# Patient Record
Sex: Male | Born: 1950
Health system: Southern US, Community
[De-identification: ages and names within clinical notes are randomized; demographics above are authoritative.]

## PROBLEM LIST (undated history)

## (undated) DIAGNOSIS — B182 Chronic viral hepatitis C: Secondary | ICD-10-CM

## (undated) DIAGNOSIS — I10 Essential (primary) hypertension: Secondary | ICD-10-CM

## (undated) DIAGNOSIS — N281 Cyst of kidney, acquired: Secondary | ICD-10-CM

## (undated) DIAGNOSIS — K7689 Other specified diseases of liver: Secondary | ICD-10-CM

## (undated) DIAGNOSIS — M1990 Unspecified osteoarthritis, unspecified site: Secondary | ICD-10-CM

## (undated) HISTORY — DX: Unspecified osteoarthritis, unspecified site: M19.90

## (undated) HISTORY — DX: Other specified diseases of liver: K76.89

## (undated) HISTORY — DX: Chronic viral hepatitis C: B18.2

## (undated) HISTORY — DX: Cyst of kidney, acquired: N28.1

## (undated) HISTORY — DX: Essential (primary) hypertension: I10

---

## 2010-09-23 HISTORY — PX: COLON SURGERY: SHX602

## 2013-12-01 DIAGNOSIS — K7689 Other specified diseases of liver: Secondary | ICD-10-CM

## 2013-12-01 DIAGNOSIS — N281 Cyst of kidney, acquired: Secondary | ICD-10-CM

## 2013-12-01 HISTORY — DX: Cyst of kidney, acquired: N28.1

## 2013-12-01 HISTORY — DX: Other specified diseases of liver: K76.89

## 2014-06-29 DIAGNOSIS — M1712 Unilateral primary osteoarthritis, left knee: Secondary | ICD-10-CM | POA: Diagnosis not present

## 2014-06-29 DIAGNOSIS — E569 Vitamin deficiency, unspecified: Secondary | ICD-10-CM | POA: Diagnosis not present

## 2014-07-06 DIAGNOSIS — E785 Hyperlipidemia, unspecified: Secondary | ICD-10-CM | POA: Diagnosis present

## 2014-07-06 DIAGNOSIS — B182 Chronic viral hepatitis C: Secondary | ICD-10-CM | POA: Diagnosis present

## 2014-07-06 DIAGNOSIS — N182 Chronic kidney disease, stage 2 (mild): Secondary | ICD-10-CM | POA: Diagnosis present

## 2014-07-06 DIAGNOSIS — E569 Vitamin deficiency, unspecified: Secondary | ICD-10-CM | POA: Diagnosis present

## 2014-07-06 DIAGNOSIS — M1712 Unilateral primary osteoarthritis, left knee: Secondary | ICD-10-CM | POA: Diagnosis not present

## 2014-07-06 DIAGNOSIS — Z86010 Personal history of colonic polyps: Secondary | ICD-10-CM | POA: Diagnosis not present

## 2014-07-06 DIAGNOSIS — I129 Hypertensive chronic kidney disease with stage 1 through stage 4 chronic kidney disease, or unspecified chronic kidney disease: Secondary | ICD-10-CM | POA: Diagnosis present

## 2014-07-06 HISTORY — PX: REPLACEMENT TOTAL KNEE: SUR1224

## 2014-07-09 DIAGNOSIS — E569 Vitamin deficiency, unspecified: Secondary | ICD-10-CM | POA: Diagnosis not present

## 2014-07-09 DIAGNOSIS — M1712 Unilateral primary osteoarthritis, left knee: Secondary | ICD-10-CM | POA: Diagnosis not present

## 2014-07-09 DIAGNOSIS — R269 Unspecified abnormalities of gait and mobility: Secondary | ICD-10-CM | POA: Diagnosis not present

## 2014-07-09 DIAGNOSIS — E785 Hyperlipidemia, unspecified: Secondary | ICD-10-CM | POA: Diagnosis not present

## 2014-07-09 DIAGNOSIS — I10 Essential (primary) hypertension: Secondary | ICD-10-CM | POA: Diagnosis not present

## 2014-07-09 DIAGNOSIS — N182 Chronic kidney disease, stage 2 (mild): Secondary | ICD-10-CM | POA: Diagnosis not present

## 2014-07-09 DIAGNOSIS — Z96652 Presence of left artificial knee joint: Secondary | ICD-10-CM | POA: Diagnosis not present

## 2014-07-09 DIAGNOSIS — D126 Benign neoplasm of colon, unspecified: Secondary | ICD-10-CM | POA: Diagnosis not present

## 2014-07-09 DIAGNOSIS — Z471 Aftercare following joint replacement surgery: Secondary | ICD-10-CM | POA: Diagnosis not present

## 2014-07-09 DIAGNOSIS — M2450 Contracture, unspecified joint: Secondary | ICD-10-CM | POA: Diagnosis not present

## 2014-07-09 DIAGNOSIS — B182 Chronic viral hepatitis C: Secondary | ICD-10-CM | POA: Diagnosis not present

## 2014-07-10 DIAGNOSIS — B182 Chronic viral hepatitis C: Secondary | ICD-10-CM | POA: Diagnosis not present

## 2014-07-10 DIAGNOSIS — Z96652 Presence of left artificial knee joint: Secondary | ICD-10-CM | POA: Diagnosis not present

## 2014-07-10 DIAGNOSIS — Z471 Aftercare following joint replacement surgery: Secondary | ICD-10-CM | POA: Diagnosis not present

## 2014-07-10 DIAGNOSIS — R269 Unspecified abnormalities of gait and mobility: Secondary | ICD-10-CM | POA: Diagnosis not present

## 2014-07-10 DIAGNOSIS — M1712 Unilateral primary osteoarthritis, left knee: Secondary | ICD-10-CM | POA: Diagnosis not present

## 2014-07-10 DIAGNOSIS — D126 Benign neoplasm of colon, unspecified: Secondary | ICD-10-CM | POA: Diagnosis not present

## 2014-07-11 DIAGNOSIS — R269 Unspecified abnormalities of gait and mobility: Secondary | ICD-10-CM | POA: Diagnosis not present

## 2014-07-11 DIAGNOSIS — Z96652 Presence of left artificial knee joint: Secondary | ICD-10-CM | POA: Diagnosis not present

## 2014-07-11 DIAGNOSIS — M1712 Unilateral primary osteoarthritis, left knee: Secondary | ICD-10-CM | POA: Diagnosis not present

## 2014-07-11 DIAGNOSIS — B182 Chronic viral hepatitis C: Secondary | ICD-10-CM | POA: Diagnosis not present

## 2014-07-11 DIAGNOSIS — Z471 Aftercare following joint replacement surgery: Secondary | ICD-10-CM | POA: Diagnosis not present

## 2014-07-11 DIAGNOSIS — D126 Benign neoplasm of colon, unspecified: Secondary | ICD-10-CM | POA: Diagnosis not present

## 2014-07-13 DIAGNOSIS — D126 Benign neoplasm of colon, unspecified: Secondary | ICD-10-CM | POA: Diagnosis not present

## 2014-07-13 DIAGNOSIS — M1712 Unilateral primary osteoarthritis, left knee: Secondary | ICD-10-CM | POA: Diagnosis not present

## 2014-07-13 DIAGNOSIS — Z96652 Presence of left artificial knee joint: Secondary | ICD-10-CM | POA: Diagnosis not present

## 2014-07-13 DIAGNOSIS — Z471 Aftercare following joint replacement surgery: Secondary | ICD-10-CM | POA: Diagnosis not present

## 2014-07-13 DIAGNOSIS — B182 Chronic viral hepatitis C: Secondary | ICD-10-CM | POA: Diagnosis not present

## 2014-07-13 DIAGNOSIS — R269 Unspecified abnormalities of gait and mobility: Secondary | ICD-10-CM | POA: Diagnosis not present

## 2014-07-14 DIAGNOSIS — Z96652 Presence of left artificial knee joint: Secondary | ICD-10-CM | POA: Diagnosis not present

## 2014-07-14 DIAGNOSIS — D126 Benign neoplasm of colon, unspecified: Secondary | ICD-10-CM | POA: Diagnosis not present

## 2014-07-14 DIAGNOSIS — M1712 Unilateral primary osteoarthritis, left knee: Secondary | ICD-10-CM | POA: Diagnosis not present

## 2014-07-14 DIAGNOSIS — Z471 Aftercare following joint replacement surgery: Secondary | ICD-10-CM | POA: Diagnosis not present

## 2014-07-14 DIAGNOSIS — B182 Chronic viral hepatitis C: Secondary | ICD-10-CM | POA: Diagnosis not present

## 2014-07-14 DIAGNOSIS — R269 Unspecified abnormalities of gait and mobility: Secondary | ICD-10-CM | POA: Diagnosis not present

## 2014-07-16 DIAGNOSIS — B182 Chronic viral hepatitis C: Secondary | ICD-10-CM | POA: Diagnosis not present

## 2014-07-16 DIAGNOSIS — D126 Benign neoplasm of colon, unspecified: Secondary | ICD-10-CM | POA: Diagnosis not present

## 2014-07-16 DIAGNOSIS — M1712 Unilateral primary osteoarthritis, left knee: Secondary | ICD-10-CM | POA: Diagnosis not present

## 2014-07-16 DIAGNOSIS — Z96652 Presence of left artificial knee joint: Secondary | ICD-10-CM | POA: Diagnosis not present

## 2014-07-16 DIAGNOSIS — Z471 Aftercare following joint replacement surgery: Secondary | ICD-10-CM | POA: Diagnosis not present

## 2014-07-16 DIAGNOSIS — R269 Unspecified abnormalities of gait and mobility: Secondary | ICD-10-CM | POA: Diagnosis not present

## 2014-07-17 DIAGNOSIS — Z471 Aftercare following joint replacement surgery: Secondary | ICD-10-CM | POA: Diagnosis not present

## 2014-07-17 DIAGNOSIS — B182 Chronic viral hepatitis C: Secondary | ICD-10-CM | POA: Diagnosis not present

## 2014-07-17 DIAGNOSIS — R269 Unspecified abnormalities of gait and mobility: Secondary | ICD-10-CM | POA: Diagnosis not present

## 2014-07-17 DIAGNOSIS — Z96652 Presence of left artificial knee joint: Secondary | ICD-10-CM | POA: Diagnosis not present

## 2014-07-17 DIAGNOSIS — D126 Benign neoplasm of colon, unspecified: Secondary | ICD-10-CM | POA: Diagnosis not present

## 2014-07-17 DIAGNOSIS — M1712 Unilateral primary osteoarthritis, left knee: Secondary | ICD-10-CM | POA: Diagnosis not present

## 2014-07-20 DIAGNOSIS — B182 Chronic viral hepatitis C: Secondary | ICD-10-CM | POA: Diagnosis not present

## 2014-07-20 DIAGNOSIS — R269 Unspecified abnormalities of gait and mobility: Secondary | ICD-10-CM | POA: Diagnosis not present

## 2014-07-20 DIAGNOSIS — M1712 Unilateral primary osteoarthritis, left knee: Secondary | ICD-10-CM | POA: Diagnosis not present

## 2014-07-20 DIAGNOSIS — Z471 Aftercare following joint replacement surgery: Secondary | ICD-10-CM | POA: Diagnosis not present

## 2014-07-20 DIAGNOSIS — D126 Benign neoplasm of colon, unspecified: Secondary | ICD-10-CM | POA: Diagnosis not present

## 2014-07-20 DIAGNOSIS — Z96652 Presence of left artificial knee joint: Secondary | ICD-10-CM | POA: Diagnosis not present

## 2014-07-22 DIAGNOSIS — M1712 Unilateral primary osteoarthritis, left knee: Secondary | ICD-10-CM | POA: Diagnosis not present

## 2014-07-22 DIAGNOSIS — B182 Chronic viral hepatitis C: Secondary | ICD-10-CM | POA: Diagnosis not present

## 2014-07-22 DIAGNOSIS — Z96652 Presence of left artificial knee joint: Secondary | ICD-10-CM | POA: Diagnosis not present

## 2014-07-22 DIAGNOSIS — D126 Benign neoplasm of colon, unspecified: Secondary | ICD-10-CM | POA: Diagnosis not present

## 2014-07-22 DIAGNOSIS — Z471 Aftercare following joint replacement surgery: Secondary | ICD-10-CM | POA: Diagnosis not present

## 2014-07-22 DIAGNOSIS — R269 Unspecified abnormalities of gait and mobility: Secondary | ICD-10-CM | POA: Diagnosis not present

## 2014-07-23 DIAGNOSIS — Z471 Aftercare following joint replacement surgery: Secondary | ICD-10-CM | POA: Diagnosis not present

## 2014-07-23 DIAGNOSIS — Z96652 Presence of left artificial knee joint: Secondary | ICD-10-CM | POA: Diagnosis not present

## 2014-07-23 DIAGNOSIS — M1712 Unilateral primary osteoarthritis, left knee: Secondary | ICD-10-CM | POA: Diagnosis not present

## 2014-07-23 DIAGNOSIS — R269 Unspecified abnormalities of gait and mobility: Secondary | ICD-10-CM | POA: Diagnosis not present

## 2014-07-23 DIAGNOSIS — D126 Benign neoplasm of colon, unspecified: Secondary | ICD-10-CM | POA: Diagnosis not present

## 2014-07-23 DIAGNOSIS — B182 Chronic viral hepatitis C: Secondary | ICD-10-CM | POA: Diagnosis not present

## 2014-07-24 DIAGNOSIS — M1712 Unilateral primary osteoarthritis, left knee: Secondary | ICD-10-CM | POA: Diagnosis not present

## 2014-07-24 DIAGNOSIS — B182 Chronic viral hepatitis C: Secondary | ICD-10-CM | POA: Diagnosis not present

## 2014-07-24 DIAGNOSIS — R269 Unspecified abnormalities of gait and mobility: Secondary | ICD-10-CM | POA: Diagnosis not present

## 2014-07-24 DIAGNOSIS — Z96652 Presence of left artificial knee joint: Secondary | ICD-10-CM | POA: Diagnosis not present

## 2014-07-24 DIAGNOSIS — Z471 Aftercare following joint replacement surgery: Secondary | ICD-10-CM | POA: Diagnosis not present

## 2014-07-24 DIAGNOSIS — D126 Benign neoplasm of colon, unspecified: Secondary | ICD-10-CM | POA: Diagnosis not present

## 2014-07-28 DIAGNOSIS — D126 Benign neoplasm of colon, unspecified: Secondary | ICD-10-CM | POA: Diagnosis not present

## 2014-07-28 DIAGNOSIS — B182 Chronic viral hepatitis C: Secondary | ICD-10-CM | POA: Diagnosis not present

## 2014-07-28 DIAGNOSIS — Z471 Aftercare following joint replacement surgery: Secondary | ICD-10-CM | POA: Diagnosis not present

## 2014-07-28 DIAGNOSIS — M1712 Unilateral primary osteoarthritis, left knee: Secondary | ICD-10-CM | POA: Diagnosis not present

## 2014-07-28 DIAGNOSIS — R269 Unspecified abnormalities of gait and mobility: Secondary | ICD-10-CM | POA: Diagnosis not present

## 2014-07-28 DIAGNOSIS — Z96652 Presence of left artificial knee joint: Secondary | ICD-10-CM | POA: Diagnosis not present

## 2014-07-30 DIAGNOSIS — R269 Unspecified abnormalities of gait and mobility: Secondary | ICD-10-CM | POA: Diagnosis not present

## 2014-07-30 DIAGNOSIS — B182 Chronic viral hepatitis C: Secondary | ICD-10-CM | POA: Diagnosis not present

## 2014-07-30 DIAGNOSIS — Z96652 Presence of left artificial knee joint: Secondary | ICD-10-CM | POA: Diagnosis not present

## 2014-07-30 DIAGNOSIS — Z471 Aftercare following joint replacement surgery: Secondary | ICD-10-CM | POA: Diagnosis not present

## 2014-07-30 DIAGNOSIS — D126 Benign neoplasm of colon, unspecified: Secondary | ICD-10-CM | POA: Diagnosis not present

## 2014-07-30 DIAGNOSIS — M1712 Unilateral primary osteoarthritis, left knee: Secondary | ICD-10-CM | POA: Diagnosis not present

## 2014-07-31 DIAGNOSIS — B182 Chronic viral hepatitis C: Secondary | ICD-10-CM | POA: Diagnosis not present

## 2014-07-31 DIAGNOSIS — M1712 Unilateral primary osteoarthritis, left knee: Secondary | ICD-10-CM | POA: Diagnosis not present

## 2014-07-31 DIAGNOSIS — D126 Benign neoplasm of colon, unspecified: Secondary | ICD-10-CM | POA: Diagnosis not present

## 2014-07-31 DIAGNOSIS — Z471 Aftercare following joint replacement surgery: Secondary | ICD-10-CM | POA: Diagnosis not present

## 2014-07-31 DIAGNOSIS — Z96652 Presence of left artificial knee joint: Secondary | ICD-10-CM | POA: Diagnosis not present

## 2014-07-31 DIAGNOSIS — R269 Unspecified abnormalities of gait and mobility: Secondary | ICD-10-CM | POA: Diagnosis not present

## 2014-08-27 DIAGNOSIS — Z01818 Encounter for other preprocedural examination: Secondary | ICD-10-CM | POA: Diagnosis not present

## 2014-08-27 DIAGNOSIS — Z131 Encounter for screening for diabetes mellitus: Secondary | ICD-10-CM | POA: Diagnosis not present

## 2015-01-04 DIAGNOSIS — Z139 Encounter for screening, unspecified: Secondary | ICD-10-CM | POA: Diagnosis not present

## 2015-01-04 DIAGNOSIS — Z96652 Presence of left artificial knee joint: Secondary | ICD-10-CM | POA: Diagnosis not present

## 2015-01-04 DIAGNOSIS — I10 Essential (primary) hypertension: Secondary | ICD-10-CM | POA: Diagnosis not present

## 2015-04-08 ENCOUNTER — Ambulatory Visit: Payer: Self-pay | Admitting: Student

## 2015-04-08 ENCOUNTER — Encounter: Payer: Self-pay | Admitting: Family Medicine

## 2015-04-08 ENCOUNTER — Ambulatory Visit (INDEPENDENT_AMBULATORY_CARE_PROVIDER_SITE_OTHER): Payer: Medicare Other | Admitting: Family Medicine

## 2015-04-08 VITALS — BP 172/99 | HR 92 | Temp 98.1°F | Ht 70.0 in | Wt 159.3 lb

## 2015-04-08 DIAGNOSIS — R1010 Upper abdominal pain, unspecified: Secondary | ICD-10-CM

## 2015-04-08 DIAGNOSIS — M129 Arthropathy, unspecified: Secondary | ICD-10-CM

## 2015-04-08 DIAGNOSIS — F1010 Alcohol abuse, uncomplicated: Secondary | ICD-10-CM | POA: Diagnosis not present

## 2015-04-08 DIAGNOSIS — I10 Essential (primary) hypertension: Secondary | ICD-10-CM

## 2015-04-08 DIAGNOSIS — M1710 Unilateral primary osteoarthritis, unspecified knee: Secondary | ICD-10-CM

## 2015-04-08 LAB — CBC WITH DIFFERENTIAL/PLATELET
BASOS ABS: 0 10*3/uL (ref 0.0–0.1)
BASOS PCT: 0 % (ref 0–1)
Eosinophils Absolute: 0.1 10*3/uL (ref 0.0–0.7)
Eosinophils Relative: 1 % (ref 0–5)
HCT: 40.7 % (ref 39.0–52.0)
HEMOGLOBIN: 14.6 g/dL (ref 13.0–17.0)
Lymphocytes Relative: 22 % (ref 12–46)
Lymphs Abs: 1.4 10*3/uL (ref 0.7–4.0)
MCH: 37.1 pg — ABNORMAL HIGH (ref 26.0–34.0)
MCHC: 35.9 g/dL (ref 30.0–36.0)
MCV: 103.3 fL — ABNORMAL HIGH (ref 78.0–100.0)
MONO ABS: 0.6 10*3/uL (ref 0.1–1.0)
MPV: 10.5 fL (ref 8.6–12.4)
Monocytes Relative: 10 % (ref 3–12)
NEUTROS ABS: 4.2 10*3/uL (ref 1.7–7.7)
NEUTROS PCT: 67 % (ref 43–77)
Platelets: 249 10*3/uL (ref 150–400)
RBC: 3.94 MIL/uL — AB (ref 4.22–5.81)
RDW: 13.1 % (ref 11.5–15.5)
WBC: 6.3 10*3/uL (ref 4.0–10.5)

## 2015-04-08 LAB — TSH: TSH: 0.964 u[IU]/mL (ref 0.350–4.500)

## 2015-04-08 NOTE — Progress Notes (Signed)
HPI:  Patient presents today for a new patient appointment to establish general primary care, also to discuss some "stomach pain".  Abdominal Pain: "Hunger pain" that has been going on for the last couple weeks. Pain is in upper abdomen. Pain is worse in morning and relieved with food. Pepto sometimes helps. He has noticed an unintentional 20 lb weight loss since March. No dark or bloody stool. Denies fever, nausea, vomiting, diarrhea, constipation.  Hypertension Patient has uncontrolled arterial hypertension. Blood pressure is not well controlled at home. He is exercising and is not adherent to a low-salt diet. He currently takes Losartan for blood pressure but he cannot remember the dose. He did not take his medication today but otherwise he claims he is compliant. Cardiac symptoms: none. Patient denies: chest pain, chest pressure/discomfort, claudication, dyspnea and exertional chest pressure/discomfort. Cardiovascular risk factors: advanced age (older than 59 for men, 55 for women), hypertension and male gender. Use of agents associated with hypertension: none. History of target organ damage: none.   ROS: Review of Systems  Constitutional: Positive for weight loss and diaphoresis. Negative for fever, chills and malaise/fatigue.  HENT: Negative for hearing loss.   Eyes: Negative for blurred vision.  Respiratory: Negative for cough, hemoptysis, shortness of breath and wheezing.   Cardiovascular: Negative for chest pain, palpitations and leg swelling.  Gastrointestinal: Positive for abdominal pain. Negative for heartburn, nausea, vomiting, diarrhea, constipation, blood in stool and melena.  Genitourinary: Negative for dysuria and frequency.  Musculoskeletal: Positive for joint pain. Negative for myalgias.  Skin: Negative for itching and rash.  Neurological: Negative for dizziness, seizures, weakness and headaches.  Psychiatric/Behavioral: Negative for depression and suicidal ideas.    Past Medical Hx:  - Hypertension - Arthritis in knees  Past Surgical Hx:  Past Surgical History  Procedure Laterality Date  . Colon surgery  2011    many colon polyps removed  . Joint replacement  July 06, 2014    Left knee replacement   Family Hx: updated in Epic  Social Hx:  - Married and lives with wife - Former cigarette smoker - Alcohol use: 3-4 vodka based drinks/day - No illicit drug use  Health Maintenance:  -Last colonoscopy was last year; pt said it was normal  PHYSICAL EXAM: BP 172/99 mmHg  Pulse 92  Temp(Src) 98.1 F (36.7 C) (Oral)  Ht 5\' 10"  (1.778 m)  Wt 159 lb 4.8 oz (72.258 kg)  BMI 22.86 kg/m2 Physical Exam  Constitutional: He is oriented to person, place, and time and well-developed, well-nourished, and in no distress.  Breath smells like alcohol  HENT:  Head: Normocephalic and atraumatic.  Right Ear: External ear normal.  Left Ear: External ear normal.  Eyes: Conjunctivae and EOM are normal. Pupils are equal, round, and reactive to light. No scleral icterus.  Neck: Normal range of motion. Neck supple. No thyromegaly present.  Cardiovascular: Normal rate, regular rhythm, normal heart sounds and intact distal pulses.  Exam reveals no gallop and no friction rub.   No murmur heard. Pulmonary/Chest: Effort normal and breath sounds normal. He has no wheezes.  Abdominal: Soft. Bowel sounds are normal. He exhibits no distension and no mass. There is no tenderness. There is no guarding.  Musculoskeletal: Normal range of motion. He exhibits no edema or tenderness.  Left knee has some swelling and mild pain when ambulating. Otherwise joints are normal.   Neurological: He is alert and oriented to person, place, and time. He displays normal reflexes. No cranial nerve  deficit. He exhibits normal muscle tone. Gait normal. Coordination normal.  Skin: Skin is warm and dry. No rash noted.  Well healed scar on abdomen, midline  Psychiatric: Mood, memory, affect  and judgment normal.    ASSESSMENT/PLAN:  Alcohol abuse Pt admits to drinking 3-4 vodka based drinks/day. Denies depression he stated he just likes the way alcohol makes him feel. Denies illicit drug use and is a previous cigarette smoker. He seems to have a good social support network with his wife and sons. Physical exam showed no hepatomegaly or scleral icterus. He may have been intoxicated during the exam as his breath smelled of alcohol. However, he is pleasant and competent in answering questions.  - Counseled patient on the dangers of alcohol abuse and encouraged him to titrate down alcohol use until eventually no alcohol at all. He expressed good understanding and willingness to quit.   Arthritis of knee S/p left knee replacement surgery on July 06, 2014. Physical exam reveals some swelling of left knee with mild tenderness but no erythema.  - Will refer to orthopedic surgery  - Ibuprofen PRN for pain  Essential hypertension Uncontrolled today. BP 172/99. Takes Losartan but cannot remember the dose. Did not take his medication this morning. No cardiac symptoms  - Obtain basic labs: CBC, CMP, Lipid Panel, and TSH - Pt will call with correct dose of medication so I can enter it into the system - Follow up in 1 month  Abdominal pain Pt has had 2 week hx of "upper" abdominal pain relieved with food and sometimes pepto bismol. Concerning symptoms include unintentional 20 lb weight loss. Also has hx of what sounds like a colon resection due to polyps. Last colonoscopy was last yr and pt states it was normal. No melena, hematochezia or hemoptysis. - Given patient's hx of colon surgery and unintentional weight loss, will refer to GI - Continue Miralax        FOLLOW UP: F/u in 1 for month for hypertension managment   Smitty Cords, MD [place], PGY-1

## 2015-04-08 NOTE — Patient Instructions (Signed)
Thank you for coming in today, it was so nice to meet you!  I would like you to get some lab work today. I will call you with the results. Also, I would like you to continue taking your blood pressure medication. I would like to see you back in 3 months.   I have also placed a referral to orthopedic surgery and a gastro doctor for your stomach pains. We will call you to schedule to appointment.   Please try to cut down the amount of alcohol your are consuming. It will make your feel a lot better. Slowly cut back on the amount you're drinking, do not stop suddenly.   Take care!  If you have any questions or concerns, please do not hesitate to call the office at [phone].  Sincerely,  Smitty Cords, MD   Alcohol Abuse and Nutrition Alcohol abuse is any pattern of alcohol consumption that harms your health, relationships, or work. Alcohol abuse can affect how your body breaks down and absorbs nutrients from food by causing your liver to work abnormally. Additionally, many people who abuse alcohol do not eat enough carbohydrates, protein, fat, vitamins, and minerals. This can cause poor nutrition (malnutrition) and a lack of nutrients (nutrient deficiencies), which can lead to further complications. Nutrients that are commonly lacking (deficient) among people who abuse alcohol include:  Vitamins.  Vitamin A. This is stored in your liver. It is important for your vision, metabolism, and ability to fight off infections (immunity).  B vitamins. These include vitamins such as folate, thiamin, and niacin. These are important in new cell growth and maintenance.  Vitamin C. This plays an important role in iron absorption, wound healing, and immunity.  Vitamin D. This is produced by your liver, but you can also get vitamin D from food. Vitamin D is necessary for your body to absorb and use calcium.  Minerals.  Calcium. This is important for your bones and your heart and blood vessel  (cardiovascular) function.  Iron. This is important for blood, muscle, and nervous system functioning.  Magnesium. This plays an important role in muscle and nerve function, and it helps to control blood sugar and blood pressure.  Zinc. This is important for the normal function of your nervous system and digestive system (gastrointestinal tract). Nutrition is an essential component of therapy for alcohol abuse. Your health care provider or dietitian will work with you to design a plan that can help restore nutrients to your body and prevent potential complications. WHAT IS MY PLAN? Your dietitian may develop a specific diet plan that is based on your condition and any other complications you may have. A diet plan will commonly include:  A balanced diet.  Grains: 6-8 oz per day.  Vegetables: 2-3 cups per day.  Fruits: 1-2 cups per day.  Meat and other protein: 5-6 oz per day.  Dairy: 2-3 cups per day.  Vitamin and mineral supplements. WHAT DO I NEED TO KNOW ABOUT ALCOHOL AND NUTRITION?  Consume foods that are high in antioxidants, such as grapes, berries, nuts, green tea, and dark green and orange vegetables. This can help to counteract some of the stress that is placed on your liver by consuming alcohol.  Avoid food and drinks that are high in fat and sugar. Foods such as sugared soft drinks, salty snack foods, and candy contain empty calories. This means that they lack important nutrients such as protein, fiber, and vitamins.  Eat frequent meals and snacks. Try to eat  5-6 small meals each day.  Eat a variety of fresh fruits and vegetables each day. This will help you get plenty of water, fiber, and vitamins in your diet.  Drink plenty of water and other clear fluids. Try to drink at least 48-64 oz (1.5-2 L) of water per day.  If you are a vegetarian, eat a variety of protein-rich foods. Pair whole grains with plant-based proteins at meals and snacks to obtain the greatest  nutrient benefit from your food. For example, eat rice with beans, put peanut butter on whole-grain toast, or eat oatmeal with sunflower seeds.  Soak beans and whole grains overnight before cooking. This can help your body to absorb the nutrients more easily.  Include foods fortified with vitamins and minerals in your diet. Commonly fortified foods include milk, orange juice, cereal, and bread.  If you are malnourished, your dietitian may recommend a high-protein, high-calorie diet. This may include:  2,000-3,000 calories (kilocalories) per day.  70-100 grams of protein per day.  Your health care provider may recommend a complete nutritional supplement beverage. This can help to restore calories, protein, and vitamins to your body. Depending on your condition, you may be advised to consume this instead of or in addition to meals.  Limit your intake of caffeine. Replace drinks like coffee and black tea with decaffeinated coffee and herbal tea.  Eat a variety of foods that are high in omega fatty acids. These include fish, nuts and seeds, and soybeans. These foods may help your liver to recover and may also stabilize your mood.  Certain medicines may cause changes in your appetite, taste, and weight. Work with your health care provider and dietitian to make any adjustments to your medicines and diet plan.  Include other healthy lifestyle choices in your daily routine.  Be physically active.  Get enough sleep.  Spend time doing activities that you enjoy.  If you are unable to take in enough food and calories by mouth, your health care provider may recommend a feeding tube. This is a tube that passes through your nose and throat, directly into your stomach. Nutritional supplement beverages can be given to you through the feeding tube to help you get the nutrients you need.  Take vitamin or mineral supplements as recommended by your health care provider. WHAT FOODS CAN I  EAT? Grains Enriched pasta. Enriched rice. Fortified whole-grain bread. Fortified whole-grain cereal. Barley. Brown rice. Quinoa. [place]. Vegetables All fresh, frozen, and canned vegetables. Spinach. Kale. Artichoke. Carrots. Winter squash and pumpkin. Sweet potatoes. Broccoli. Cabbage. Cucumbers. Tomatoes. Sweet peppers. Green beans. Peas. Corn. Fruits All fresh and frozen fruits. Berries. Grapes. Mango. Papaya. Guava. Cherries. Apples. Bananas. Peaches. Plums. Pineapple. Watermelon. Cantaloupe. Oranges. Avocado. Meats and Other Protein Sources Beef liver. Lean beef. Pork. Fresh and canned chicken. Fresh fish. Oysters. Sardines. Canned tuna. Shrimp. Eggs with yolks. Nuts and seeds. Peanut butter. Beans and lentils. Soybeans. Tofu. Dairy Whole, low-fat, and nonfat milk. Whole, low-fat, and nonfat yogurt. Cottage cheese. Sour cream. Hard and soft cheeses. Beverages Water. Herbal tea. Decaffeinated coffee. Decaffeinated green tea. 100% fruit juice. 100% vegetable juice. Instant breakfast shakes. Condiments Ketchup. Mayonnaise. Mustard. Salad dressing. Barbecue sauce. Sweets and Desserts Sugar-free ice cream. Sugar-free pudding. Sugar-free gelatin. Fats and Oils Butter. Vegetable oil, flaxseed oil, olive oil, and walnut oil. Other Complete nutrition shakes. Protein bars. Sugar-free gum. The items listed above may not be a complete list of recommended foods or beverages. Contact your dietitian for more options. WHAT FOODS ARE NOT  RECOMMENDED? Grains Sugar-sweetened breakfast cereals. Flavored instant oatmeal. Fried breads. Vegetables Breaded or deep-fried vegetables. Fruits Dried fruit with added sugar. Candied fruit. Canned fruit in syrup. Meats and Other Protein Sources Breaded or deep-fried meats. Dairy Flavored milks. Fried cheese curds or fried cheese sticks. Beverages Alcohol. Sugar-sweetened soft drinks. Sugar-sweetened tea. Caffeinated coffee and tea. Condiments Sugar. Honey.  Agave nectar. Molasses. Sweets and Desserts Chocolate. Cake. Cookies. Candy. Other Potato chips. Pretzels. Salted nuts. Candied nuts. The items listed above may not be a complete list of foods and beverages to avoid. Contact your dietitian for more information.   This information is not intended to replace advice given to you by your health care provider. Make sure you discuss any questions you have with your health care provider.   Document Released: 02/02/2005 Document Revised: 05/01/2014 Document Reviewed: 11/11/2013 Elsevier Interactive Patient Education [employer].

## 2015-04-09 ENCOUNTER — Encounter: Payer: Self-pay | Admitting: Family Medicine

## 2015-04-09 ENCOUNTER — Telehealth: Payer: Self-pay | Admitting: Family Medicine

## 2015-04-09 LAB — COMPLETE METABOLIC PANEL WITH GFR
ALT: 46 U/L (ref 9–46)
AST: 104 U/L — AB (ref 10–35)
Albumin: 4.4 g/dL (ref 3.6–5.1)
Alkaline Phosphatase: 119 U/L — ABNORMAL HIGH (ref 40–115)
BILIRUBIN TOTAL: 0.7 mg/dL (ref 0.2–1.2)
BUN: 8 mg/dL (ref 7–25)
CO2: 24 mmol/L (ref 20–31)
Calcium: 9.9 mg/dL (ref 8.6–10.3)
Chloride: 99 mmol/L (ref 98–110)
Creat: 0.87 mg/dL (ref 0.70–1.25)
GFR, Est African American: >89 mL/min (ref >=60)
GLUCOSE: 75 mg/dL (ref 65–99)
Potassium: 4.1 mmol/L (ref 3.5–5.3)
SODIUM: 135 mmol/L (ref 135–146)
TOTAL PROTEIN: 8.7 g/dL — AB (ref 6.1–8.1)

## 2015-04-09 LAB — LIPID PANEL
CHOL/HDL RATIO: 1.5 ratio (ref <=5.0)
Cholesterol: 183 mg/dL (ref 125–200)
HDL: 120 mg/dL (ref >=40)
LDL CALC: 47 mg/dL (ref <130)
Triglycerides: 81 mg/dL (ref <150)
VLDL: 16 mg/dL (ref <30)

## 2015-04-09 NOTE — Telephone Encounter (Signed)
Pt is here to provide medication information that he was unable to provide at yesterday's appointment. The pt has the bottle here and it is Losartan Potassium and Hydrochlorothiazide 50 mg tablets. He states that he is out and needs a refill. When I asked which pharmacy to have it sent to, he states that he doesn't know as he was told by the provider that there was one that he could use near [place]. Pt is new to [facility] and does not know much about pharmacy locations. I attempted to help him find one near his home but he insisted on using the pharmacy mentioned by his provider. Please advise patient at the earliest convenience. Thank you, Fonda Kinder, ASA

## 2015-04-12 ENCOUNTER — Encounter: Payer: Self-pay | Admitting: Family Medicine

## 2015-04-12 DIAGNOSIS — M1710 Unilateral primary osteoarthritis, unspecified knee: Secondary | ICD-10-CM | POA: Insufficient documentation

## 2015-04-12 DIAGNOSIS — F1010 Alcohol abuse, uncomplicated: Secondary | ICD-10-CM | POA: Insufficient documentation

## 2015-04-12 DIAGNOSIS — R109 Unspecified abdominal pain: Secondary | ICD-10-CM | POA: Insufficient documentation

## 2015-04-12 DIAGNOSIS — I10 Essential (primary) hypertension: Secondary | ICD-10-CM | POA: Insufficient documentation

## 2015-04-12 MED ORDER — LOSARTAN POTASSIUM-HCTZ 50-12.5 MG PO TABS: 1.0000 | ORAL_TABLET | Freq: Every day | ORAL | Status: DC

## 2015-04-12 MED ORDER — POLYETHYLENE GLYCOL 3350 17 GM/SCOOP PO POWD: 17.0000 g | Freq: Every day | ORAL | Status: DC

## 2015-04-12 NOTE — Assessment & Plan Note (Signed)
Pt has had 2 week hx of "upper" abdominal pain relieved with food and sometimes pepto bismol. Concerning symptoms include unintentional 20 lb weight loss. Also has hx of what sounds like a colon resection due to polyps. Last colonoscopy was last yr and pt states it was normal. No melena, hematochezia or hemoptysis. - Given patient's hx of colon surgery and unintentional weight loss, will refer to GI - Continue Miralax

## 2015-04-12 NOTE — Assessment & Plan Note (Signed)
Uncontrolled today. BP 172/99. Takes Losartan but cannot remember the dose. Did not take his medication this morning. No cardiac symptoms  - Obtain basic labs: CBC, CMP, Lipid Panel, and TSH - Pt will call with correct dose of medication so I can enter it into the system - Follow up in 1 month

## 2015-04-12 NOTE — Telephone Encounter (Signed)
Called patient and discussed BP medication, sent rx to [name] outpatient pharmacy.  Thank you.   Smitty Cords, MD [place], PGY-1

## 2015-04-12 NOTE — Assessment & Plan Note (Addendum)
S/p left knee replacement surgery on July 06, 2014. Physical exam reveals some swelling of left knee with mild tenderness but no erythema.  - Will refer to orthopedic surgery  - Ibuprofen PRN for pain

## 2015-04-12 NOTE — Assessment & Plan Note (Addendum)
Pt admits to drinking 3-4 vodka based drinks/day. Denies depression he stated he just likes the way alcohol makes him feel. Denies illicit drug use and is a previous cigarette smoker. He seems to have a good social support network with his wife and sons. Physical exam showed no hepatomegaly or scleral icterus. He may have been intoxicated during the exam as his breath smelled of alcohol. However, he is pleasant and competent in answering questions.  - Counseled patient on the dangers of alcohol abuse and encouraged him to titrate down alcohol use until eventually no alcohol at all. He expressed good understanding and willingness to quit.

## 2015-05-07 DIAGNOSIS — R1084 Generalized abdominal pain: Secondary | ICD-10-CM | POA: Diagnosis not present

## 2015-05-07 DIAGNOSIS — R112 Nausea with vomiting, unspecified: Secondary | ICD-10-CM | POA: Diagnosis not present

## 2015-05-11 ENCOUNTER — Encounter: Payer: Self-pay | Admitting: Family Medicine

## 2015-05-11 ENCOUNTER — Ambulatory Visit (INDEPENDENT_AMBULATORY_CARE_PROVIDER_SITE_OTHER): Payer: Medicare Other | Admitting: Family Medicine

## 2015-05-11 ENCOUNTER — Other Ambulatory Visit: Payer: Self-pay | Admitting: Gastroenterology

## 2015-05-11 VITALS — BP 164/89 | HR 81 | Temp 98.6°F | Ht 70.0 in | Wt 164.5 lb

## 2015-05-11 DIAGNOSIS — R1084 Generalized abdominal pain: Secondary | ICD-10-CM

## 2015-05-11 DIAGNOSIS — I10 Essential (primary) hypertension: Secondary | ICD-10-CM | POA: Diagnosis present

## 2015-05-11 DIAGNOSIS — R1010 Upper abdominal pain, unspecified: Secondary | ICD-10-CM

## 2015-05-11 DIAGNOSIS — F1010 Alcohol abuse, uncomplicated: Secondary | ICD-10-CM | POA: Diagnosis not present

## 2015-05-11 DIAGNOSIS — Z114 Encounter for screening for human immunodeficiency virus [HIV]: Secondary | ICD-10-CM

## 2015-05-11 DIAGNOSIS — Z1159 Encounter for screening for other viral diseases: Secondary | ICD-10-CM | POA: Insufficient documentation

## 2015-05-11 MED ORDER — LOSARTAN POTASSIUM-HCTZ 100-25 MG PO TABS
1.0000 | ORAL_TABLET | Freq: Every day | ORAL | Status: DC
Start: 2015-05-11 — End: 2015-06-24

## 2015-05-11 NOTE — Progress Notes (Signed)
Subjective:    Patient ID: Jeffrey [name] , male   DOB: February 21, 1951 , 65 y.o..   MRN: [id number]  HPI  Jeffrey [name] is here for follow up.  Abdominal Pain:  Still hasintermittent pain in upper abdomen, worse in the morning and relieved with food. He has been following up with GI, Jeffrey [name] (sp?). He saw GI Jan 13th and they are going to do a "upper scope". He has gained 5 lbs since last visit and no longer losing weight. Denies fever, nausea, vomiting, diarrhea. No dark of bloody stool. Admits to constipation that is relieved with Miralax.   Possible Hep C:  Discussed screening for Hep C and patient stated "Oh yea, my doc in [place] said I had hepatitis". He denies taking any medications for hepatitis at this time.    Hypertension: Patient has uncontrolled arterial hypertension. Blood pressure is not well controlled at home. He is exercising but is not adherent to a low-salt diet. He currently takes Hyzaar for blood pressure. He is compliant with this medication. Patient denies: chest pain, chest pressure/discomfort, claudication, dyspnea and exertional chest pressure/discomfort. Cardiovascular risk factors: advanced age (older than 73 for men, 57 for women), hypertension and male gender.   Alcohol Abuse:  Patient states he has been trying to cut back on alcohol consumption. He is currently drinking 2-3 6 packs of beer/day. He is motivated to quit and realizes he is damaging his liver. He does not drive, his daughter usually drives him around. He has a good family support system.   Review of Systems: Per HPI. All other systems reviewed and are negative.  Past Medical History: Patient Active Problem List   Diagnosis Date Noted  . Essential hypertension 04/12/2015  . Arthritis of knee 04/12/2015  . Alcohol abuse 04/12/2015  . Abdominal pain 04/12/2015   Medications: reviewed and updated Current Outpatient Prescriptions  Medication Sig Dispense Refill  . losartan-hydrochlorothiazide  (HYZAAR) 50-12.5 MG tablet Take 1 tablet by mouth daily. 30 tablet 3  . polyethylene glycol powder (GLYCOLAX/MIRALAX) powder Take 17 g by mouth daily. 3350 g 5   No current facility-administered medications for this visit.    SHx: Excessive alcohol consumption: 2-3 6 packs of beer a day   reports that he has quit smoking. He does not have any smokeless tobacco history on file.    Objective:   BP 164/89 mmHg  Pulse 81  Temp(Src) 98.6 F (37 C) (Oral)  Ht 5\' 10"  (1.778 m)  Wt 164 lb 8 oz (74.617 kg)  BMI 23.60 kg/m2 Physical Exam  Gen: NAD, alert, cooperative with exam, well-appearing CV: RRR, good S1/S2, no murmur, no edema, capillary refill brisk  Resp: CTABL, no wheezes, non-labored Abd: SNTND, BS present, no guarding or organomegaly Skin: no rashes, normal turgor  Neuro: no gross deficits.  Psych: good insight, alert and oriented     Assessment & Plan:  Abdominal pain Continues to have intermittent abdominal pain. Is following with GI, saw him Jan 13th and is getting a "scope". Suspect patient's pain could be due to a peptic or duodenal ulcer, possibly duodenal as pain is relieved with food. Other differentials include constipation, symptomatic cholelithiasis, or chronic pancreatitis. Continues to deny melena, hematochezia, or hemoptsysis. Abdominal exam benign.  - Will follow along with GI recommendations and findings - Continue Miralax for constipation  Alcohol abuse Continues to drink alcohol. Now says he mostly drinks beer. 3-4 6 packs of beer a day. AST on 04/08/15 was 104. - Continued  to counsel patient on dangers of alcohol abuse and encouraged him to titrate down alcohol slowly and eventually no alcohol at all. He continued to express good understanding and willingness to quit.   Essential hypertension Uncontrolled today. BP 164/89 today. Compliant with Hyzaar 50/12.5.  - Increase Hyzaar to 100/20 - Follow up in 4-6 weeks and repeat BMP then  Need for hepatitis C  screening test Patient states he has history Hepatitis C. Is not on any treatment. No records of positive Hep C.  - Test for Hep C. Patient states he does not have time today to go to the lab but will come in tomorrow for screening. Will place future order for Hep C screening. Will screen for HIV at the same time.

## 2015-05-11 NOTE — Patient Instructions (Signed)
Thank you for coming in today! Today we talked about your blood pressure, we will be increasing your dose of Hyzaar, I will send that prescription to your pharmacy. You will also get some lab work tomorrow. Please stop by the front desk and schedule a time to come in.  I would like to see you back in 4-6 weeks.   If you have any questions or concerns, please do not hesitate to call the office at [phone].  Sincerely,  Smitty Cords, MD   Hypertension Hypertension, commonly called high blood pressure, is when the force of blood pumping through your arteries is too strong. Your arteries are the blood vessels that carry blood from your heart throughout your body. A blood pressure reading consists of a higher number over a lower number, such as 110/72. The higher number (systolic) is the pressure inside your arteries when your heart pumps. The lower number (diastolic) is the pressure inside your arteries when your heart relaxes. Ideally you want your blood pressure below 120/80. Hypertension forces your heart to work harder to pump blood. Your arteries may become narrow or stiff. Having untreated or uncontrolled hypertension can cause heart attack, stroke, kidney disease, and other problems. RISK FACTORS Some risk factors for high blood pressure are controllable. Others are not.  Risk factors you cannot control include:   Race. You may be at higher risk if you are African American.  Age. Risk increases with age.  Gender. Men are at higher risk than women before age 69 years. After age 76, women are at higher risk than men. Risk factors you can control include:  Not getting enough exercise or physical activity.  Being overweight.  Getting too much fat, sugar, calories, or salt in your diet.  Drinking too much alcohol. SIGNS AND SYMPTOMS Hypertension does not usually cause signs or symptoms. Extremely high blood pressure (hypertensive crisis) may cause headache, anxiety,  shortness of breath, and nosebleed. DIAGNOSIS To check if you have hypertension, your health care provider will measure your blood pressure while you are seated, with your arm held at the level of your heart. It should be measured at least twice using the same arm. Certain conditions can cause a difference in blood pressure between your right and left arms. A blood pressure reading that is higher than normal on one occasion does not mean that you need treatment. If it is not clear whether you have high blood pressure, you may be asked to return on a different day to have your blood pressure checked again. Or, you may be asked to monitor your blood pressure at home for 1 or more weeks. TREATMENT Treating high blood pressure includes making lifestyle changes and possibly taking medicine. Living a healthy lifestyle can help lower high blood pressure. You may need to change some of your habits. Lifestyle changes may include:  Following the DASH diet. This diet is high in fruits, vegetables, and whole grains. It is low in salt, red meat, and added sugars.  Keep your sodium intake below 2,300 mg per day.  Getting at least 30-45 minutes of aerobic exercise at least 4 times per week.  Losing weight if necessary.  Not smoking.  Limiting alcoholic beverages.  Learning ways to reduce stress. Your health care provider may prescribe medicine if lifestyle changes are not enough to get your blood pressure under control, and if one of the following is true:  You are 40-76 years of age and your systolic blood pressure is above 140.  You are 65 years of age or older, and your systolic blood pressure is above 150.  Your diastolic blood pressure is above 90.  You have diabetes, and your systolic blood pressure is over [id number] or your diastolic blood pressure is over 90.  You have kidney disease and your blood pressure is above 140/90.  You have heart disease and your blood pressure is above 140/90. Your  personal target blood pressure may vary depending on your medical conditions, your age, and other factors. HOME CARE INSTRUCTIONS  Have your blood pressure rechecked as directed by your health care provider.   Take medicines only as directed by your health care provider. Follow the directions carefully. Blood pressure medicines must be taken as prescribed. The medicine does not work as well when you skip doses. Skipping doses also puts you at risk for problems.  Do not smoke.   Monitor your blood pressure at home as directed by your health care provider. SEEK MEDICAL CARE IF:   You think you are having a reaction to medicines taken.  You have recurrent headaches or feel dizzy.  You have swelling in your ankles.  You have trouble with your vision. SEEK IMMEDIATE MEDICAL CARE IF:  You develop a severe headache or confusion.  You have unusual weakness, numbness, or feel faint.  You have severe chest or abdominal pain.  You vomit repeatedly.  You have trouble breathing. MAKE SURE YOU:   Understand these instructions.  Will watch your condition.  Will get help right away if you are not doing well or get worse.   This information is not intended to replace advice given to you by your health care provider. Make sure you discuss any questions you have with your health care provider.   Document Released: 04/10/2005 Document Revised: 08/25/2014 Document Reviewed: 01/31/2013 Elsevier Interactive Patient Education [employer].

## 2015-05-11 NOTE — Assessment & Plan Note (Signed)
Continues to have intermittent abdominal pain. Is following with GI, saw him Jan 13th and is getting a "scope". Suspect patient's pain could be due to a peptic or duodenal ulcer, possibly duodenal as pain is relieved with food. Other differentials include constipation, symptomatic cholelithiasis, or chronic pancreatitis. Continues to deny melena, hematochezia, or hemoptsysis. Abdominal exam benign.  - Will follow along with GI recommendations and findings - Continue Miralax for constipation

## 2015-05-11 NOTE — Assessment & Plan Note (Signed)
Continues to drink alcohol. Now says he mostly drinks beer. 3-4 6 packs of beer a day. AST on 04/08/15 was 104. - Continued to counsel patient on dangers of alcohol abuse and encouraged him to titrate down alcohol slowly and eventually no alcohol at all. He continued to express good understanding and willingness to quit.

## 2015-05-11 NOTE — Assessment & Plan Note (Addendum)
Uncontrolled today. BP 164/89 today. Compliant with Hyzaar 50/12.5.  - Increase Hyzaar to 100/20 - Follow up in 4-6 weeks and repeat BMP then

## 2015-05-11 NOTE — Assessment & Plan Note (Signed)
Patient states he has history Hepatitis C. Is not on any treatment. No records of positive Hep C.  - Test for Hep C. Patient states he does not have time today to go to the lab but will come in tomorrow for screening. Will place future order for Hep C screening. Will screen for HIV at the same time.

## 2015-05-12 ENCOUNTER — Other Ambulatory Visit: Payer: Medicare Other

## 2015-05-12 DIAGNOSIS — Z1159 Encounter for screening for other viral diseases: Secondary | ICD-10-CM | POA: Diagnosis not present

## 2015-05-12 NOTE — Progress Notes (Signed)
Hep C antibody done today Jeffrey [name]

## 2015-05-13 LAB — HEPATITIS C ANTIBODY: HCV Ab: REACTIVE — AB

## 2015-05-14 LAB — HEPATITIS C RNA QUANTITATIVE
HCV QUANT LOG: 6.98 {Log} — AB (ref <1.18)
HCV Quantitative: 9522938 IU/mL — ABNORMAL HIGH (ref <15)

## 2015-05-17 ENCOUNTER — Ambulatory Visit
Admission: RE | Admit: 2015-05-17 | Discharge: 2015-05-17 | Disposition: A | Discharge status: Home / Self Care | Payer: Medicare Other | Source: Ambulatory Visit | Attending: Gastroenterology | Admitting: Gastroenterology

## 2015-05-17 ENCOUNTER — Telehealth: Payer: Self-pay | Admitting: Family Medicine

## 2015-05-17 DIAGNOSIS — R1084 Generalized abdominal pain: Secondary | ICD-10-CM | POA: Diagnosis not present

## 2015-05-17 DIAGNOSIS — B1920 Unspecified viral hepatitis C without hepatic coma: Secondary | ICD-10-CM

## 2015-05-17 DIAGNOSIS — R768 Other specified abnormal immunological findings in serum: Secondary | ICD-10-CM

## 2015-05-17 MED ORDER — IOPAMIDOL (ISOVUE-300) INJECTION 61%
100.0000 mL | Freq: Once | INTRAVENOUS | Status: AC | PRN
Start: 2015-05-17 — End: 2015-05-17
  Administered 2015-05-17: 100 mL via INTRAVENOUS

## 2015-05-17 NOTE — Telephone Encounter (Signed)
Called patient to inform him he tested positive for Hepatitis C. Patient states he knew he was positive, he tested positive 10 years ago but never took medication because he was afraid of the side effects. Was agreeable to an infectious disease referral to discuss treatment options.  Smitty Cords, MD [place], PGY-1

## 2015-05-31 ENCOUNTER — Other Ambulatory Visit: Payer: Medicare Other

## 2015-05-31 DIAGNOSIS — B182 Chronic viral hepatitis C: Secondary | ICD-10-CM

## 2015-05-31 LAB — CBC WITH DIFFERENTIAL/PLATELET
BASOS PCT: 1 % (ref 0–1)
Basophils Absolute: 0 10*3/uL (ref 0.0–0.1)
EOS ABS: 0.1 10*3/uL (ref 0.0–0.7)
Eosinophils Relative: 2 % (ref 0–5)
HCT: 37.8 % — ABNORMAL LOW (ref 39.0–52.0)
HEMOGLOBIN: 13 g/dL (ref 13.0–17.0)
LYMPHS ABS: 1.1 10*3/uL (ref 0.7–4.0)
Lymphocytes Relative: 27 % (ref 12–46)
MCH: 35.4 pg — AB (ref 26.0–34.0)
MCHC: 34.4 g/dL (ref 30.0–36.0)
MCV: 103 fL — AB (ref 78.0–100.0)
MONO ABS: 0.5 10*3/uL (ref 0.1–1.0)
MONOS PCT: 12 % (ref 3–12)
MPV: 10.2 fL (ref 8.6–12.4)
NEUTROS PCT: 58 % (ref 43–77)
Neutro Abs: 2.4 10*3/uL (ref 1.7–7.7)
PLATELETS: 259 10*3/uL (ref 150–400)
RBC: 3.67 MIL/uL — ABNORMAL LOW (ref 4.22–5.81)
RDW: 12.3 % (ref 11.5–15.5)
WBC: 4.2 10*3/uL (ref 4.0–10.5)

## 2015-05-31 LAB — COMPREHENSIVE METABOLIC PANEL
ALBUMIN: 4 g/dL (ref 3.6–5.1)
ALT: 45 U/L (ref 9–46)
AST: 85 U/L — ABNORMAL HIGH (ref 10–35)
Alkaline Phosphatase: 106 U/L (ref 40–115)
BUN: 12 mg/dL (ref 7–25)
CHLORIDE: 102 mmol/L (ref 98–110)
CO2: 23 mmol/L (ref 20–31)
CREATININE: 0.85 mg/dL (ref 0.70–1.25)
Calcium: 8.9 mg/dL (ref 8.6–10.3)
Glucose, Bld: 67 mg/dL (ref 65–99)
POTASSIUM: 3.8 mmol/L (ref 3.5–5.3)
SODIUM: 135 mmol/L (ref 135–146)
Total Bilirubin: 1.1 mg/dL (ref 0.2–1.2)
Total Protein: 8 g/dL (ref 6.1–8.1)

## 2015-05-31 LAB — PROTIME-INR
INR: 1.08 (ref <1.50)
PROTHROMBIN TIME: 14.1 s (ref 11.6–15.2)

## 2015-05-31 LAB — IRON: Iron: 118 ug/dL (ref 50–180)

## 2015-05-31 MED FILL — LOSARTAN-HCTZ 100-25 MG TAB: 100-25 | 90 days supply | Qty: 90 | Fill #0

## 2015-06-01 LAB — HEPATITIS B SURFACE ANTIGEN: HEP B S AG: NEGATIVE

## 2015-06-01 LAB — HEPATITIS B SURFACE ANTIBODY,QUALITATIVE: HEP B S AB: POSITIVE — AB

## 2015-06-01 LAB — ANA: ANA: NEGATIVE

## 2015-06-01 LAB — HEPATITIS A ANTIBODY, TOTAL: Hep A Total Ab: NONREACTIVE

## 2015-06-01 LAB — HIV ANTIBODY (ROUTINE TESTING W REFLEX): HIV: NONREACTIVE

## 2015-06-01 LAB — HEPATITIS B CORE ANTIBODY, TOTAL: HEP B C TOTAL AB: REACTIVE — AB

## 2015-06-03 ENCOUNTER — Emergency Department (HOSPITAL_COMMUNITY)
Admission: EM | Admit: 2015-06-03 | Discharge: 2015-06-03 | Disposition: A | Discharge status: Home / Self Care | Payer: Medicare Other | Attending: Emergency Medicine | Admitting: Emergency Medicine

## 2015-06-03 ENCOUNTER — Emergency Department (HOSPITAL_COMMUNITY): Payer: Medicare Other

## 2015-06-03 DIAGNOSIS — Z8739 Personal history of other diseases of the musculoskeletal system and connective tissue: Secondary | ICD-10-CM | POA: Diagnosis not present

## 2015-06-03 DIAGNOSIS — W1789XA Other fall from one level to another, initial encounter: Secondary | ICD-10-CM | POA: Insufficient documentation

## 2015-06-03 DIAGNOSIS — Y998 Other external cause status: Secondary | ICD-10-CM | POA: Insufficient documentation

## 2015-06-03 DIAGNOSIS — Y9389 Activity, other specified: Secondary | ICD-10-CM | POA: Diagnosis not present

## 2015-06-03 DIAGNOSIS — Y9289 Other specified places as the place of occurrence of the external cause: Secondary | ICD-10-CM | POA: Insufficient documentation

## 2015-06-03 DIAGNOSIS — Z79899 Other long term (current) drug therapy: Secondary | ICD-10-CM | POA: Diagnosis not present

## 2015-06-03 DIAGNOSIS — S8392XA Sprain of unspecified site of left knee, initial encounter: Secondary | ICD-10-CM

## 2015-06-03 DIAGNOSIS — I10 Essential (primary) hypertension: Secondary | ICD-10-CM | POA: Insufficient documentation

## 2015-06-03 DIAGNOSIS — Z87891 Personal history of nicotine dependence: Secondary | ICD-10-CM | POA: Insufficient documentation

## 2015-06-03 DIAGNOSIS — S8992XA Unspecified injury of left lower leg, initial encounter: Secondary | ICD-10-CM | POA: Diagnosis present

## 2015-06-03 DIAGNOSIS — M25462 Effusion, left knee: Secondary | ICD-10-CM

## 2015-06-03 LAB — HCV RNA,LIPA RFLX NS5A DRUG RESIST

## 2015-06-03 MED ORDER — HYDROCODONE-ACETAMINOPHEN 5-325 MG PO TABS: ORAL_TABLET | ORAL | Status: DC

## 2015-06-03 MED ORDER — HYDROCODONE-ACETAMINOPHEN 5-325 MG PO TABS
1.0000 | ORAL_TABLET | Freq: Once | ORAL | Status: AC
  Administered 2015-06-03: 1 via ORAL
  Filled 2015-06-03: qty 1

## 2015-06-03 MED FILL — HYDROCODON-APAP 5-325: 5-325 | 1 days supply | Qty: 7 | Fill #0

## 2015-06-03 NOTE — ED Notes (Signed)
Patient called in main ED waiting area with no response 

## 2015-06-03 NOTE — ED Notes (Signed)
Patient states normally took Ibuprofen 800 mg for knee pain, but states that he hasn't had to take in months until the last 2 weeks.   Patient states swelling and pain at this time.

## 2015-06-03 NOTE — Discharge Instructions (Signed)
Take vicodin for breakthrough pain, do not drink alcohol, drive, care for children or do other critical tasks while taking vicodin.  Rest, Ice intermittently (in the first 24-48 hours), Gentle compression with an Ace wrap, and elevate (Limb above the level of the heart)   Take up to 600mg  of ibuprofen (that is usually 3 over the counter pills)  3 times a day for 5 days. Take with food.  Take vicodin for breakthrough pain, do not drink alcohol, drive, care for children or do other critical tasks while taking vicodin.  Please follow with your primary care doctor in the next 2 days for a check-up. They must obtain records for further management.   Do not hesitate to return to the Emergency Department for any new, worsening or concerning symptoms.    Knee Effusion Knee effusion means that you have excess fluid in your knee joint. This can cause pain and swelling in your knee. This may make your knee more difficult to bend and move. That is because there is increased pain and pressure in the joint. If there is fluid in your knee, it often means that something is wrong inside your knee, such as severe arthritis, abnormal inflammation, or an infection. Another common cause of knee effusion is an injury to the knee muscles, ligaments, or cartilage. HOME CARE INSTRUCTIONS  Use crutches as directed by your health care provider.  Wear a knee brace as directed by your health care provider.  Apply ice to the swollen area:  Put ice in a plastic bag.  Place a towel between your skin and the bag.  Leave the ice on for 20 minutes, 2-3 times per day.  Keep your knee raised (elevated) when you are sitting or lying down.  Take medicines only as directed by your health care provider.  Do any rehabilitation or strengthening exercises as directed by your health care provider.  Rest your knee as directed by your health care provider. You may start doing your normal activities again when your health care  provider approves.   Keep all follow-up visits as directed by your health care provider. This is important. SEEK MEDICAL CARE IF:  You have ongoing (persistent) pain in your knee. SEEK IMMEDIATE MEDICAL CARE IF:  You have increased swelling or redness of your knee.  You have severe pain in your knee.  You have a fever.   This information is not intended to replace advice given to you by your health care provider. Make sure you discuss any questions you have with your health care provider.   Document Released: 07/01/2003 Document Revised: 05/01/2014 Document Reviewed: 11/24/2013 Elsevier Interactive Patient Education [employer].

## 2015-06-03 NOTE — ED Notes (Addendum)
Pt report hx of knee replacement 1 year ago. REports for the last week has been having pain and swelling to left knee for the last week. Pt reports everyday beer drinker. Denies recent injury.

## 2015-06-03 NOTE — ED Provider Notes (Signed)
CSN: [id number]     Arrival date & time 06/03/15  1207 History  By signing my name below, I, Essence Howell, attest that this documentation has been prepared under the direction and in the presence of Monico Blitz, PA-C Electronically Signed: Ladene Artist, ED Scribe 06/03/2015 at 2:06 PM.   Chief Complaint  Patient presents with  . Knee Pain    The history is provided by the patient. No language interpreter was used.   HPI Comments: Jeffrey [name] is a 65 y.o. male, with a h/o arthritis, who presents to the Emergency Department complaining of constant left knee pain for the past 1.5 weeks. Pt states that he fell off of a high bar stool 1.5 weeks ago and re-injured his knee. He reports constant pain to the area that is exacerbated with extending his knee and bearing weight, even with his cane. He further reports associated joint swelling onset last week. Pt took 3 ibuprofen tablets around 7 AM this morning without significant relief. No anticoagulant use. He reports pshx of left knee replacement in March 2016. Pt moved to [facility] in June 2016 from [place]. He states that he went to [place] for an appointment but his orthopedist was involved in a skiing accident and was not in the office. Pt has been referred to a local orthopedist by his local PCP Carlyle Dolly, MD, however, he has not been seen by ortho here since they can not obtain his records from his orthopedist in [place].   Past Medical History  Diagnosis Date  . Hypertension   . Arthritis    Past Surgical History  Procedure Laterality Date  . Colon surgery  2011    many colon polyps removed  . Joint replacement  July 06, 2014    Left knee replacement   Family History  Problem Relation Age of Onset  . Cancer Maternal Aunt    Social History  Substance Use Topics  . Smoking status: Former Research scientist (life sciences)  . Smokeless tobacco: Not on file  . Alcohol Use: 0.0 oz/week    0 Standard drinks or equivalent per week     Comment: 3-4 vodka drinks a day     Review of Systems  A complete 10 system review of systems was obtained and all systems are negative except as noted in the HPI and PMH.   Allergies  Review of patient's allergies indicates no known allergies.  Home Medications   Prior to Admission medications   Medication Sig Start Date End Date Taking? Authorizing Provider  losartan-hydrochlorothiazide (HYZAAR) 100-25 MG tablet Take 1 tablet by mouth daily. 05/11/15   Carlyle Dolly, MD  polyethylene glycol powder (GLYCOLAX/MIRALAX) powder Take 17 g by mouth daily. 04/12/15   Carlyle Dolly, MD   BP 174/99 mmHg  Pulse 78  Temp(Src) 98.2 F (36.8 C) (Oral)  Resp 20  SpO2 100% Physical Exam  Constitutional: He is oriented to person, place, and time. He appears well-developed and well-nourished. No distress.  HENT:  Head: Normocephalic and atraumatic.  Eyes: Conjunctivae are normal.  Neck: No tracheal deviation present.  Pulmonary/Chest: Effort normal.  Musculoskeletal: Normal range of motion. He exhibits edema and tenderness.  Left knee with well-healed surgical scar, there is a significant effusion. Patient has full active range of motion with moderate amount of pain. Trace warmth to the area, no overlying puncture wounds or cellulitis.  Neurological: He is alert and oriented to person, place, and time.  Skin: Skin is warm and dry.  Psychiatric: He  has a normal mood and affect. His behavior is normal.  Nursing note and vitals reviewed.  ED Course  Procedures (including critical care time) DIAGNOSTIC STUDIES: Oxygen Saturation is 100% on RA, normal by my interpretation.    COORDINATION OF CARE: 1:54 PM-Discussed treatment plan which includes XR, ace wrap, Norco and continue ibuprofen with pt at bedside and pt agreed to plan. Pt declined crutches.   Labs Review Labs Reviewed - No data to display  Imaging Review Dg Knee Complete 4 Views Left  06/03/2015  CLINICAL DATA:  Pain throughout LEFT knee with swelling  for couple weeks, no injury, LEFT knee replacement 07/06/2014, arthritis EXAM: LEFT KNEE - COMPLETE 4+ VIEW COMPARISON:  None FINDINGS: Osseous demineralization. Components of LEFT knee prosthesis identified. No periprosthetic lucency. Diffuse soft tissue swelling about LEFT knee greatest anteriorly. Large LEFT knee joint effusion. No acute fracture, dislocation, or bone destruction. IMPRESSION: Osseous demineralization with postsurgical changes of LEFT knee arthroplasty. Large knee joint effusion and significant regional soft tissue swelling greatest anteriorly. Electronically Signed   By: Lavonia Dana M.D.   On: 06/03/2015 13:42   I have personally reviewed and evaluated these images and lab results as part of my medical decision-making.   EKG Interpretation None      MDM   Final diagnoses:  Knee effusion, left  Left knee sprain, initial encounter    Filed Vitals:   06/03/15 1219 06/03/15 1304 06/03/15 1423  BP: 173/150 174/99 163/101  Pulse: 85 78 68  Temp: 98.2 F (36.8 C)    TempSrc: Oral    Resp: 18 20 16   SpO2: 100% 100% 98%    Medications  HYDROcodone-acetaminophen (NORCO/VICODIN) 5-325 MG per tablet 1 tablet (1 tablet Oral Given 06/03/15 1414)    Jeffrey [name] is 65 y.o. male presenting with left knee pain and effusion status post minor slip and fall several weeks ago. X-ray without fracture or malalignment of hardware. Patient does have a significant effusion, he has good active range of motion without significant warmth, I doubt this is a septic joint. Patient has had issues getting into see orthopedics because she's had issues obtaining his records from his [place] orthopedist. I've asked him to follow closely with primary care may be able to help him obtain records. Patient declines crutches, encouraged icing, elevation.   Evaluation does not show pathology that would require ongoing emergent intervention or inpatient treatment. Pt is hemodynamically stable and mentating  appropriately. Discussed findings and plan with patient/guardian, who agrees with care plan. All questions answered. Return precautions discussed and outpatient follow up given.   I personally performed the services described in this documentation, which was scribed in my presence. The recorded information has been reviewed and is accurate.    Monico Blitz, PA-C 06/03/15 [place], MD 06/04/15 [phone]

## 2015-06-03 NOTE — ED Notes (Signed)
Patient called again in main ED waiting area with no response; was informed by Janett Billow, RN (nurse first) that patient stated he was going down to the cafeteria to eat and he would be back; Elmyra Ricks, [place] and Amy, RN aware

## 2015-06-08 DIAGNOSIS — M25462 Effusion, left knee: Secondary | ICD-10-CM | POA: Diagnosis not present

## 2015-06-16 DIAGNOSIS — M25462 Effusion, left knee: Secondary | ICD-10-CM | POA: Diagnosis not present

## 2015-06-23 ENCOUNTER — Ambulatory Visit (INDEPENDENT_AMBULATORY_CARE_PROVIDER_SITE_OTHER): Payer: Medicare Other | Admitting: Family Medicine

## 2015-06-23 ENCOUNTER — Encounter: Payer: Self-pay | Admitting: Family Medicine

## 2015-06-23 VITALS — BP 134/77 | HR 89 | Temp 98.3°F | Ht 70.0 in | Wt 169.5 lb

## 2015-06-23 DIAGNOSIS — R768 Other specified abnormal immunological findings in serum: Secondary | ICD-10-CM

## 2015-06-23 DIAGNOSIS — I10 Essential (primary) hypertension: Secondary | ICD-10-CM | POA: Diagnosis present

## 2015-06-23 DIAGNOSIS — R894 Abnormal immunological findings in specimens from other organs, systems and tissues: Secondary | ICD-10-CM

## 2015-06-23 DIAGNOSIS — B182 Chronic viral hepatitis C: Secondary | ICD-10-CM

## 2015-06-23 DIAGNOSIS — F1010 Alcohol abuse, uncomplicated: Secondary | ICD-10-CM | POA: Diagnosis not present

## 2015-06-23 DIAGNOSIS — Z0000 Encounter for general adult medical examination without abnormal findings: Secondary | ICD-10-CM | POA: Diagnosis not present

## 2015-06-23 NOTE — Progress Notes (Signed)
   Subjective:    Patient ID: Jeffrey [name] , male   DOB: 1950/07/29 , 65 y.o..   MRN: [id number]  HPI  Jeffrey [name] is here for follow up.  Hypertension: Controlled when he takes his BP at home. Pt is compliant with Hyzaar medication regimen. Has not noticed any side effects. Denies any blurry vision, headache, claudication, or chest pain.   Alcohol Abuse: Alcohol consumption down to 1 pint of vodka/week per patient. States that he realizes he needs to get his "life in order" and take care of his liver. Patient states that he has been gradually decreasing his alcohol consumption over the last few weeks instead of abruptly stopping. His appetite and mood have improved since decreasing alcohol intake. Denies any tremors or seizure like activity.     Hepatitis C: Patient has an appointment with an infectious disease physician on 3/15. Denies any abdominal pain, nausea, vomiting, diarrhea, constipation or edema.    Review of Systems: Per HPI. All other systems reviewed and are negative.  Past Medical History: Patient Active Problem List   Diagnosis Date Noted  . Hepatitis C 06/23/2015  . Essential hypertension 04/12/2015  . Arthritis of knee 04/12/2015  . Alcohol abuse 04/12/2015  . Abdominal pain 04/12/2015    Medications: reviewed and updated  Social Hx:  reports that he has quit smoking. He does not have any smokeless tobacco history on file.  Alcohol consumption as stated above    Objective:   BP 134/77 mmHg  Pulse 89  Temp(Src) 98.3 F (36.8 C) (Oral)  Ht 5\' 10"  (1.778 m)  Wt 169 lb 8 oz (76.885 kg)  BMI 24.32 kg/m2 Physical Exam  Constitutional: He is oriented to person, place, and time and well-developed, well-nourished, and in no distress. No distress.  HENT:  Head: Normocephalic and atraumatic.  Eyes: Pupils are equal, round, and reactive to light. Scleral icterus (mild) is present.  Cardiovascular: Normal rate, regular rhythm, normal heart sounds and intact  distal pulses.   Pulmonary/Chest: Effort normal and breath sounds normal.  Abdominal: Soft. Bowel sounds are normal. He exhibits no distension. There is no tenderness.  Neurological: He is alert and oriented to person, place, and time. Gait normal.  Skin: Skin is warm and dry. He is not diaphoretic.  Psychiatric: Mood, memory, affect and judgment normal.   Assessment & Plan:  Essential hypertension Controlled. BP 134/71 today. Compliant with Hyzaar 100-25 mg daily. CMP on 2/6 WNL except for elevated AST. - Continue current therapy, will send refill today - Follow up in 3 months  Hepatitis C HCV Ab positive with high viral quant on 05/12/15 labs. Likely chronic. Also of note, appears as though patient was infected with Hep B at some point but infection has resolved as there is no Hep B Antigen detected. No concerning symptoms of hepatic failure at this time. AST 85 on 2/6 labs.  - Appointment with Dr. Linus Salmons (ID specialist) on 07/07/15  Alcohol abuse Improving per patient. Down to 1 pint/week. Does not smell of alcohol as he did at last appointment. He appears highly motivated to quit and feel better.  - Discussed importance of continuing to decrease alcohol consumption to eventually have no alcohol at all.  - Will follow up in 3 months or sooner as needed

## 2015-06-23 NOTE — Patient Instructions (Signed)
Thank you for coming in today, it was so nice to see you!  You are doing great with your blood pressure and getting your alcohol under control. Please continue to wean off alcohol. You will need to have a colonoscopy, I will put in a referral for you.   I would like to see you back in 3 months.   If you have any questions or concerns, please do not hesitate to call the office at [phone].  Sincerely,  Smitty Cords, MD   Hepatitis C Hepatitis C is a viral infection of the liver. It can lead to scarring of the liver (cirrhosis), liver failure, or liver cancer. Hepatitis C may go undetected for months or years because people with the infection may not have symptoms, or they may have only mild symptoms. CAUSES  Hepatitis C is caused by the hepatitis C virus (HCV). The virus can be passed from one person to another through:  Blood.  Contaminated needles, such as those used for tattooing, body piercing, acupuncture, or injecting drugs.  Having unprotected sex with an infected person.  Childbirth.  Blood transfusions or organ transplants done in the [place] before 1992. RISK FACTORS Risk factors for hepatitis C include:  Having unprotected sex with an infected person.  Using illegal drugs. SIGNS AND SYMPTOMS  Symptoms of hepatitis C may include:  Fatigue.  Loss of appetite.  Nausea.  Vomiting.  Abdominal pain.  Dark yellow urine.  Yellowish skin and eyes (jaundice).  Itching of the skin.  Clay-colored bowel movements.  Joint pain. Symptoms are not always present.  DIAGNOSIS  Hepatitis C is diagnosed with blood tests. Other types of tests may also be done to check how your liver is functioning. TREATMENT  Your health care provider may perform noninvasive tests or a liver biopsy to help determine the best course of treatment. Treatment for hepatitis C may include one or more medicines. Your health care provider may check you for a recurring infection  or other liver conditions every 6-12 months after treatment. HOME CARE INSTRUCTIONS   Rest as needed.  Take all medicines as directed by your health care provider.  Do not take any medicine unless approved by your health care provider. This includes over-the-counter medicine and birth control pills.  Do not drink alcohol.  Do not have sex until approved by your health care provider.  Do not share toothbrushes, nail clippers, razors, or needles. PREVENTION There is no vaccine for hepatitis C. The only way to prevent the disease is to reduce the risk of exposure to the virus. This may be done by:  Practicing safe sex and using condoms.  Avoiding illegal drugs. SEEK MEDICAL CARE IF:  You have a fever.  You develop abdominal pain.  You develop dark urine.  You have clay-colored bowel movements.  You develop joint pains. SEEK IMMEDIATE MEDICAL CARE IF:  You have increasing fatigue or weakness.  You lose your appetite.  You feel nauseous or vomit.  You develop jaundice or your jaundice gets worse.  You bruise or bleed easily. MAKE SURE YOU:   Understand these instructions.  Will watch your condition.  Will get help right away if you are not doing well or get worse.   This information is not intended to replace advice given to you by your health care provider. Make sure you discuss any questions you have with your health care provider.   Document Released: 04/07/2000 Document Revised: 05/01/2014 Document Reviewed: 07/23/2013 Elsevier Interactive Patient Education  2016 [employer].

## 2015-06-24 MED ORDER — LOSARTAN POTASSIUM-HCTZ 100-25 MG PO TABS: 1.0000 | ORAL_TABLET | Freq: Every day | ORAL | Status: DC

## 2015-06-24 NOTE — Assessment & Plan Note (Addendum)
Controlled. BP 134/71 today. Compliant with Hyzaar 100-25 mg daily. CMP on 2/6 WNL except for elevated AST. - Continue current therapy, will send refill today - Follow up in 3 months

## 2015-06-24 NOTE — Assessment & Plan Note (Addendum)
HCV Ab positive with high viral quant on 05/12/15 labs. Likely chronic. Also of note, appears as though patient was infected with Hep B at some point but infection has resolved as there is no Hep B Antigen detected. No concerning symptoms of hepatic failure at this time. AST 85 on 2/6 labs.  - Appointment with Dr. Linus Salmons (ID specialist) on 07/07/15

## 2015-06-24 NOTE — Assessment & Plan Note (Addendum)
Improving per patient. Down to 1 pint/week. Does not smell of alcohol as he did at last appointment. He appears highly motivated to quit and feel better.  - Discussed importance of continuing to decrease alcohol consumption to eventually have no alcohol at all.  - Will follow up in 3 months or sooner as needed

## 2015-07-07 ENCOUNTER — Ambulatory Visit (INDEPENDENT_AMBULATORY_CARE_PROVIDER_SITE_OTHER): Payer: Medicare Other | Admitting: Internal Medicine

## 2015-07-07 ENCOUNTER — Encounter: Payer: Self-pay | Admitting: Internal Medicine

## 2015-07-07 VITALS — BP 149/94 | HR 84 | Temp 98.6°F | Ht 70.0 in | Wt 166.0 lb

## 2015-07-07 DIAGNOSIS — B182 Chronic viral hepatitis C: Secondary | ICD-10-CM

## 2015-07-07 MED ORDER — LEDIPASVIR-SOFOSBUVIR 90-400 MG PO TABS: 1.0000 | ORAL_TABLET | Freq: Every day | ORAL | Status: DC

## 2015-07-07 NOTE — Patient Instructions (Signed)
Date 07/07/2015  Dear Mr. Coniglio, As discussed in the [facility], your hepatitis C therapy will include the following medications:          [name] 90mg /400mg  tablet:           Take 1 tablet by mouth once daily   Please note that ALL MEDICATIONS WILL START ON THE SAME DATE for a total of 12 weeks. ---------------------------------------------------------------- Your HCV Treatment Start Date: TBA   Your HCV genotype:  1b    Liver Fibrosis: TBD    ---------------------------------------------------------------- YOUR PHARMACY CONTACT:   [place] [facility] [place] Phone: [phone] Hours: Monday to Friday 7:30 am to 6:00 pm   Please always contact your pharmacy at least 3-4 business days before you run out of medications to ensure your next month's medication is ready or 1 week prior to running out if you receive it by mail.  Remember, each prescription is for 28 days. ---------------------------------------------------------------- GENERAL NOTES REGARDING YOUR HEPATITIS C MEDICATION:  SOFOSBUVIR/LEDIPASVIR ([name]): - [name] tablet is taken daily with OR without food. - The tablets are orange. - The tablets should be stored at room temperature.  - Acid reducing agents such as H2 blockers (ie. Pepcid (famotidine), Zantac (ranitidine), Tagamet (cimetidine), Axid (nizatidine) and proton pump inhibitors (ie. Prilosec (omeprazole), Protonix (pantoprazole), Nexium (esomeprazole), or Aciphex (rabeprazole)) can decrease effectiveness of [name]. Do not take until you have discussed with a health care provider.    -Antacids that contain magnesium and/or aluminum hydroxide (ie. Milk of Magensia, Rolaids, Gaviscon, Maalox, Mylanta, an dArthritis Pain Formula)can reduce absorption of [name], so take them at least 4 hours before or after [name].  -Calcium carbonate (calcium supplements or antacids such as Tums, Caltrate,  Os-Cal)needs to be taken at least 4 hours hours before or after [name].  -St. John's wort or any products that contain St. John's wort like some herbal supplements  Please inform the office prior to starting any of these medications.  - The common side effects associated with [name] include:      1. Fatigue      2. Headache      3. Nausea      4. Diarrhea      5. Insomnia  Please note that this only lists the most common side effects and is NOT a comprehensive list of the potential side effects of these medications. For more information, please review the drug information sheets that come with your medication package from the pharmacy.  ---------------------------------------------------------------- GENERAL HELPFUL HINTS ON HCV THERAPY: 1. Stay well-hydrated. 2. Notify the ID Clinic of any changes in your other over-the-counter/herbal or prescription medications. 3. If you miss a dose of your medication, take the missed dose as soon as you remember. Return to your regular time/dose schedule the next day.  4.  Do not stop taking your medications without first talking with your healthcare provider. 5.  You may take Tylenol (acetaminophen), as long as the dose is less than 2000 mg (OR no more than 4 tablets of the Tylenol Extra Strengths 500mg  tablet) in 24 hours. 6.  You will see our pharmacist-specialist within the first 2 weeks of starting your medication. 7.  You will need to obtain routine labs around week 4 and12 weeks after starting and then 3 to 6 months after finishing [name].    Scharlene Gloss, [place] [facility] [place],  [place]  [id number] [phone]

## 2015-07-07 NOTE — Progress Notes (Signed)
[facility]   CC: consideration for treatment for chronic hepatitis C  HPI:  +Jeffrey [name] is a 65 y.o. male who presents for initial evaluation and management of chronic hepatitis C.  Patient tested positive over 10 years ago, but did not pursue treatment due to side effects. Hepatitis C-associated risk factors present are none. Patient denies intranasal drug use, IV drug abuse. Patient has had other studies performed. Results: hepatitis C RNA by PCR, result: positive. Patient has not had prior treatment for Hepatitis C. Patient does not have a past history of liver disease. Patient does not have a family history of liver disease. Patient does not  have associated signs or symptoms related to liver disease.  Labs reviewed and confirm chronic hepatitis C with a positive viral load.   Records reviewed from PCP, has htn.  Does drink regularly and knows to stop.      Patient does not have documented immunity to Hepatitis A. Patient does have documented immunity to Hepatitis B.    Review of Systems:   Constitutional: negative for fatigue and malaise Gastrointestinal: negative for diarrhea Musculoskeletal: negative for myalgias and arthralgias All other systems reviewed and are negative      Past Medical History  Diagnosis Date  . Hypertension   . Arthritis     Prior to Admission medications   Medication Sig Start Date End Date Taking? Authorizing Provider  losartan-hydrochlorothiazide (HYZAAR) 100-25 MG tablet Take 1 tablet by mouth daily. 06/24/15  Yes Carlyle Dolly, MD  polyethylene glycol powder (GLYCOLAX/MIRALAX) powder Take 17 g by mouth daily. 04/12/15  Yes Carlyle Dolly, MD  Ledipasvir-Sofosbuvir ([name]) 90-400 MG TABS Take 1 tablet by mouth daily. 07/07/15   Thayer Headings, MD    No Known Allergies  Social History  Substance Use Topics  . Smoking status: Former Research scientist (life sciences)  . Smokeless tobacco: Never Used  . Alcohol Use: 25.2 oz/week     42 Standard drinks or equivalent per week     Comment: 3-4 vodka drinks a day    Family History  Problem Relation Age of Onset  . Cancer Maternal Aunt   no cirrhosis, no liver cancer   Objective:  Constitutional: in no apparent distress and alert,  Filed Vitals:   07/07/15 1429  BP: 149/94  Pulse: 84  Temp: 98.6 F (37 C)   Eyes: anicteric Cardiovascular: Cor RRR and No murmurs Respiratory: CTA B; normal respiratory effort Gastrointestinal: Bowel sounds are normal, liver is not enlarged, spleen is not enlarged Musculoskeletal: peripheral pulses normal, no pedal edema, no clubbing or cyanosis Skin: negatives: no rash; no porphyria cutanea tarda Lymphatic: no cervical lymphadenopathy   Laboratory Genotype: No results found for: HCVGENOTYPE HCV viral load:  Lab Results  Component Value Date   HCVQUANT [id number]* 05/12/2015   Lab Results  Component Value Date   WBC 4.2 05/31/2015   HGB 13.0 05/31/2015   HCT 37.8* 05/31/2015   MCV 103.0* 05/31/2015   PLT 259 05/31/2015    Lab Results  Component Value Date   CREATININE 0.85 05/31/2015   BUN 12 05/31/2015   NA 135 05/31/2015   K 3.8 05/31/2015   CL 102 05/31/2015   CO2 23 05/31/2015    Lab Results  Component Value Date   ALT 45 05/31/2015   AST 85* 05/31/2015   ALKPHOS 106 05/31/2015     Labs and history reviewed and show CHILD-PUGH A  5-6 points: Child class A 7-9 points: Child  class B 10-15 points: Child class C  Lab Results  Component Value Date   INR 1.08 05/31/2015   BILITOT 1.1 05/31/2015   ALBUMIN 4.0 05/31/2015     Assessment: New Patient with Chronic Hepatitis C genotype 1b, untreated.  I discussed with the patient the lab findings that confirm chronic hepatitis C as well as the natural history and progression of disease including about 30% of people who develop cirrhosis of the liver if left untreated and once cirrhosis is established there is a 2-7% risk per year of liver cancer and liver  failure.  I discussed the importance of treatment and benefits in reducing the risk, even if significant liver fibrosis exists.   Plan: 1) Patient counseled extensively on limiting acetaminophen to no more than 2 grams daily, avoidance of alcohol. 2) Transmission discussed with patient including sexual transmission, sharing razors and toothbrush.   3) Will need referral to gastroenterology if concern for cirrhosis 4) Will need referral for substance abuse counseling: No.; Further work up to include urine drug screen  No.  I did though counsel him on stopping alcohol completely.   5) Will prescribe [name] for 12 weeks 6) Hepatitis A vaccine Yes.   7) Hepatitis B vaccine No. 8) Pneumovax vaccine if concern for cirrhosis 9) Further work up to include liver staging with elastography 10) will follow up after starting medication

## 2015-08-04 ENCOUNTER — Ambulatory Visit (HOSPITAL_COMMUNITY): Payer: Medicare Other

## 2015-08-09 ENCOUNTER — Encounter: Payer: Self-pay | Admitting: Family Medicine

## 2015-08-11 ENCOUNTER — Ambulatory Visit (INDEPENDENT_AMBULATORY_CARE_PROVIDER_SITE_OTHER): Payer: Medicare Other | Admitting: Family Medicine

## 2015-08-11 ENCOUNTER — Encounter: Payer: Self-pay | Admitting: Family Medicine

## 2015-08-11 VITALS — BP 149/85 | HR 73 | Temp 98.2°F | Wt 170.4 lb

## 2015-08-11 DIAGNOSIS — I10 Essential (primary) hypertension: Secondary | ICD-10-CM | POA: Diagnosis not present

## 2015-08-11 DIAGNOSIS — B182 Chronic viral hepatitis C: Secondary | ICD-10-CM | POA: Diagnosis not present

## 2015-08-11 DIAGNOSIS — F1010 Alcohol abuse, uncomplicated: Secondary | ICD-10-CM

## 2015-08-11 NOTE — Assessment & Plan Note (Addendum)
Worsening. Now drinking 1/2 pint of vodka every 2 days. Still highly motivated to quit - Discussed risks of increase alcohol consumption and concern about increased consumption. - Provided patient with information for [facility] for psychotherapy and substance abuse treatment. Patient will call today. - Information handout on alcohol abuse provided.

## 2015-08-11 NOTE — Assessment & Plan Note (Addendum)
Uncontrolled today. BP 149/85. Likely due to stress from recent death of wife. - Continue Hyzaar 100-25mg  daily - Follow up in 3 months

## 2015-08-11 NOTE — Progress Notes (Signed)
   Subjective:    Patient ID: Jeffrey [name] , male   DOB: March 25, 1951 , 65 y.o..   MRN: [id number]  HPI  Raunak Antuna is here for follow up.  Death in the family:  Patient's wife recently passed away on 07/30/2022. He states that she was in and out of the hospital for awhile and she passed at [facility].  Patient states him and his wife used to drink a lot of alcohol together. He thinks she passed because of her alcohol use.  As expected patient is still grieving but is functional.  Understandably, he has not been on top of his medications or his health recently.    Hypertension: Blood pressure at home: Has not been recording. Is planning on getting a BP cuff.  Blood pressure today: 149/85 Meds: Compliant with Hyzaar Side effects: none ROS: Denies headache, dizziness, visual changes, nausea, vomiting, chest pain, abdominal pain or shortness of breath.  Hepatitis C:  Has met with ID and was prescribed [name]. Has not taken [name] yet because he did not know the medicine was ready for him at the pharmacy.  He said he will go to the pharmacy after this appointment to get the medicine.  Alcohol Abuse:  Continues to drink alcohol.  Will drink a 1/2 pint of vodka every 2 days.  States that he is still trying to decrease his intake.  Is interested in receiving assistance for quitting.  Feels as though he may not be able to quit on his own.   Review of Systems: Per HPI. All other systems reviewed and are negative.  Medications: reviewed and updated Current Outpatient Prescriptions  Medication Sig Dispense Refill  . Ledipasvir-Sofosbuvir ([name]) 90-400 MG TABS Take 1 tablet by mouth daily. 28 tablet 2  . losartan-hydrochlorothiazide (HYZAAR) 100-25 MG tablet Take 1 tablet by mouth daily. 90 tablet 1  . polyethylene glycol powder (GLYCOLAX/MIRALAX) powder Take 17 g by mouth daily. 3350 g 5   No current facility-administered medications for this visit.    Social Hx:  reports  that he has quit smoking. He has never used smokeless tobacco. Continues to drink alcohol (see above)    Objective:   BP 149/85 mmHg  Pulse 73  Temp(Src) 98.2 F (36.8 C) (Oral)  Wt 170 lb 6.4 oz (77.293 kg) Physical Exam  Gen: NAD, alert, cooperative with exam, well-appearing Cardiac: Regular rate and rhythm, normal S1/S2, no murmur, no edema, capillary refill brisk  Respiratory: Clear to auscultation bilaterally, no wheezes, non-labored Psych: good insight, normal mood and affect  Assessment & Plan:  Alcohol abuse Worsening. Now drinking 1/2 pint of vodka every 2 days. Still highly motivated to quit - Discussed risks of increase alcohol consumption and concern about increased consumption. - Provided patient with information for [facility] for psychotherapy and substance abuse treatment. Patient will call today. - Information handout on alcohol abuse provided.   Chronic hepatitis C without hepatic coma ([place]) Has seen Infectious disease specialist last month and placed on [name]. Has not picked up prescription yet as he was in and out of the hospital last month for his wife and then she passed away on 07-30-2022.  - Patient will pick up prescription for [name] today - Continue to follow with ID  Essential hypertension Uncontrolled today. BP 149/85. Likely due to stress from recent death of wife. - Continue Hyzaar 100-'25mg'$  daily - Follow up in 3 months    Follow up: 3 months

## 2015-08-11 NOTE — Assessment & Plan Note (Addendum)
Has seen Infectious disease specialist last month and placed on [name]. Has not picked up prescription yet as he was in and out of the hospital last month for his wife and then she passed away on 08-18-2022.  - Patient will pick up prescription for [name] today - Continue to follow with ID

## 2015-08-11 NOTE — Patient Instructions (Addendum)
Thank you for coming to see me today. It was a pleasure.   Today we talked about your alcohol consumption. I have printed out a place for you to call: [facility]. Please call them to discuss ways to stop drinking.   Please make an appointment to see me in 3 months for follow up. You can make this appointment at the front desk before you leave.   If you have any questions or concerns, please do not hesitate to call the office at [phone].  Sincerely,  Smitty Cords, MD   Alcohol Use Disorder Alcohol use disorder is a mental disorder. It is not a one-time incident of heavy drinking. Alcohol use disorder is the excessive and uncontrollable use of alcohol over time that leads to problems with functioning in one or more areas of daily living. People with this disorder risk harming themselves and others when they drink to excess. Alcohol use disorder also can cause other mental disorders, such as mood and anxiety disorders, and serious physical problems. People with alcohol use disorder often misuse other drugs.  Alcohol use disorder is common and widespread. Some people with this disorder drink alcohol to cope with or escape from negative life events. Others drink to relieve chronic pain or symptoms of mental illness. People with a family history of alcohol use disorder are at higher risk of losing control and using alcohol to excess.  Drinking too much alcohol can cause injury, accidents, and health problems. One drink can be too much when you are:  Working.  Pregnant or breastfeeding.  Taking medicines. Ask your doctor.  Driving or planning to drive. SYMPTOMS  Signs and symptoms of alcohol use disorder may include the following:   Consumption ofalcohol inlarger amounts or over a longer period of time than intended.  Multiple unsuccessful attempts to cutdown or control alcohol use.   A great deal of time spent obtaining alcohol, using alcohol, or recovering from the  effects of alcohol (hangover).  A strong desire or urge to use alcohol (cravings).   Continued use of alcohol despite problems at work, school, or home because of alcohol use.   Continued use of alcohol despite problems in relationships because of alcohol use.  Continued use of alcohol in situations when it is physically hazardous, such as driving a car.  Continued use of alcohol despite awareness of a physical or psychological problem that is likely related to alcohol use. Physical problems related to alcohol use can involve the brain, heart, liver, stomach, and intestines. Psychological problems related to alcohol use include intoxication, depression, anxiety, psychosis, delirium, and dementia.   The need for increased amounts of alcohol to achieve the same desired effect, or a decreased effect from the consumption of the same amount of alcohol (tolerance).  Withdrawal symptoms upon reducing or stopping alcohol use, or alcohol use to reduce or avoid withdrawal symptoms. Withdrawal symptoms include:  Racing heart.  Hand tremor.  Difficulty sleeping.  Nausea.  Vomiting.  Hallucinations.  Restlessness.  Seizures. DIAGNOSIS Alcohol use disorder is diagnosed through an assessment by your health care provider. Your health care provider may start by asking three or four questions to screen for excessive or problematic alcohol use. To confirm a diagnosis of alcohol use disorder, at least two symptoms must be present within a 67-month period. The severity of alcohol use disorder depends on the number of symptoms:  Mild--two or three.  Moderate--four or five.  Severe--six or more. Your health care provider may perform a  physical exam or use results from lab tests to see if you have physical problems resulting from alcohol use. Your health care provider may refer you to a mental health professional for evaluation. TREATMENT  Some people with alcohol use disorder are able to reduce  their alcohol use to low-risk levels. Some people with alcohol use disorder need to quit drinking alcohol. When necessary, mental health professionals with specialized training in substance use treatment can help. Your health care provider can help you decide how severe your alcohol use disorder is and what type of treatment you need. The following forms of treatment are available:   Detoxification. Detoxification involves the use of prescription medicines to prevent alcohol withdrawal symptoms in the first week after quitting. This is important for people with a history of symptoms of withdrawal and for heavy drinkers who are likely to have withdrawal symptoms. Alcohol withdrawal can be dangerous and, in severe cases, cause death. Detoxification is usually provided in a hospital or in-patient substance use treatment facility.  Counseling or talk therapy. Talk therapy is provided by substance use treatment counselors. It addresses the reasons people use alcohol and ways to keep them from drinking again. The goals of talk therapy are to help people with alcohol use disorder find healthy activities and ways to cope with life stress, to identify and avoid triggers for alcohol use, and to handle cravings, which can cause relapse.  Medicines.Different medicines can help treat alcohol use disorder through the following actions:  Decrease alcohol cravings.  Decrease the positive reward response felt from alcohol use.  Produce an uncomfortable physical reaction when alcohol is used (aversion therapy).  Support groups. Support groups are run by people who have quit drinking. They provide emotional support, advice, and guidance. These forms of treatment are often combined. Some people with alcohol use disorder benefit from intensive combination treatment provided by specialized substance use treatment centers. Both inpatient and outpatient treatment programs are available.   This information is not intended  to replace advice given to you by your health care provider. Make sure you discuss any questions you have with your health care provider.   Document Released: 05/18/2004 Document Revised: 05/01/2014 Document Reviewed: 07/18/2012 Elsevier Interactive Patient Education [employer].

## 2015-10-04 MED FILL — LOSARTAN-HCTZ 100-25 MG TAB: 100-25 | 90 days supply | Qty: 90 | Fill #0

## 2015-11-08 ENCOUNTER — Ambulatory Visit (INDEPENDENT_AMBULATORY_CARE_PROVIDER_SITE_OTHER): Payer: Medicare Other | Admitting: Family Medicine

## 2015-11-08 ENCOUNTER — Encounter: Payer: Self-pay | Admitting: Family Medicine

## 2015-11-08 VITALS — BP 135/86 | HR 90 | Temp 98.6°F | Ht 70.0 in | Wt 174.0 lb

## 2015-11-08 DIAGNOSIS — F1010 Alcohol abuse, uncomplicated: Secondary | ICD-10-CM

## 2015-11-08 DIAGNOSIS — B182 Chronic viral hepatitis C: Secondary | ICD-10-CM

## 2015-11-08 DIAGNOSIS — I10 Essential (primary) hypertension: Secondary | ICD-10-CM

## 2015-11-08 MED ORDER — LOSARTAN POTASSIUM-HCTZ 100-25 MG PO TABS: 1.0000 | ORAL_TABLET | Freq: Every day | ORAL | Status: DC

## 2015-11-08 NOTE — Patient Instructions (Signed)
Thank you for coming in today, it was so nice to see you. Today we talked about:   1. Blood pressure: Your blood pressure looks perfect 2. Alcohol and Hepatitis: Please continue to cut down on the amount of alcohol you are drinking as this can cause your liver to fail and cause death. If you need any contact information for a 12 step program please call me and let me know.   Your infectious disease doctor's info is Dr. Scharlene Gloss: Address: [address], [place], [place] [address] Phone: [phone]  Please follow up in 3 months. You can schedule this appointment at the front desk before you leave or call the clinic.  If you have any questions or concerns, please do not hesitate to call the office at [phone]. You can also message me directly via MyChart.   Sincerely,  Smitty Cords, MD

## 2015-11-08 NOTE — Progress Notes (Signed)
   Subjective:    Patient ID: Jeffrey [name] , male   DOB: 1950-08-08 , 65 y.o..   MRN: [id number]  HPI  Jeffrey [name] is here for follow up.  Hypertension: Blood pressure at home: Does not check Exercise: Minimal Low salt diet: Non adherent Meds: Compliant with Losartan-HCTZ Side effects: None ROS: Denies headache, dizziness, visual changes, nausea, vomiting, chest pain, abdominal pain or shortness of breath.  Hepatitis C:  Patient has not picked up [name] medication because he is unable to afford it He wants to get in contact with his ID doctor but he forgot who that was and cannot find the contact info  Alcohol Abuse:  Continues to drink alcohol almost daily Will drink 12 pack of beer "every other day" Has not reached out to any 12 step programs as he says he doesn't need their help and he can do it on his own Not interested in any other help right now in quitting alcohol  Review of Systems: Per HPI. All other systems reviewed and are negative.  Medications: reviewed and updated  Social Hx:  reports that he has quit smoking. He has never used smokeless tobacco.    Objective:   BP 135/86 mmHg  Pulse 90  Temp(Src) 98.6 F (37 C) (Oral)  Ht 5\' 10"  (1.778 m)  Wt 174 lb (78.926 kg)  BMI 24.97 kg/m2  SpO2 97% Physical Exam  Gen: NAD, alert, cooperative with exam, well-appearing HEENT: NCAT, PERRL,icteric sclera, oropharynx clear, supple neck Cardiac: Regular rate and rhythm, normal S1/S2, no murmur, no edema, capillary refill brisk  Respiratory: Clear to auscultation bilaterally, no wheezes, non-labored Gastrointestinal: soft, non tender, non distended, bowel sounds present Psych: good insight, normal mood and affect   Assessment & Plan:  Essential hypertension Controlled and at goal per JNC-8 guidelines.  - Continue Hyzaar 100-25 mg daily - Follow up in 3 months  Chronic hepatitis C without hepatic coma ([facility]) Untreated so far. Has been seen by ID but patient  unable to afford [name] medication. Discussed calling ID office and seeing if there is an alternative therapy or payment option.  - Provided patient with his ID doctor's office contact information - Will continue to follow along  Alcohol abuse Persistent. Now drinking 12 pack of beer q 2 days. Now not motivated or interested in quitting at this time. Discussed risks of alcohol abuse and the risks of developing hepatic failure/hepatic carcinoma.  - Offered to provide info for centers to help with alcohol abuse, patient politely declined - Will continue to encourage patient to stop abusing alcohol

## 2015-11-08 NOTE — Assessment & Plan Note (Signed)
Controlled and at goal per JNC-8 guidelines.  - Continue Hyzaar 100-25 mg daily - Follow up in 3 months

## 2015-11-08 NOTE — Assessment & Plan Note (Signed)
Untreated so far. Has been seen by ID but patient unable to afford [name] medication. Discussed calling ID office and seeing if there is an alternative therapy or payment option.  - Provided patient with his ID doctor's office contact information - Will continue to follow along

## 2015-11-08 NOTE — Assessment & Plan Note (Signed)
Persistent. Now drinking 12 pack of beer q 2 days. Now not motivated or interested in quitting at this time. Discussed risks of alcohol abuse and the risks of developing hepatic failure/hepatic carcinoma.  - Offered to provide info for centers to help with alcohol abuse, patient politely declined - Will continue to encourage patient to stop abusing alcohol

## 2015-11-30 DIAGNOSIS — S8002XA Contusion of left knee, initial encounter: Secondary | ICD-10-CM | POA: Diagnosis not present

## 2015-12-20 ENCOUNTER — Encounter: Payer: Self-pay | Admitting: Family Medicine

## 2016-01-20 ENCOUNTER — Ambulatory Visit (INDEPENDENT_AMBULATORY_CARE_PROVIDER_SITE_OTHER): Payer: Medicare Other | Admitting: Family Medicine

## 2016-01-20 ENCOUNTER — Encounter: Payer: Self-pay | Admitting: Family Medicine

## 2016-01-20 VITALS — BP 155/89 | HR 66 | Temp 98.4°F | Ht 70.0 in | Wt 176.0 lb

## 2016-01-20 DIAGNOSIS — R42 Dizziness and giddiness: Secondary | ICD-10-CM | POA: Diagnosis not present

## 2016-01-20 DIAGNOSIS — H8190 Unspecified disorder of vestibular function, unspecified ear: Secondary | ICD-10-CM

## 2016-01-20 DIAGNOSIS — H8120 Vestibular neuronitis, unspecified ear: Secondary | ICD-10-CM

## 2016-01-20 DIAGNOSIS — F1010 Alcohol abuse, uncomplicated: Secondary | ICD-10-CM

## 2016-01-20 DIAGNOSIS — R2689 Other abnormalities of gait and mobility: Secondary | ICD-10-CM

## 2016-01-20 DIAGNOSIS — B182 Chronic viral hepatitis C: Secondary | ICD-10-CM

## 2016-01-20 DIAGNOSIS — I10 Essential (primary) hypertension: Secondary | ICD-10-CM

## 2016-01-20 MED ORDER — MECLIZINE HCL 25 MG PO TABS: 25.0000 mg | ORAL_TABLET | Freq: Every day | ORAL | 3 refills | Status: DC | PRN

## 2016-01-20 MED ORDER — LOSARTAN POTASSIUM-HCTZ 100-25 MG PO TABS: 1.0000 | ORAL_TABLET | Freq: Every day | ORAL | 2 refills | Status: DC

## 2016-01-20 MED FILL — MECLIZINE 25 MG TABLET: 25 | 30 days supply | Qty: 30 | Fill #0

## 2016-01-20 NOTE — Assessment & Plan Note (Signed)
Uncontrolled today, however has been controlled at home. We'll continue current medication regimen of Hyzaar 100-25 milligrams daily. Follow-up in 3 months

## 2016-01-20 NOTE — Patient Instructions (Addendum)
Thank you for coming in today, it was so nice to see you! Today we talked about:    Vertigo: we cleaned out your ears. We will also do some lab work in the mean time to try and see if theres anything abnormal. Please take 1 multivitamin daily.  You can take a 1/2 tablet of Meclizine as needed every 6 hours when you have vertigo.  If your vertigo gets worse or you pass out, please seek medical attention.   Please follow up in 1 month. You can schedule this appointment at the front desk before you leave or call the clinic.  Bring in all your medications or supplements to each appointment for review.   If we ordered any tests today, you will be notified via telephone of any abnormalities. If everything is normal you will get a letter in the mail.   If you have any questions or concerns, please do not hesitate to call the office at [phone]. You can also message me directly via MyChart.   Sincerely,  Smitty Cords, MD

## 2016-01-20 NOTE — Assessment & Plan Note (Signed)
Has seen ID in the past but could not afford [name] medication so he is still untreated. Discussed importance of treating hepatitis C with patient including avoiding hepatic carcinoma. Instructed patient to contact ID doctors office and discussed barrier to treatment. Hopefully they may be able to assist him. We'll continue to follow along.

## 2016-01-20 NOTE — Assessment & Plan Note (Addendum)
Patient with a new vertigo, highly suspicious for BPPV. Physical exam unremarkable except for a small piece of hair close to his right tympanic membrane. Ordered ear irrigation to see if this helped him. While nurse was performing ear irrigation he developed episode of vertigo and was unable to walk straight. Laid patient on exam table and gave him some water, episode resolved in 1 minute. At this time will start meclizine; half tablet of 25 mg every 6 hours when necessary. Will order B12, folate and CMP. Discussed keeping well-hydrated and moving head around frequently. Discussed red flag symptoms and return precautions. Also discussed not to drive during these episodes. Follow-up in one month.

## 2016-01-20 NOTE — Progress Notes (Signed)
Subjective:    Patient ID: Jeffrey [name] , male   DOB: 01/01/1951 , 65 y.o..   MRN: [id number]  HPI  Jeffrey [name] is here for follow up.  Vertigo Patient states that for the last week he has felt his "head spinning ". A couple of these episodes happened while he was driving and it made him really scared. He feels like his "equilibrium is off " and trouble with balance. At times he also has some ear ringing bilaterally, no trouble hearing. This has never happened before.[name] He denies ever passing out or losing consciousness. Denies any headaches, visual changes, falls, seizure-like activity. Admits to some consistent tingling in bilateral fingertips which started around the time of these vertigo episodes. Admits that he has trouble keeping hydrated. Sometimes he will drink 4 cups of water a day but usually less. Of note he did get a new glasses prescription last week and felt like this may have contributed to his symptoms so he started using his old glasses. Has not tried any new medications.  Hypertension Blood pressure at home: Check checks blood pressures sporadically, yesterday it was 137/98. Exercise: Minimal Low salt diet: Compliant Medications: Compliant with Hyzaar Side effects: None ROS: Denies headache, visual changes, nausea, vomiting, chest pain, abdominal pain or shortness of breath. BP Readings from Last 3 Encounters:  01/20/16 (!) 155/89  11/08/15 135/86  08/11/15 (!) 149/85   Alcohol Abuse Patient states that he has not had any alcohol in the last week. No withdrawal symptoms.  Hep C He has still not unable to pick up the [name] medication due to cost.   Review of Systems: Per HPI. All other systems reviewed and are negative.  Past Medical History: Patient Active Problem List   Diagnosis Date Noted  . Episodic recurrent vertigo 01/20/2016  . Chronic hepatitis C without hepatic coma ([place]) 06/23/2015  . Essential hypertension 04/12/2015  . Arthritis of knee  04/12/2015  . Alcohol abuse 04/12/2015  . Abdominal pain 04/12/2015    Medications: reviewed and updated Current Outpatient Prescriptions  Medication Sig Dispense Refill  . Ledipasvir-Sofosbuvir ([name]) 90-400 MG TABS Take 1 tablet by mouth daily. (Patient not taking: Reported on 08/11/2015) 28 tablet 2  . losartan-hydrochlorothiazide (HYZAAR) 100-25 MG tablet Take 1 tablet by mouth daily. 90 tablet 2  . meclizine (ANTIVERT) 25 MG tablet Take 1 tablet (25 mg total) by mouth daily as needed. 30 tablet 3  . polyethylene glycol powder (GLYCOLAX/MIRALAX) powder Take 17 g by mouth daily. (Patient not taking: Reported on 11/08/2015) 3350 g 5   No current facility-administered medications for this visit.     Social Hx:  reports that he has quit smoking. He has never used smokeless tobacco.    Objective:   BP (!) 155/89 (BP Location: Right Arm, Patient Position: Sitting, Cuff Size: Normal)   Pulse 66   Temp 98.4 F (36.9 C) (Oral)   Ht 5\' 10"  (1.778 m)   Wt 176 lb (79.8 kg)   BMI 25.25 kg/m  Physical Exam  Gen: NAD, alert, cooperative with exam, well-appearing HEENT: NCAT, PERRL, clear conjunctiva, oropharynx clear, supple neck, left tympanic membrane normal without cerumen, right ear canal without cerumen but with small hair resting against tympanic membrane. Cardiac: Regular rate and rhythm, normal S1/S2, no murmur, no edema, capillary refill brisk  Respiratory: Clear to auscultation bilaterally, no wheezes, non-labored breathing Gastrointestinal: soft, non tender, non distended, bowel sounds present Skin: no rashes, normal turgor  Neurological: Alert and oriented 3, Cranial  nerves II through XII intact, 5 out of 5 strength of bilateral upper and lower extremities, sensation intact throughout, 2+ biceps reflex bilaterally, normal gait Psych: good insight, normal mood and affect   Assessment & Plan:  Essential hypertension Uncontrolled today, however has been controlled at home.  We'll continue current medication regimen of Hyzaar 100-25 milligrams daily. Follow-up in 3 months  Chronic hepatitis C without hepatic coma ([facility]) Has seen ID in the past but could not afford [name] medication so he is still untreated. Discussed importance of treating hepatitis C with patient including avoiding hepatic carcinoma. Instructed patient to contact ID doctors office and discussed barrier to treatment. Hopefully they may be able to assist him. We'll continue to follow along.  Alcohol abuse Patient has not had any alcohol in one week. Congratulated patient on this accomplishment. Will continue to monitor alcohol intake and hopefully he will avoid alcohol. Continues to politely decline info for centers to help with alcoholics.  Episodic recurrent vertigo Patient with a new vertigo, highly suspicious for BPPV. Physical exam unremarkable except for a small piece of hair close to his right tympanic membrane. Ordered ear irrigation to see if this helped him. While nurse was performing ear irrigation he developed episode of vertigo and was unable to walk straight. Laid patient on exam table and gave him some water, episode resolved in 1 minute. At this time will start meclizine; half tablet of 25 mg every 6 hours when necessary. Will order B12, folate and CMP. Discussed keeping well-hydrated and moving head around frequently. Discussed red flag symptoms and return precautions. Also discussed not to drive during these episodes. Follow-up in one month.   Smitty Cords, MD [place], PGY-2

## 2016-01-20 NOTE — Assessment & Plan Note (Signed)
Patient has not had any alcohol in one week. Congratulated patient on this accomplishment. Will continue to monitor alcohol intake and hopefully he will avoid alcohol. Continues to politely decline info for centers to help with alcoholics.

## 2016-01-21 ENCOUNTER — Other Ambulatory Visit: Payer: Medicare Other

## 2016-01-21 DIAGNOSIS — R2689 Other abnormalities of gait and mobility: Secondary | ICD-10-CM | POA: Diagnosis not present

## 2016-01-21 DIAGNOSIS — R42 Dizziness and giddiness: Secondary | ICD-10-CM | POA: Diagnosis not present

## 2016-01-21 LAB — COMPLETE METABOLIC PANEL WITH GFR
ALBUMIN: 4.6 g/dL (ref 3.6–5.1)
ALK PHOS: 74 U/L (ref 40–115)
ALT: 31 U/L (ref 9–46)
AST: 49 U/L — AB (ref 10–35)
BILIRUBIN TOTAL: 1.2 mg/dL (ref 0.2–1.2)
BUN: 11 mg/dL (ref 7–25)
CO2: 23 mmol/L (ref 20–31)
CREATININE: 1.04 mg/dL (ref 0.70–1.25)
Calcium: 9.8 mg/dL (ref 8.6–10.3)
Chloride: 100 mmol/L (ref 98–110)
GFR, Est African American: 88 mL/min (ref >=60)
GFR, Est Non African American: 76 mL/min (ref >=60)
GLUCOSE: 88 mg/dL (ref 65–99)
Potassium: 4.3 mmol/L (ref 3.5–5.3)
SODIUM: 135 mmol/L (ref 135–146)
TOTAL PROTEIN: 8.7 g/dL — AB (ref 6.1–8.1)

## 2016-01-21 LAB — FOLATE: Folate: 16.2 ng/mL (ref >5.4)

## 2016-01-21 LAB — VITAMIN B12: Vitamin B-12: 411 pg/mL (ref 200–1100)

## 2016-01-21 MED FILL — LOSARTAN-HCTZ 100-25 MG TAB: 100-25 | 90 days supply | Qty: 90 | Fill #0

## 2016-01-26 ENCOUNTER — Encounter: Payer: Self-pay | Admitting: Family Medicine

## 2016-02-16 ENCOUNTER — Ambulatory Visit (INDEPENDENT_AMBULATORY_CARE_PROVIDER_SITE_OTHER): Payer: Medicare Other | Admitting: Family Medicine

## 2016-02-16 DIAGNOSIS — H8190 Unspecified disorder of vestibular function, unspecified ear: Secondary | ICD-10-CM

## 2016-02-16 DIAGNOSIS — J069 Acute upper respiratory infection, unspecified: Secondary | ICD-10-CM

## 2016-02-16 DIAGNOSIS — H8120 Vestibular neuronitis, unspecified ear: Secondary | ICD-10-CM

## 2016-02-16 DIAGNOSIS — R42 Dizziness and giddiness: Secondary | ICD-10-CM

## 2016-02-16 MED ORDER — MECLIZINE HCL 25 MG PO TABS: 25.0000 mg | ORAL_TABLET | Freq: Every day | ORAL | 3 refills | Status: DC | PRN

## 2016-02-16 MED FILL — MECLIZINE 25 MG TABLET: 25 | 30 days supply | Qty: 30 | Fill #0

## 2016-02-16 NOTE — Patient Instructions (Addendum)
Thank you for coming in today, it was so nice to see you! Today we talked about:    Vertigo: I have refilled urinary meclizine please continue taking as prescribed It seems like you may have a virus: drink plenty of clear fluids, rest, drink warm tea with honey and return to office if symptoms persist or worsen.  Please follow up in 3 months. You can schedule this appointment at the front desk before you leave or call the clinic.  If you have any questions or concerns, please do not hesitate to call the office at [phone]. You can also message me directly via MyChart.   Sincerely,  Smitty Cords, MD   Benign Positional Vertigo Vertigo is the feeling that you or your surroundings are moving when they are not. Benign positional vertigo is the most common form of vertigo. The cause of this condition is not serious (is benign). This condition is triggered by certain movements and positions (is positional). This condition can be dangerous if it occurs while you are doing something that could endanger you or others, such as driving.  CAUSES In many cases, the cause of this condition is not known. It may be caused by a disturbance in an area of the inner ear that helps your brain to sense movement and balance. This disturbance can be caused by a viral infection (labyrinthitis), head injury, or repetitive motion. RISK FACTORS This condition is more likely to develop in:  Women.  People who are 33 years of age or older. SYMPTOMS Symptoms of this condition usually happen when you move your head or your eyes in different directions. Symptoms may start suddenly, and they usually last for less than a minute. Symptoms may include:  Loss of balance and falling.  Feeling like you are spinning or moving.  Feeling like your surroundings are spinning or moving.  Nausea and vomiting.  Blurred vision.  Dizziness.  Involuntary eye movement (nystagmus). Symptoms can be mild and cause only  slight annoyance, or they can be severe and interfere with daily life. Episodes of benign positional vertigo may return (recur) over time, and they may be triggered by certain movements. Symptoms may improve over time. DIAGNOSIS This condition is usually diagnosed by medical history and a physical exam of the head, neck, and ears. You may be referred to a health care provider who specializes in ear, nose, and throat (ENT) problems (otolaryngologist) or a provider who specializes in disorders of the nervous system (neurologist). You may have additional testing, including:  MRI.  A CT scan.  Eye movement tests. Your health care provider may ask you to change positions quickly while he or she watches you for symptoms of benign positional vertigo, such as nystagmus. Eye movement may be tested with an electronystagmogram (ENG), caloric stimulation, the Dix-Hallpike test, or the roll test.  An electroencephalogram (EEG). This records electrical activity in your brain.  Hearing tests. TREATMENT Usually, your health care provider will treat this by moving your head in specific positions to adjust your inner ear back to normal. Surgery may be needed in severe cases, but this is rare. In some cases, benign positional vertigo may resolve on its own in 2-4 weeks. HOME CARE INSTRUCTIONS Safety  Move slowly.Avoid sudden body or head movements.  Avoid driving.  Avoid operating heavy machinery.  Avoid doing any tasks that would be dangerous to you or others if a vertigo episode would occur.  If you have trouble walking or keeping your balance, try using  a cane for stability. If you feel dizzy or unstable, sit down right away.  Return to your normal activities as told by your health care provider. Ask your health care provider what activities are safe for you. General Instructions  Take over-the-counter and prescription medicines only as told by your health care provider.  Avoid certain positions or  movements as told by your health care provider.  Drink enough fluid to keep your urine clear or pale yellow.  Keep all follow-up visits as told by your health care provider. This is important. SEEK MEDICAL CARE IF:  You have a fever.  Your condition gets worse or you develop new symptoms.  Your family or friends notice any behavioral changes.  Your nausea or vomiting gets worse.  You have numbness or a "pins and needles" sensation. SEEK IMMEDIATE MEDICAL CARE IF:  You have difficulty speaking or moving.  You are always dizzy.  You faint.  You develop severe headaches.  You have weakness in your legs or arms.  You have changes in your hearing or vision.  You develop a stiff neck.  You develop sensitivity to light.   This information is not intended to replace advice given to you by your health care provider. Make sure you discuss any questions you have with your health care provider.   Document Released: 01/16/2006 Document Revised: 12/30/2014 Document Reviewed: 08/03/2014 Elsevier Interactive Patient Education [employer].

## 2016-02-16 NOTE — Progress Notes (Signed)
   Subjective:    Patient ID: Jeffrey [name] , male   DOB: Dec 13, 1950 , 65 y.o..   MRN: [id number]  HPI  Jeffrey [name] is here for   Vertigo follow up Patient states that since he has started meclizine he has not had any episodes like he had before. She denies any syncope, dizziness, vertigo, loss of consciousness. Denies any ringing in his ears although he states sometimes he will hear a buzzing noise in his left or right ear but this rarely happens. He only takes meclizine once a day. Admits to continued use of alcohol, he states that he will drink about a 12 pack day which he thinks is actually improved from what feels doing before.  URI Also complains of of congestion and productive cough for 7 days. He denies a history of chest pain, fatigue, fevers, nausea, shortness of breath, vomiting and weight loss and denies a history of asthma. Patient denies smoke cigarettes.  Review of Systems: Per HPI. All other systems reviewed and are negative.   Medications: reviewed and updated Current Outpatient Prescriptions  Medication Sig Dispense Refill  . Ledipasvir-Sofosbuvir ([name]) 90-400 MG TABS Take 1 tablet by mouth daily. (Patient not taking: Reported on 08/11/2015) 28 tablet 2  . losartan-hydrochlorothiazide (HYZAAR) 100-25 MG tablet Take 1 tablet by mouth daily. 90 tablet 2  . meclizine (ANTIVERT) 25 MG tablet Take 1 tablet (25 mg total) by mouth daily as needed. 30 tablet 3  . polyethylene glycol powder (GLYCOLAX/MIRALAX) powder Take 17 g by mouth daily. (Patient not taking: Reported on 11/08/2015) 3350 g 5   No current facility-administered medications for this visit.     Social Hx:  reports that he has quit smoking. He has never used smokeless tobacco.    Objective:   BP 138/68 (BP Location: Right Arm, Cuff Size: Normal)   Pulse 77   Temp 98.6 F (37 C) (Oral)   Ht 5\' 10"  (1.778 m)   Wt 178 lb (80.7 kg)   SpO2 99%   BMI 25.54 kg/m  Physical Exam  Gen: NAD, alert,  cooperative with exam, well-appearing HEENT: NCAT, PERRL, clear conjunctiva, oropharynx clear, supple neck, mild rhinorrhea, TM normal bilaterally Cardiac: Regular rate and rhythm, normal S1/S2, no murmur, no edema, capillary refill brisk  Respiratory: Clear to auscultation bilaterally, no wheezes, non-labored breathing, cough Gastrointestinal: soft, non tender, non distended, bowel sounds present Psych: good insight, normal mood and affect   Assessment & Plan:  Acute upper respiratory infection Likely viral. Patient afebrile with normal vitals. Symptomatic therapy suggested: push fluids, rest and return office visit prn if symptoms persist or worsen. Lack of antibiotic effectiveness discussed with him. He will call or return to clinic prn if these symptoms worsen or fail to improve as anticipated.   Episodic recurrent vertigo Improved with Meclizine. Continue current therapy. He is using Meclizine once daily. Will return to clinic if symptoms worsen. Additionally, concerned that some of patient symptoms may be related to alcohol use. He is drinking a 12 pack of beer a day. Continued to encourage gradual alcohol cessation. Again, discussed harmful effects of alcoholism. Patient still not agreeable to having help and would like to do it on his own.    Smitty Cords, MD [place], PGY-2

## 2016-02-20 DIAGNOSIS — J069 Acute upper respiratory infection, unspecified: Secondary | ICD-10-CM | POA: Insufficient documentation

## 2016-02-20 NOTE — Assessment & Plan Note (Signed)
Likely viral. Patient afebrile with normal vitals. Symptomatic therapy suggested: push fluids, rest and return office visit prn if symptoms persist or worsen. Lack of antibiotic effectiveness discussed with him. He will call or return to clinic prn if these symptoms worsen or fail to improve as anticipated.

## 2016-02-20 NOTE — Assessment & Plan Note (Addendum)
Improved with Meclizine. Continue current therapy. He is using Meclizine once daily. Will return to clinic if symptoms worsen. Additionally, concerned that some of patient symptoms may be related to alcohol use. He is drinking a 12 pack of beer a day. Continued to encourage gradual alcohol cessation. Again, discussed harmful effects of alcoholism. Patient still not agreeable to having help and would like to do it on his own.

## 2016-04-25 DIAGNOSIS — S8002XD Contusion of left knee, subsequent encounter: Secondary | ICD-10-CM | POA: Diagnosis not present

## 2016-04-25 DIAGNOSIS — G8929 Other chronic pain: Secondary | ICD-10-CM | POA: Diagnosis not present

## 2016-04-25 DIAGNOSIS — M5442 Lumbago with sciatica, left side: Secondary | ICD-10-CM | POA: Diagnosis not present

## 2016-04-26 MED FILL — METHYLPREDNISOLONE 4 MG TAB: 4 | 6 days supply | Qty: 21 | Fill #0

## 2016-05-01 DIAGNOSIS — M5442 Lumbago with sciatica, left side: Secondary | ICD-10-CM | POA: Diagnosis not present

## 2016-05-03 DIAGNOSIS — M5442 Lumbago with sciatica, left side: Secondary | ICD-10-CM | POA: Diagnosis not present

## 2016-05-18 ENCOUNTER — Ambulatory Visit (INDEPENDENT_AMBULATORY_CARE_PROVIDER_SITE_OTHER): Payer: Medicare Other | Admitting: Family Medicine

## 2016-05-18 ENCOUNTER — Encounter: Payer: Self-pay | Admitting: Family Medicine

## 2016-05-18 VITALS — BP 126/84 | HR 77 | Temp 98.0°F | Ht 70.0 in | Wt 182.2 lb

## 2016-05-18 DIAGNOSIS — F1010 Alcohol abuse, uncomplicated: Secondary | ICD-10-CM | POA: Diagnosis not present

## 2016-05-18 DIAGNOSIS — R42 Dizziness and giddiness: Secondary | ICD-10-CM

## 2016-05-18 DIAGNOSIS — I10 Essential (primary) hypertension: Secondary | ICD-10-CM

## 2016-05-18 DIAGNOSIS — H8120 Vestibular neuronitis, unspecified ear: Secondary | ICD-10-CM

## 2016-05-18 DIAGNOSIS — H8190 Unspecified disorder of vestibular function, unspecified ear: Secondary | ICD-10-CM

## 2016-05-18 DIAGNOSIS — N529 Male erectile dysfunction, unspecified: Secondary | ICD-10-CM | POA: Insufficient documentation

## 2016-05-18 DIAGNOSIS — B182 Chronic viral hepatitis C: Secondary | ICD-10-CM

## 2016-05-18 DIAGNOSIS — Z9049 Acquired absence of other specified parts of digestive tract: Secondary | ICD-10-CM

## 2016-05-18 MED ORDER — SILDENAFIL CITRATE 100 MG PO TABS: 50.0000 mg | ORAL_TABLET | Freq: Every day | ORAL | 3 refills | Status: DC | PRN

## 2016-05-18 MED ORDER — LOSARTAN POTASSIUM-HCTZ 100-25 MG PO TABS: 1.0000 | ORAL_TABLET | Freq: Every day | ORAL | 2 refills | Status: DC

## 2016-05-18 MED ORDER — MECLIZINE HCL 25 MG PO TABS: 25.0000 mg | ORAL_TABLET | Freq: Every day | ORAL | 3 refills | Status: DC | PRN

## 2016-05-18 MED FILL — SILDENAFIL 100 MG TABLET: 100 | 2 days supply | Qty: 1 | Fill #0

## 2016-05-18 NOTE — Assessment & Plan Note (Signed)
Patient had a right partial colectomy in June 2012 in [place]. He notes at that time his GI doctor told him that he needed a colonoscopy every 2 years. He notes that his last colonoscopy was in 2013. He is not having any abdominal pain at this time and seems to be stooling appropriately. -Referral for GI placed

## 2016-05-18 NOTE — Assessment & Plan Note (Signed)
Unclear etiology. No signs of prostate issues based on history. Patient deferred genital exam at this time. -Gave prescription for 1 Viagra pill as patient reports he cannot afford more than this, we'll continue to follow along

## 2016-05-18 NOTE — Assessment & Plan Note (Signed)
Controlled and at goal. Goal BP less than 140/90. -Continue Hyzaar as prescribed -Return 3 months

## 2016-05-18 NOTE — Patient Instructions (Addendum)
Thank you for coming in today, it was so nice to see you! Today we talked about:    Blood pressure: continue taking your medications as prescribed  Vertigo: We have refilled you medication  Erection problems: I have sent a prescription for viagra to your pharmacy  Colon: I have placed a referral for you to see a stomach doctor  Please follow up in 3 months. You can schedule this appointment at the front desk before you leave or call the clinic.  If we ordered any tests today, you will be notified via telephone of any abnormalities. If everything is normal you will get a letter in the mail.   If you have any questions or concerns, please do not hesitate to call the office at [phone]. You can also message me directly via MyChart.   Sincerely,  Smitty Cords, MD

## 2016-05-18 NOTE — Assessment & Plan Note (Signed)
Symptoms controlled with meclizine. Symptoms have also greatly improved after patient stopped drinking alcohol. -Refilled for meclizine provided

## 2016-05-18 NOTE — Assessment & Plan Note (Signed)
Chronic. Untreated. Patient still has not seen the infectious disease doctor. He notes that he will call the infectious disease office at a later time .There appears to be no barriers to him seeing  the infectious disease doctor he just does not want to follow up. Again emphasized importance of calling infectious disease doctor and scheduling an appointment as soon as possible.

## 2016-05-18 NOTE — Progress Notes (Signed)
Subjective:    Patient ID: Jeffrey [name] , male   DOB: Sep 18, 1950 , 66 y.o..   MRN: [id number]  HPI  Jeffrey [name] is here for  Chief Complaint  Patient presents with  . office visit   Hypertension Blood pressure at home: Does not check Exercise: planning on riding his bicycle next week after he gets it fixed. Has not been very physically active recently. He states that this is due to the cold weather. Low salt diet: Non-compliant Medications: Compliant with Hyzaar Side effects: None ROS: Denies headache, dizziness, visual changes, nausea, vomiting, chest pain, abdominal pain or shortness of breath. BP Readings from Last 3 Encounters:  05/18/16 126/84  02/16/16 138/68  01/20/16 (!) 155/89   Vertigo: Patient notes that he has not had any vertigo symptoms recently. He does take the meclizine every now and then as needed. He denies any loss of consciousness, falls, dizziness, numbness, tingling. Denies any headaches or change in vision.  Erectile dysfunction: Patient states that over the last 4-6 months he has had difficulty getting an erection. He notes that he does not have difficulty ejaculating. He admits that sometimes he does wake up in the morning with an erection. He notes that he has not been very sexually active recently but he would like to be sexually active with a new girlfriend that he has. He denies any difficulty urinating, denies any dribbling.   History of Right partial colectomy: Patient  states that he has had a partial colectomy years ago. He notes that he last saw a GI doctor in [place] and was told that he needed a colonoscopy every couple years. Patient notes that his last colonoscopy was in 2013, he denies any abnormalities from this. I do not have the results of his last colonoscopy in Epic.   Hepatitis C: Patient states that he has not seen the infectious disease doctor recently. He notes that he never called them to schedule follow-up appointment. He has not  been on any treatment. He was previously prescribed her body but never pick that prescription up.  Alcohol abuse: Patient states that he has stopped drinking alcohol over the last couple weeks. He notes that he has been doing well. Denies any seizures, weakness, fatigue. He notes that he stop drinking because he met a new woman in his life and she told him that he needed to.  Review of Systems: Per HPI. All other systems reviewed and are negative.   Past Medical History: Patient Active Problem List   Diagnosis Date Noted  . History of partial colectomy 05/18/2016  . Erectile dysfunction 05/18/2016  . Episodic recurrent vertigo 01/20/2016  . Chronic hepatitis C without hepatic coma ([place]) 06/23/2015  . Essential hypertension 04/12/2015  . Arthritis of knee 04/12/2015  . Alcohol abuse 04/12/2015  . Abdominal pain 04/12/2015    Medications: reviewed and updated Current Outpatient Prescriptions  Medication Sig Dispense Refill  . losartan-hydrochlorothiazide (HYZAAR) 100-25 MG tablet Take 1 tablet by mouth daily. 90 tablet 2  . meclizine (ANTIVERT) 25 MG tablet Take 1 tablet (25 mg total) by mouth daily as needed. 30 tablet 3  . Ledipasvir-Sofosbuvir ([name]) 90-400 MG TABS Take 1 tablet by mouth daily. (Patient not taking: Reported on 08/11/2015) 28 tablet 2  . polyethylene glycol powder (GLYCOLAX/MIRALAX) powder Take 17 g by mouth daily. (Patient not taking: Reported on 11/08/2015) 3350 g 5  . sildenafil (VIAGRA) 100 MG tablet Take 0.5 tablets (50 mg total) by mouth daily as needed for  erectile dysfunction. 1 tablet 3   No current facility-administered medications for this visit.     Social Hx:  reports that he has quit smoking. He has never used smokeless tobacco.   Objective:   BP 126/84   Pulse 77   Temp 98 F (36.7 C) (Oral)   Ht '5\' 10"'$  (1.778 m)   Wt 182 lb 3.2 oz (82.6 kg)   SpO2 99%   BMI 26.14 kg/m  Physical Exam  Gen: NAD, alert, cooperative with exam,  well-appearing HEENT: NCAT, PERRL, clear conjunctiva, oropharynx clear, supple neck, Poor dentition  Cardiac: Regular rate and rhythm, normal S1/S2, no murmur, no edema, capillary refill brisk  Respiratory: Clear to auscultation bilaterally, no wheezes, non-labored breathing Gastrointestinal: Well-healed midline incisional scar, soft, non tender, non distended, bowel sounds present Skin: no rashes, normal turgor  Neurological: no gross deficits.  Psych: good insight, normal mood and affect    Assessment & Plan:  Essential hypertension Controlled and at goal. Goal BP less than 140/90. -Continue Hyzaar as prescribed -Return 3 months  Chronic hepatitis C without hepatic coma ([facility]) Chronic. Untreated. Patient still has not seen the infectious disease doctor. He notes that he will call the infectious disease office at a later time .There appears to be no barriers to him seeing  the infectious disease doctor he just does not want to follow up. Again emphasized importance of calling infectious disease doctor and scheduling an appointment as soon as possible.  Alcohol abuse Recovering alcoholic per patient report. He notes that he hasn't had any alcohol in the last 2 weeks. Offered encouragement and praise for patients accomplishment. No signs of alcohol withdrawal at this time. -We'll continue to monitor  Episodic recurrent vertigo Symptoms controlled with meclizine. Symptoms have also greatly improved after patient stopped drinking alcohol. -Refilled for meclizine provided  History of partial colectomy Patient had a right partial colectomy in June 2012 in [place]. He notes at that time his GI doctor told him that he needed a colonoscopy every 2 years. He notes that his last colonoscopy was in 2013. He is not having any abdominal pain at this time and seems to be stooling appropriately. -Referral for GI placed  Erectile dysfunction Unclear etiology. No signs of prostate issues based on  history. Patient deferred genital exam at this time. -Gave prescription for 1 Viagra pill as patient reports he cannot afford more than this, we'll continue to follow along   Smitty Cords, MD [place], PGY-2

## 2016-05-18 NOTE — Assessment & Plan Note (Signed)
Recovering alcoholic per patient report. He notes that he hasn't had any alcohol in the last 2 weeks. Offered encouragement and praise for patients accomplishment. No signs of alcohol withdrawal at this time. -We'll continue to monitor

## 2016-05-24 MED FILL — MECLIZINE 25 MG TABLET: 25 | 30 days supply | Qty: 30 | Fill #0

## 2016-05-24 MED FILL — LOSARTAN-HCTZ 100-25 MG TAB: 100-25 | 90 days supply | Qty: 90 | Fill #0

## 2016-05-25 DIAGNOSIS — Z86010 Personal history of colonic polyps: Secondary | ICD-10-CM | POA: Diagnosis not present

## 2016-05-29 MED FILL — GAVILYTE-N SOLUTION: 420 | 1 days supply | Qty: 4000 | Fill #0

## 2016-06-06 DIAGNOSIS — Z96652 Presence of left artificial knee joint: Secondary | ICD-10-CM | POA: Diagnosis not present

## 2016-06-06 DIAGNOSIS — S8002XD Contusion of left knee, subsequent encounter: Secondary | ICD-10-CM | POA: Diagnosis not present

## 2016-06-06 DIAGNOSIS — M25462 Effusion, left knee: Secondary | ICD-10-CM | POA: Diagnosis not present

## 2016-06-06 DIAGNOSIS — M5442 Lumbago with sciatica, left side: Secondary | ICD-10-CM | POA: Diagnosis not present

## 2016-06-15 DIAGNOSIS — Z86010 Personal history of colonic polyps: Secondary | ICD-10-CM | POA: Diagnosis not present

## 2016-06-15 DIAGNOSIS — Z980 Intestinal bypass and anastomosis status: Secondary | ICD-10-CM | POA: Diagnosis not present

## 2016-06-15 DIAGNOSIS — D126 Benign neoplasm of colon, unspecified: Secondary | ICD-10-CM | POA: Diagnosis not present

## 2016-06-15 DIAGNOSIS — K640 First degree hemorrhoids: Secondary | ICD-10-CM | POA: Diagnosis not present

## 2016-06-19 DIAGNOSIS — G8929 Other chronic pain: Secondary | ICD-10-CM | POA: Diagnosis not present

## 2016-06-19 DIAGNOSIS — M5442 Lumbago with sciatica, left side: Secondary | ICD-10-CM | POA: Diagnosis not present

## 2016-06-20 DIAGNOSIS — D126 Benign neoplasm of colon, unspecified: Secondary | ICD-10-CM | POA: Diagnosis not present

## 2016-06-23 DIAGNOSIS — M5442 Lumbago with sciatica, left side: Secondary | ICD-10-CM | POA: Diagnosis not present

## 2016-06-23 DIAGNOSIS — G8929 Other chronic pain: Secondary | ICD-10-CM | POA: Diagnosis not present

## 2016-07-12 DIAGNOSIS — M5442 Lumbago with sciatica, left side: Secondary | ICD-10-CM | POA: Diagnosis not present

## 2016-07-31 DIAGNOSIS — G8929 Other chronic pain: Secondary | ICD-10-CM | POA: Diagnosis not present

## 2016-07-31 DIAGNOSIS — M5442 Lumbago with sciatica, left side: Secondary | ICD-10-CM | POA: Diagnosis not present

## 2016-08-09 DIAGNOSIS — M5442 Lumbago with sciatica, left side: Secondary | ICD-10-CM | POA: Diagnosis not present

## 2016-08-09 DIAGNOSIS — G8929 Other chronic pain: Secondary | ICD-10-CM | POA: Diagnosis not present

## 2016-08-14 DIAGNOSIS — M5442 Lumbago with sciatica, left side: Secondary | ICD-10-CM | POA: Diagnosis not present

## 2016-08-14 DIAGNOSIS — G8929 Other chronic pain: Secondary | ICD-10-CM | POA: Diagnosis not present

## 2016-08-16 DIAGNOSIS — G8929 Other chronic pain: Secondary | ICD-10-CM | POA: Diagnosis not present

## 2016-08-16 DIAGNOSIS — M5442 Lumbago with sciatica, left side: Secondary | ICD-10-CM | POA: Diagnosis not present

## 2016-08-17 ENCOUNTER — Ambulatory Visit: Payer: PRIVATE HEALTH INSURANCE | Admitting: Family Medicine

## 2016-09-22 MED FILL — SILDENAFIL 100 MG TABLET: 100 | 2 days supply | Qty: 1 | Fill #1

## 2016-11-15 ENCOUNTER — Encounter: Payer: Self-pay | Admitting: Family Medicine

## 2016-11-15 ENCOUNTER — Ambulatory Visit (INDEPENDENT_AMBULATORY_CARE_PROVIDER_SITE_OTHER): Payer: Medicare Other | Admitting: Family Medicine

## 2016-11-15 VITALS — BP 126/70 | HR 76 | Temp 98.5°F | Ht 70.0 in | Wt 175.2 lb

## 2016-11-15 DIAGNOSIS — M25552 Pain in left hip: Secondary | ICD-10-CM

## 2016-11-15 MED ORDER — KETOROLAC TROMETHAMINE 30 MG/ML IJ SOLN
30.0000 mg | Freq: Once | INTRAMUSCULAR | Status: AC
Start: 2016-11-15 — End: 2016-11-15
  Administered 2016-11-15: 30 mg via INTRAMUSCULAR

## 2016-11-15 MED ORDER — CYCLOBENZAPRINE HCL 5 MG PO TABS: 5.0000 mg | ORAL_TABLET | Freq: Three times a day (TID) | ORAL | 1 refills | Status: DC | PRN

## 2016-11-15 MED ORDER — LOSARTAN POTASSIUM-HCTZ 100-25 MG PO TABS: 1.0000 | ORAL_TABLET | Freq: Every day | ORAL | 2 refills | Status: DC

## 2016-11-15 MED FILL — LOSARTAN-HCTZ 100-25 MG TAB: 100-25 | 90 days supply | Qty: 90 | Fill #0

## 2016-11-15 MED FILL — CYCLOBENZAPRINE 5 MG TABLET: 5 | 10 days supply | Qty: 30 | Fill #0

## 2016-11-15 NOTE — Patient Instructions (Addendum)
Thank you for coming in today, it was so nice to see you! Today we talked about:    Please call your GI office to have them fax your colonoscopy results: [phone]  For your left hip pain, please take the flexeril as prescribed. If your pain is not better after 1 week you will need to go back and see the orthopedic doctor   If we ordered any tests today, you will be notified via telephone of any abnormalities. If everything is normal you will get a letter in the mail.   If you have any questions or concerns, please do not hesitate to call the office at [phone]. You can also message me directly via MyChart.   Sincerely,  Smitty Cords, MD

## 2016-11-15 NOTE — Progress Notes (Signed)
   Subjective:    Patient ID: Jeffrey [name] , male   DOB: 12/27/1950 , 66 y.o..   MRN: [id number]  HPI  Baldo Hufnagle is here for   1. Left hip pain: Patient notes that he has had left hip pain for the last couple weeks. He actually went to his orthopedic doctor and states he received on "injection in my back" but stated it didn't help. Notes that the pain is worse with ambulation but also very present at rest. Notes that he has been trying Tylenol without relief.    Review of Systems: Per HPI.    Past Medical History: Patient Active Problem List   Diagnosis Date Noted  . Left hip pain 11/26/2016  . History of partial colectomy 05/18/2016  . Erectile dysfunction 05/18/2016  . Episodic recurrent vertigo 01/20/2016  . Chronic hepatitis C without hepatic coma ([place]) 06/23/2015  . Essential hypertension 04/12/2015  . Arthritis of knee 04/12/2015  . Alcohol abuse 04/12/2015  . Abdominal pain 04/12/2015    Medications: reviewed and updated Current Outpatient Prescriptions  Medication Sig Dispense Refill  . cyclobenzaprine (FLEXERIL) 5 MG tablet Take 1 tablet (5 mg total) by mouth 3 (three) times daily as needed for muscle spasms. 30 tablet 1  . Ledipasvir-Sofosbuvir ([name]) 90-400 MG TABS Take 1 tablet by mouth daily. (Patient not taking: Reported on 08/11/2015) 28 tablet 2  . losartan-hydrochlorothiazide (HYZAAR) 100-25 MG tablet Take 1 tablet by mouth daily. 90 tablet 2  . meclizine (ANTIVERT) 25 MG tablet Take 1 tablet (25 mg total) by mouth daily as needed. 30 tablet 3  . polyethylene glycol powder (GLYCOLAX/MIRALAX) powder Take 17 g by mouth daily. (Patient not taking: Reported on 11/08/2015) 3350 g 5  . sildenafil (VIAGRA) 100 MG tablet Take 0.5 tablets (50 mg total) by mouth daily as needed for erectile dysfunction. 1 tablet 3   No current facility-administered medications for this visit.     Social Hx:  reports that he has quit smoking. He has never used smokeless  tobacco.   Objective:   BP 126/70   Pulse 76   Temp 98.5 F (36.9 C) (Oral)   Ht 5\' 10"  (1.778 m)   Wt 175 lb 3.2 oz (79.5 kg)   SpO2 98%   BMI 25.14 kg/m  Physical Exam  Gen: NAD, alert, cooperative with exam, well-appearing Left Hip: ROM: Full in all directions Strength IR: 5/5, ER: 5/5, Flexion: 5/5, Extension: 5/5, Abduction: 5/5, Adduction: 5/5 Pelvic alignment unremarkable to inspection and palpation. Standing hip rotation and gait without trendelenburg sign / unsteadiness. Greater trochanter with tenderness to palpation. No tenderness over piriformis. No pain with FABER or FADIR. No SI joint tenderness and normal minimal SI movement.  Assessment & Plan:  Left hip pain Pain appears to be greater trochanter bursitis based on point tenderness over the area. Could also be radiation from low back or arthritis. Neurovascularly intact. Patient has been following with his orthopedist for his pain but does not know what he was diagnosed with. Advised for him to follow up with them as I do not want two physicians treating him for the same issue. Provided Toradol shot today and 1 week course of flexeril.    Smitty Cords, MD [place], PGY-3

## 2016-11-26 DIAGNOSIS — M25552 Pain in left hip: Secondary | ICD-10-CM | POA: Insufficient documentation

## 2016-11-26 NOTE — Assessment & Plan Note (Addendum)
Pain appears to be greater trochanter bursitis based on point tenderness over the area. Could also be radiation from low back or arthritis. Neurovascularly intact. Patient has been following with his orthopedist for his pain but does not know what he was diagnosed with. Advised for him to follow up with them as I do not want two physicians treating him for the same issue. Provided Toradol shot today and 1 week course of flexeril.

## 2016-12-01 ENCOUNTER — Telehealth: Payer: Self-pay | Admitting: Family Medicine

## 2016-12-01 NOTE — Telephone Encounter (Signed)
Contacted pt and he stated that he would get his results from [place] and have them sent to Dr. Juanito Doom.  He stated that he appreciated Dr. Juanito Doom and her time and also wanted to let her know that he has done everything that she requested him to do.  Routing to PCP as an Pharmacist, hospital. Katharina Caper, Catha Ontko D, [place]

## 2016-12-01 NOTE — Telephone Encounter (Signed)
I do not have access to patient's colonoscopy results as it was done at an outside facility. [facility] team please call patient and inform him that he will need to contact his gastroenterology office who performed the procedure for the results. Thank you.   Smitty Cords, MD [place], PGY-3

## 2016-12-01 NOTE — Telephone Encounter (Signed)
Patient had colonoscopy done few weeks ago and would like to discuss results with Dr. Juanito Doom. He is requesting that she calls him to discuss. Please advise.

## 2016-12-13 ENCOUNTER — Telehealth: Payer: Self-pay | Admitting: Family Medicine

## 2016-12-13 DIAGNOSIS — D369 Benign neoplasm, unspecified site: Secondary | ICD-10-CM

## 2016-12-13 NOTE — Telephone Encounter (Signed)
Patient came to office with letter for Dr Juanito Doom. Placed in white team folder.

## 2016-12-18 NOTE — Telephone Encounter (Signed)
Letter put in Dr. Thad Ranger box. Ottis Stain, CMA

## 2016-12-19 IMAGING — DX DG KNEE COMPLETE 4+V*L*
4 series · 4 of 4 positions shown · non-contrast
Comparison: None

CLINICAL DATA: Pain throughout LEFT knee with swelling for couple
weeks, no injury, LEFT knee replacement 07/06/2014, arthritis

EXAM:
LEFT KNEE - COMPLETE 4+ VIEW

[knee ap]
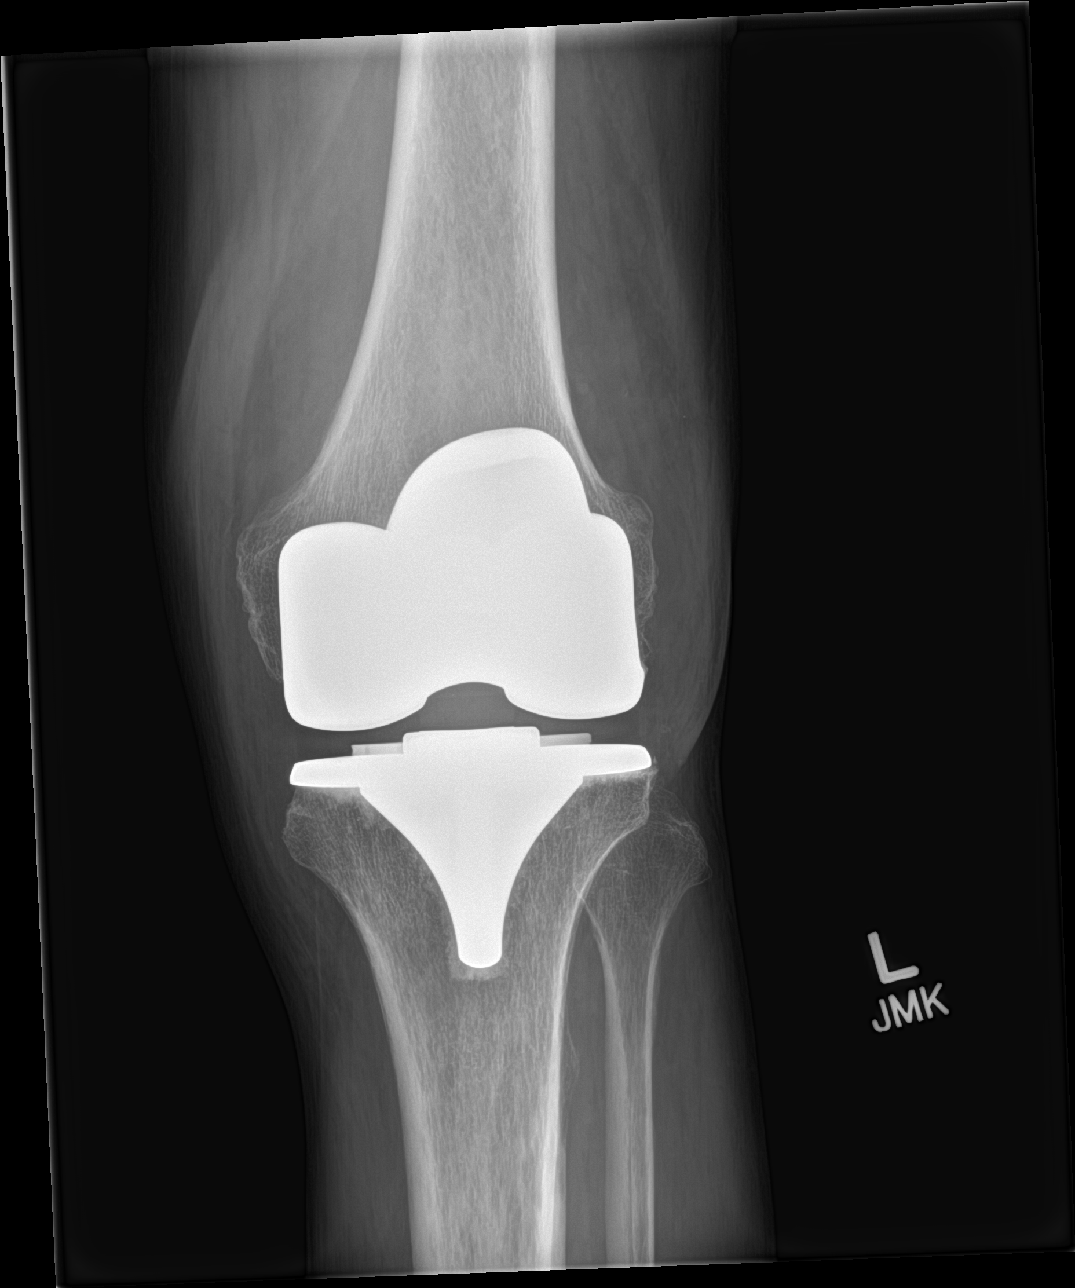

[tunnel]
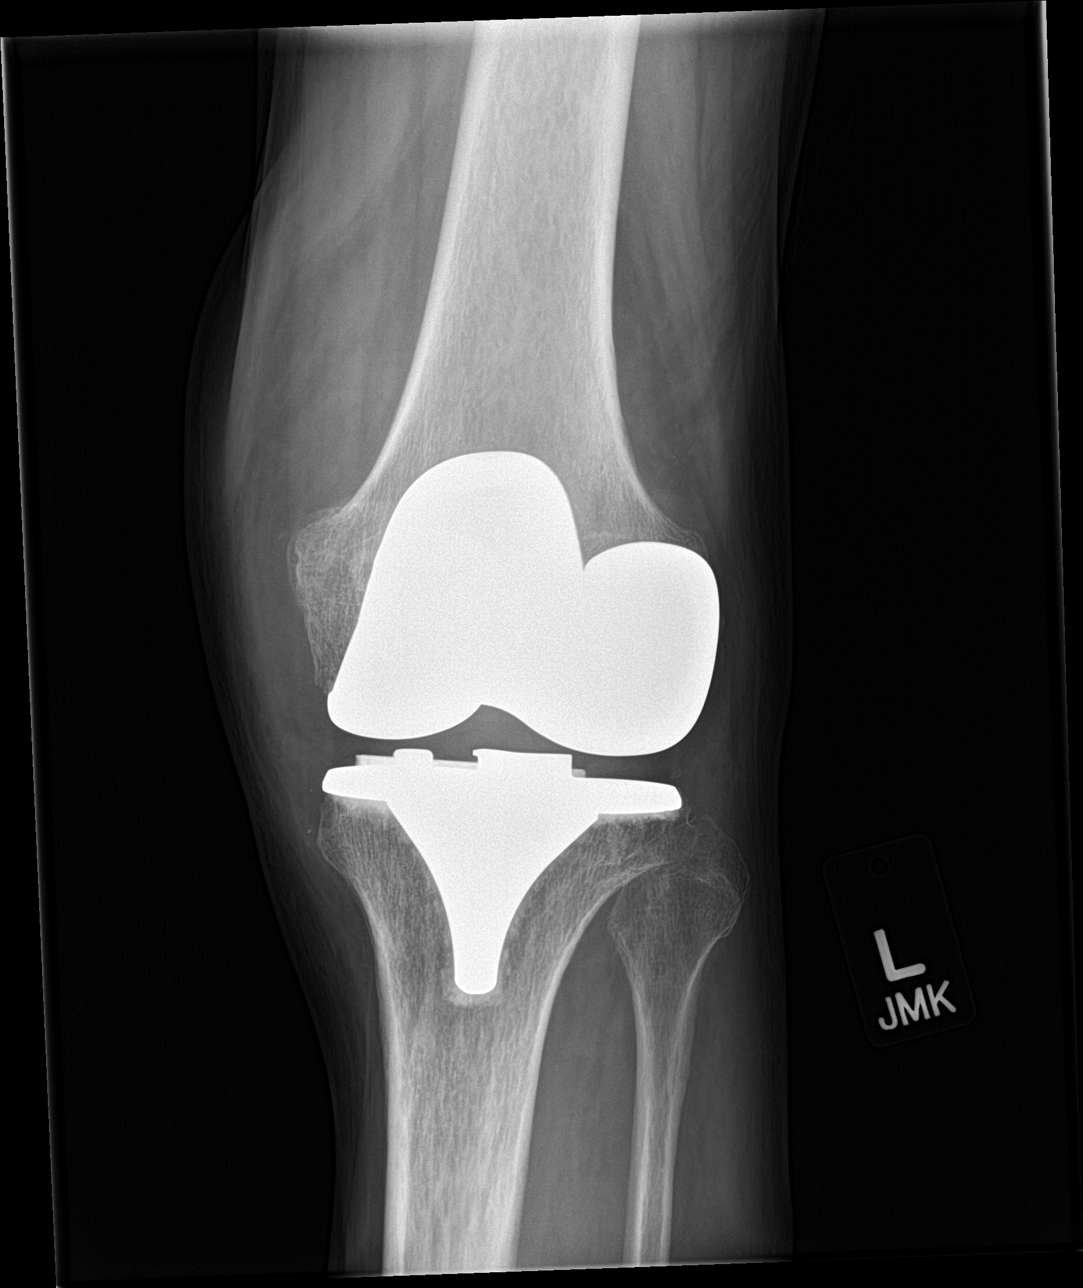

[knee lat]
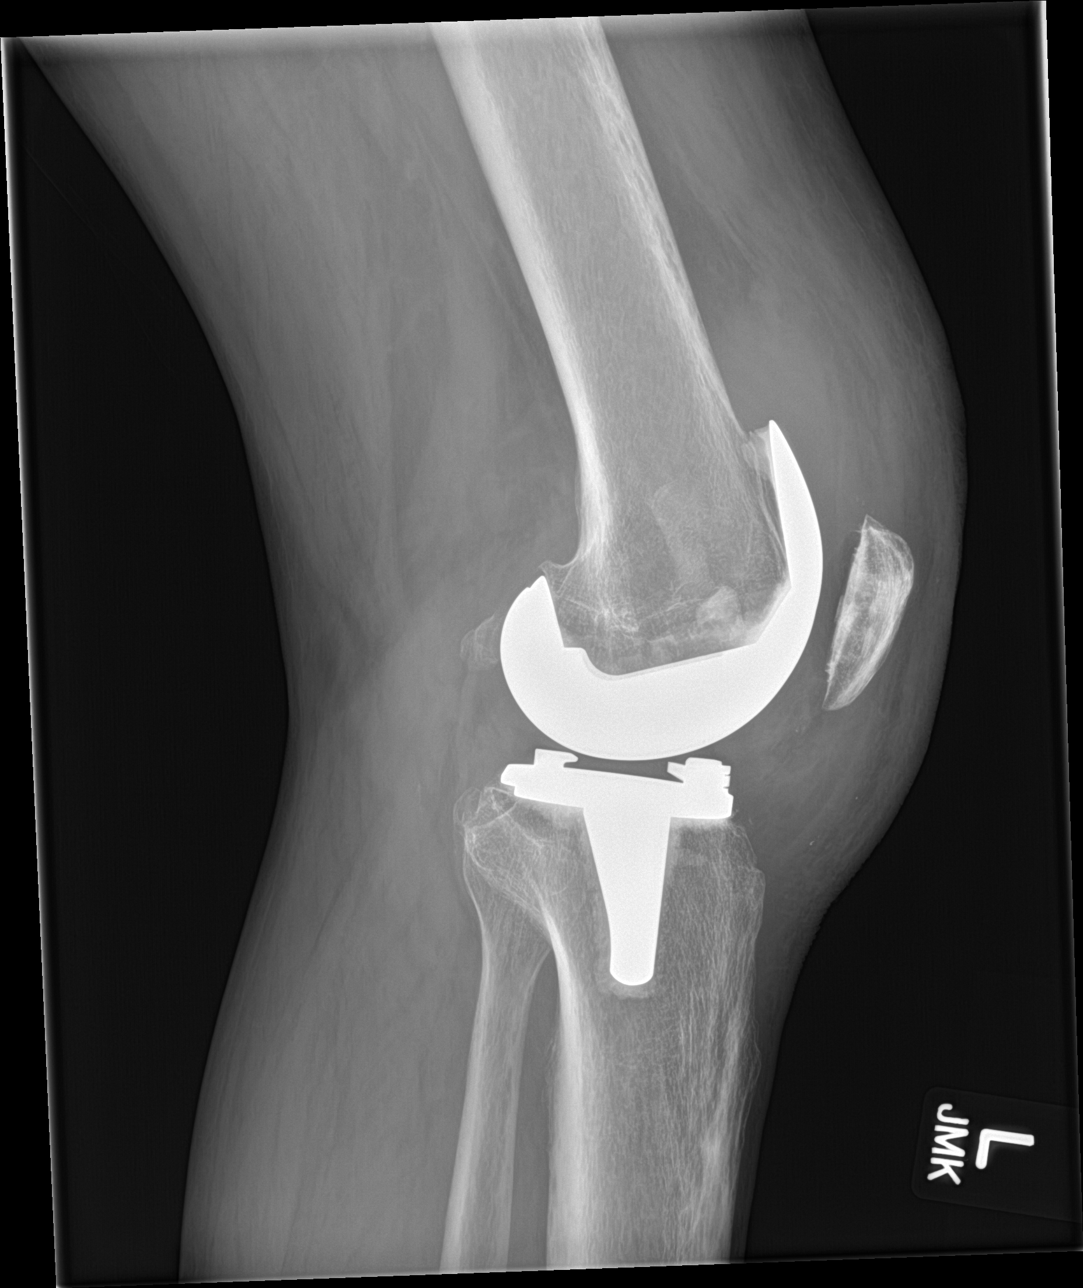

[knee obl]
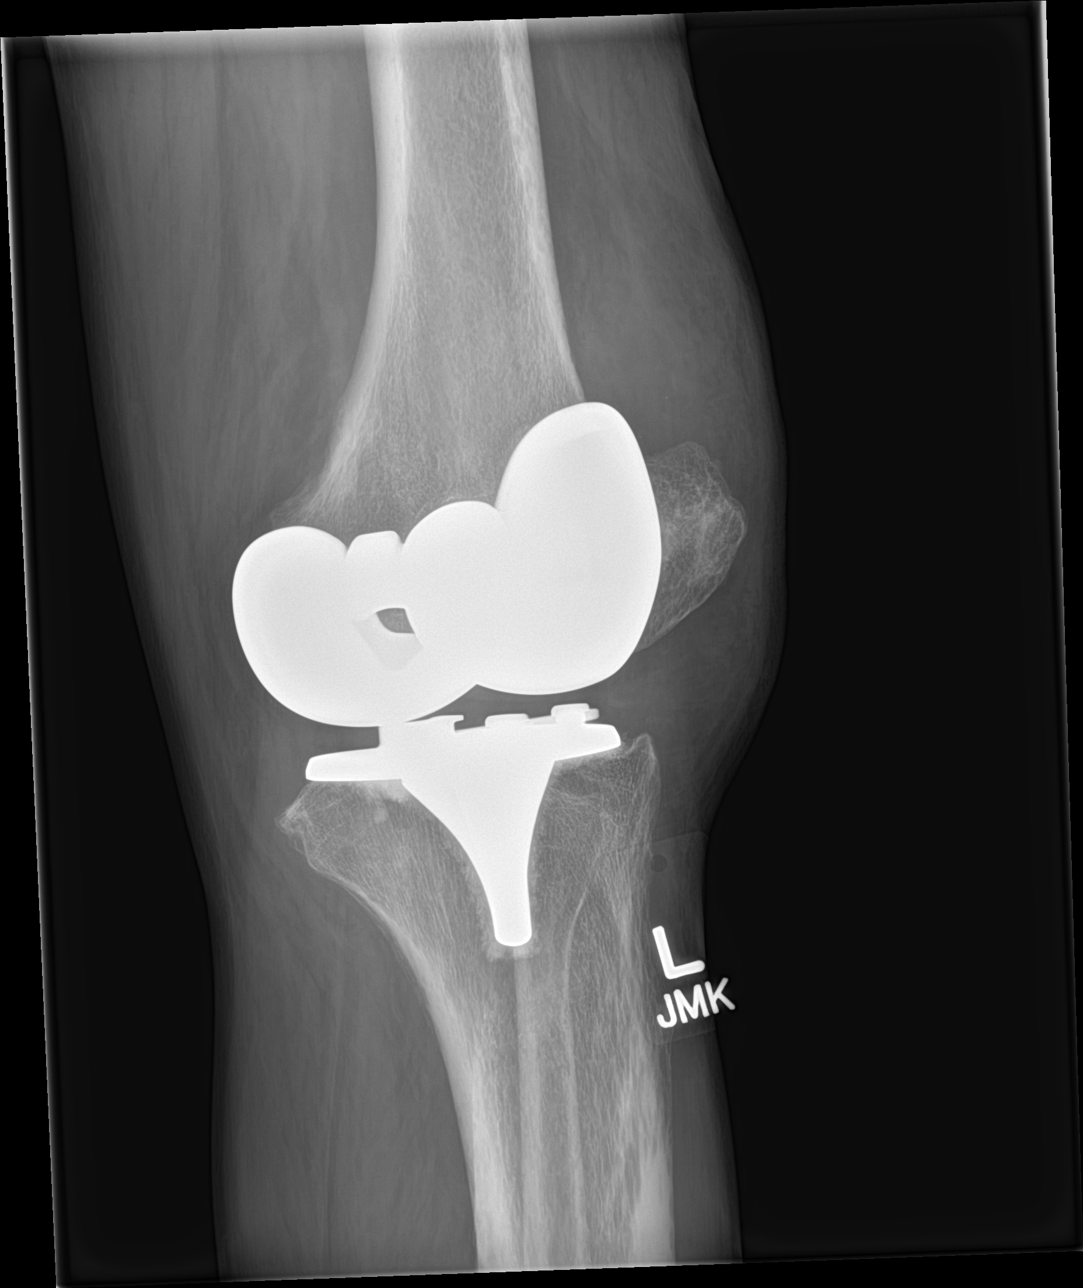

[4 of 4 positions shown; findings below may reference images not displayed]

FINDINGS: Osseous demineralization.

Components of LEFT knee prosthesis identified.

No periprosthetic lucency.

Diffuse soft tissue swelling about LEFT knee greatest anteriorly.

Large LEFT knee joint effusion.

No acute fracture, dislocation, or bone destruction.
IMPRESSION: Osseous demineralization with postsurgical changes of LEFT knee
arthroplasty.

Large knee joint effusion and significant regional soft tissue
swelling greatest anteriorly.

## 2016-12-26 DIAGNOSIS — D369 Benign neoplasm, unspecified site: Secondary | ICD-10-CM | POA: Insufficient documentation

## 2016-12-26 NOTE — Telephone Encounter (Signed)
Placed letter in [place] box. Thanks.

## 2016-12-28 NOTE — Telephone Encounter (Signed)
Patient aware that form is ready for pick up. Sharesa Kemp,CMA

## 2017-01-02 NOTE — Progress Notes (Signed)
Subjective:    Patient ID: Jeffrey [name] , male   DOB: 1950-07-01 , 66 y.o..   MRN: [id number]  HPI  Anshul Meddings is here for  Chief Complaint  Patient presents with  . Hypertension   1. .Hypertension Blood pressure at home: Does not check  Exercise: minimal  Low salt diet: compliant Medications: Compliant with ARB Side effects: none ROS: Denies headache, dizziness, visual changes, nausea, vomiting, chest pain, abdominal pain or shortness of breath. BP Readings from Last 3 Encounters:  01/03/17 128/76  11/15/16 126/70  05/18/16 126/84   2. Cotton stuck in ear: Patient notes that he was cleaning his ear a couple days ago with a Q-tip and the cotton part of the Q-tip got stuck in his left ear. He notes that it has been very irritating to him because he can't get it out. Denies any fever or ear drainage.  3. Erectile dysfunction: Patient has been experiencing erectile dysfunction for about a year. He notes that he also has some difficulty ejaculating at times. Admits that he does wake up in the morning at times with an erection. He has been sexually active with his girlfriend but notes that it is becoming more and more difficult to obtain erection. Denies any difficulty urinating or dribbling.  4. Decreased visual acuity: Patient wears glasses and notes that he thinks his vision is declining  Review of Systems: Per HPI. All other systems reviewed and are negative.  Past Medical History: Patient Active Problem List   Diagnosis Date Noted  . Tubular adenoma 12/26/2016  . Left hip pain 11/26/2016  . History of partial colectomy 05/18/2016  . Erectile dysfunction 05/18/2016  . Episodic recurrent vertigo 01/20/2016  . Chronic hepatitis C without hepatic coma ([place]) 06/23/2015  . Essential hypertension 04/12/2015  . Arthritis of knee 04/12/2015  . Alcohol abuse 04/12/2015  . Abdominal pain 04/12/2015    Medications: reviewed and updated Current Outpatient Prescriptions    Medication Sig Dispense Refill  . cyclobenzaprine (FLEXERIL) 5 MG tablet Take 1 tablet (5 mg total) by mouth 3 (three) times daily as needed for muscle spasms. 30 tablet 1  . Ledipasvir-Sofosbuvir ([name]) 90-400 MG TABS Take 1 tablet by mouth daily. (Patient not taking: Reported on 08/11/2015) 28 tablet 2  . losartan-hydrochlorothiazide (HYZAAR) 100-25 MG tablet Take 1 tablet by mouth daily. 90 tablet 2  . meclizine (ANTIVERT) 25 MG tablet Take 1 tablet (25 mg total) by mouth daily as needed. 30 tablet 3  . polyethylene glycol powder (GLYCOLAX/MIRALAX) powder Take 17 g by mouth daily. (Patient not taking: Reported on 11/08/2015) 3350 g 5  . sildenafil (VIAGRA) 100 MG tablet Take 0.5 tablets (50 mg total) by mouth daily as needed for erectile dysfunction. 1 tablet 3  . Tdap (BOOSTRIX) 5-2.5-18.5 LF-MCG/0.5 injection Inject 0.5 mLs into the muscle once. 0.5 mL 0   No current facility-administered medications for this visit.     Social Hx:  reports that he has quit smoking. He has never used smokeless tobacco.   Objective:   BP 128/76   Pulse 88   Temp 98.9 F (37.2 C) (Oral)   Ht 5\' 10"  (1.778 m)   Wt 175 lb (79.4 kg)   SpO2 98%   BMI 25.11 kg/m  Physical Exam  Gen: NAD, alert, cooperative with exam, well-appearing HEENT:     Head: Normocephalic, atraumatic    Neck: No masses palpated. No goiter. No lymphadenopathy     Ears: External ears normal, Right tympanic membrane  normal, cotton stuck in her left ear canal, unable to observe tympanic membrane    Eyes: PERRLA, EOMI, sclera white, normal conjunctiva    Nose: nasal turbinates moist, no nasal discharge    Throat: moist mucus membranes, no pharyngeal erythema, no tonsillar exudate. Airway is patent Cardiac: Regular rate and rhythm, normal S1-S2, no murmurs, no edema Respiratory: Normal work of breathing, clear to auscultation bilaterally   Assessment & Plan:  Essential hypertension Controlled and at goal -CMP and CBC  collected today -Continue Hyzaar -Follow-up in 6 months   Erectile dysfunction Again defer general exam. Patient wants to see a urologist at this time. Has been taking Viagra intermittently but notes that he can't afford it. -Referral to urology placed per patient request  -continue Viagra as needed  Foreign object in the left ear: Spivey stuck in left ear. Removed today during exam with forceps. After object was removed tympanic membrane was visualized and normal  Decreased visual acuity: Patient wears glasses and notes that he thinks his vision is declining. Likely age-related. -Referral to ophthalmology placed  Meds ordered this encounter  Medications  . Tdap (BOOSTRIX) 5-2.5-18.5 LF-MCG/0.5 injection    Sig: Inject 0.5 mLs into the muscle once.    Dispense:  0.5 mL    Refill:  0    Smitty Cords, MD [place], PGY-3

## 2017-01-03 ENCOUNTER — Encounter: Payer: Self-pay | Admitting: Family Medicine

## 2017-01-03 ENCOUNTER — Ambulatory Visit (INDEPENDENT_AMBULATORY_CARE_PROVIDER_SITE_OTHER): Payer: Medicare Other | Admitting: Family Medicine

## 2017-01-03 VITALS — BP 128/76 | HR 88 | Temp 98.9°F | Ht 70.0 in | Wt 175.0 lb

## 2017-01-03 DIAGNOSIS — I10 Essential (primary) hypertension: Secondary | ICD-10-CM

## 2017-01-03 DIAGNOSIS — N529 Male erectile dysfunction, unspecified: Secondary | ICD-10-CM

## 2017-01-03 DIAGNOSIS — T162XXA Foreign body in left ear, initial encounter: Secondary | ICD-10-CM | POA: Diagnosis not present

## 2017-01-03 DIAGNOSIS — H547 Unspecified visual loss: Secondary | ICD-10-CM

## 2017-01-03 MED ORDER — TETANUS-DIPHTH-ACELL PERTUSSIS 5-2.5-18.5 LF-MCG/0.5 IM SUSP: 0.5000 mL | Freq: Once | INTRAMUSCULAR | 0 refills | Status: AC

## 2017-01-03 NOTE — Assessment & Plan Note (Signed)
Controlled and at goal -CMP and CBC collected today -Continue Hyzaar -Follow-up in 6 months

## 2017-01-03 NOTE — Patient Instructions (Signed)
Thank you for coming in today, it was so nice to see you! Today we talked about:    Blood pressure: Your blood pressure looks great! Keep doing what you're doing  I have placed a referral to the urologist for you, someone will call you to schedule this appointment  I have placed a referral for you to see an eye specialist, someone will call you to schedule this as well   Please follow up in 6 months or sooner if needed.    If we ordered any tests today, you will be notified via telephone of any abnormalities. If everything is normal you will get a letter in the mail.   If you have any questions or concerns, please do not hesitate to call the office at [phone]. You can also message me directly via MyChart.   Sincerely,  Smitty Cords, MD

## 2017-01-03 NOTE — Assessment & Plan Note (Signed)
Again defer general exam. Patient wants to see a urologist at this time. Has been taking Viagra intermittently but notes that he can't afford it. -Referral to urology placed per patient request  -continue Viagra as needed

## 2017-01-04 LAB — COMPREHENSIVE METABOLIC PANEL
ALBUMIN: 4.5 g/dL (ref 3.6–4.8)
ALK PHOS: 101 IU/L (ref 39–117)
ALT: 25 IU/L (ref 0–44)
AST: 46 IU/L — AB (ref 0–40)
Albumin/Globulin Ratio: 1.4 (ref 1.2–2.2)
BUN / CREAT RATIO: 10 (ref 10–24)
BUN: 10 mg/dL (ref 8–27)
Bilirubin Total: 0.8 mg/dL (ref 0.0–1.2)
CALCIUM: 9.7 mg/dL (ref 8.6–10.2)
CO2: 23 mmol/L (ref 20–29)
CREATININE: 1.03 mg/dL (ref 0.76–1.27)
Chloride: 97 mmol/L (ref 96–106)
GFR calc Af Amer: 88 mL/min/{1.73_m2} (ref >59)
GFR calc non Af Amer: 76 mL/min/{1.73_m2} (ref >59)
GLUCOSE: 90 mg/dL (ref 65–99)
Globulin, Total: 3.2 g/dL (ref 1.5–4.5)
Potassium: 3.7 mmol/L (ref 3.5–5.2)
Sodium: 136 mmol/L (ref 134–144)
Total Protein: 7.7 g/dL (ref 6.0–8.5)

## 2017-01-04 LAB — CBC WITH DIFFERENTIAL/PLATELET
BASOS ABS: 0 10*3/uL (ref 0.0–0.2)
Basos: 0 %
EOS (ABSOLUTE): 0.1 10*3/uL (ref 0.0–0.4)
Eos: 1 %
HEMOGLOBIN: 12.9 g/dL — AB (ref 13.0–17.7)
Hematocrit: 37.3 % — ABNORMAL LOW (ref 37.5–51.0)
IMMATURE GRANULOCYTES: 0 %
Immature Grans (Abs): 0 10*3/uL (ref 0.0–0.1)
LYMPHS ABS: 2 10*3/uL (ref 0.7–3.1)
Lymphs: 21 %
MCH: 35 pg — ABNORMAL HIGH (ref 26.6–33.0)
MCHC: 34.6 g/dL (ref 31.5–35.7)
MCV: 101 fL — ABNORMAL HIGH (ref 79–97)
MONOCYTES: 10 %
Monocytes Absolute: 0.9 10*3/uL (ref 0.1–0.9)
NEUTROS PCT: 68 %
Neutrophils Absolute: 6.7 10*3/uL (ref 1.4–7.0)
Platelets: 269 10*3/uL (ref 150–379)
RBC: 3.69 x10E6/uL — AB (ref 4.14–5.80)
RDW: 13 % (ref 12.3–15.4)
WBC: 9.7 10*3/uL (ref 3.4–10.8)

## 2017-01-09 ENCOUNTER — Encounter: Payer: Self-pay | Admitting: Family Medicine

## 2017-02-12 DIAGNOSIS — N5201 Erectile dysfunction due to arterial insufficiency: Secondary | ICD-10-CM | POA: Diagnosis not present

## 2017-04-12 ENCOUNTER — Ambulatory Visit: Payer: Medicare Other | Admitting: Family Medicine

## 2017-05-24 ENCOUNTER — Encounter: Payer: Self-pay | Admitting: Family Medicine

## 2017-05-28 MED FILL — LOSARTAN-HCTZ 100-25 MG TAB: 100-25 | 90 days supply | Qty: 90 | Fill #1

## 2017-05-31 NOTE — Progress Notes (Signed)
Subjective:  Jeffrey [name] is a 67 y.o. year old male who presents to office today for an annual physical examination.  Concerns today include:  1. None  Review of Systems  Constitutional: Negative for fever and weight loss.  HENT: Negative for ear pain, hearing loss and sinus pain.   Eyes: Negative for blurred vision.  Respiratory: Negative for cough, shortness of breath and wheezing.   Cardiovascular: Negative for chest pain and leg swelling.  Gastrointestinal: Negative for abdominal pain, blood in stool, constipation, diarrhea, heartburn, melena, nausea and vomiting.  Genitourinary: Negative for dysuria and frequency.  Musculoskeletal: Negative for back pain and joint pain.  Skin: Negative for rash.  Neurological: Negative for dizziness, tingling, focal weakness and headaches.  Psychiatric/Behavioral: Negative for depression and suicidal ideas.    General Healthcare: Medication Compliance: yes Dx Hypertension: yes Dx Hyperlipidemia: no Diabetes: no Dx Obesity: no Weight Loss: no Physical Activity: walks a lot  Urinary Incontinence: no  Menstrual hx: N/A Last dental exam: has been years, is trying to get in to see someone  Social:  reports that he has quit smoking. he has never used smokeless tobacco. 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 Samul Dada  07/06/1971    Past Medical History Past Medical History:  Diagnosis Date  . Arthritis   . Chronic hepatitis C ([place])   . Hepatic cysts 12/01/2013   Seen on CT  . Hypertension   . Renal cysts 12/01/2013   Seen on CT   Patient Active Problem List   Diagnosis Date Noted  . Tubular  adenoma 12/26/2016  . Left hip pain 11/26/2016  . History of partial colectomy 05/18/2016  . Erectile dysfunction 05/18/2016  . Episodic recurrent vertigo 01/20/2016  . Chronic hepatitis C without hepatic coma ([place]) 06/23/2015  . Essential hypertension 04/12/2015  . Arthritis of knee 04/12/2015  . Alcohol abuse 04/12/2015    Medications- reviewed and updated Current Outpatient Medications  Medication Sig Dispense Refill  . losartan-hydrochlorothiazide (HYZAAR) 100-25 MG tablet Take 1 tablet by mouth daily. 90 tablet 2  . cyclobenzaprine (FLEXERIL) 5 MG tablet Take 1 tablet (5 mg total) by mouth 3 (three) times daily as needed for muscle spasms. (Patient not taking: Reported on 06/01/2017) 30 tablet 1  . Ledipasvir-Sofosbuvir ([name]) 90-400 MG TABS Take 1 tablet by mouth daily. (Patient not taking: Reported on 08/11/2015) 28 tablet 2  . meclizine (ANTIVERT) 25 MG tablet Take 1 tablet (25 mg total) by mouth daily as needed. (Patient not taking: Reported on 06/01/2017) 30 tablet 3  . polyethylene glycol powder (GLYCOLAX/MIRALAX) powder Take 17 g by mouth daily. (Patient not taking: Reported on 11/08/2015) 3350 g 5   No current facility-administered medications for this visit.     Objective: BP 132/78   Pulse 89   Temp 98.7 F (37.1 C) (Oral)   Ht 5\' 10"  (1.778 m)   Wt 178 lb 6.4 oz (80.9 kg)   SpO2 97%   BMI 25.60 kg/m  Gen: In no acute distress, alert, cooperative with exam, well groomed HEENT: NCAT, EOMI, PERRL, poor dentition CV: Regular rate and rhythm, normal S1/S2, no murmur Resp: Clear to auscultation  bilaterally, no wheezes, non-labored Abd: Soft, Non Tender, Non Distended, bowel sounds present, no guarding or organomegaly, well healed midline incisional scar Ext: No edema, warm and well perfused Neuro: Alert and oriented, No gross deficits, normal gait Psych: Normal mood and affect  Assessment/Plan:  Essential hypertension Controlled and at goal -CMP and CBC collected  today -Continue current medication regimen -Follow-up in 6 months  Chronic hepatitis C without hepatic coma (HCC) Chronic and untreated.  Was referred to infectious disease last year he went once and was prescribed [name].  Patient never took this medication because he stated that it was too expensive.  Discussed that it is important for him to go back to the infectious disease specialist and discuss treatment options.  He is agreeable to this -Referral placed back to infectious disease -CMP today  Alcohol abuse Continues to drink alcohol, breath smells faintly of alcohol on exam today.  His son drove him to exam today.  He has stated that he has quit previously in the past and he plans to quit by slowly decreasing his alcohol again. -Discussed importance of gradual alcohol cessation, patient expressed good understanding  Annual physical exam: Overall patient doing well, interest alcohol abuse and hepatitis C as well as hypertension on exam today.  All health maintenance up-to-date with exception of Tdap vaccine - Prescription for Tdap vaccine given to take to pharmacy   Orders Placed This Encounter  Procedures  . Comprehensive metabolic panel    Order Specific Question:   Has the patient fasted?    Answer:   No  . CBC  . Ambulatory referral to Infectious Disease    Referral Priority:   Routine    Referral Type:   Consultation    Referral Reason:   Specialty Services Required    Requested Specialty:   Infectious Diseases    Number of Visits Requested:   1    Meds ordered this encounter  Medications  . Tdap (BOOSTRIX) 5-2.5-18.5 LF-MCG/0.5 injection    Sig: Inject 0.5 mLs into the muscle once for 1 dose.    Dispense:  0.5 mL    Refill:  0     Smitty Cords, MD [place], PGY-3

## 2017-06-01 ENCOUNTER — Ambulatory Visit (INDEPENDENT_AMBULATORY_CARE_PROVIDER_SITE_OTHER): Payer: Medicare Other | Admitting: Family Medicine

## 2017-06-01 ENCOUNTER — Encounter: Payer: Self-pay | Admitting: Family Medicine

## 2017-06-01 ENCOUNTER — Other Ambulatory Visit: Payer: Self-pay

## 2017-06-01 VITALS — BP 132/78 | HR 89 | Temp 98.7°F | Ht 70.0 in | Wt 178.4 lb

## 2017-06-01 DIAGNOSIS — Z0001 Encounter for general adult medical examination with abnormal findings: Secondary | ICD-10-CM

## 2017-06-01 DIAGNOSIS — I10 Essential (primary) hypertension: Secondary | ICD-10-CM

## 2017-06-01 DIAGNOSIS — Z0000 Encounter for general adult medical examination without abnormal findings: Secondary | ICD-10-CM

## 2017-06-01 DIAGNOSIS — F1010 Alcohol abuse, uncomplicated: Secondary | ICD-10-CM

## 2017-06-01 DIAGNOSIS — B182 Chronic viral hepatitis C: Secondary | ICD-10-CM | POA: Diagnosis not present

## 2017-06-01 MED ORDER — TETANUS-DIPHTH-ACELL PERTUSSIS 5-2.5-18.5 LF-MCG/0.5 IM SUSP: 0.5000 mL | Freq: Once | INTRAMUSCULAR | 0 refills | Status: AC

## 2017-06-01 NOTE — Patient Instructions (Addendum)
Thank you for coming in today, it was so nice to see you! Today we talked about:    You had your physical today.  Everything looks good.  Continue to try to quit smoking and drinking alcohol as this will be beneficial for your health  Schedule an appointment with a dentist for an exam  I have placed on a referral for a specialist to talk to you about hepatitis C, someone should call you within the next week to schedule this.  If you do not hear from [place] within the next week please call the clinic  we are also checking blood work today  Go to the pharmacy to have your tetanus booster vaccine, take a prescription with the  Please follow up in 1 year for your next physical.   Bring in all your medications or supplements to each appointment for review.   If we ordered any tests today, you will be notified via telephone of any abnormalities. If everything is normal you will get a letter in the mail.   If you have any questions or concerns, please do not hesitate to call the office at [phone]. You can also message me directly via MyChart.   Sincerely,  Smitty Cords, MD

## 2017-06-02 LAB — COMPREHENSIVE METABOLIC PANEL
A/G RATIO: 1.3 (ref 1.2–2.2)
ALK PHOS: 83 IU/L (ref 39–117)
ALT: 23 IU/L (ref 0–44)
AST: 41 IU/L — ABNORMAL HIGH (ref 0–40)
Albumin: 4.5 g/dL (ref 3.6–4.8)
BILIRUBIN TOTAL: 1 mg/dL (ref 0.0–1.2)
BUN/Creatinine Ratio: 9 — ABNORMAL LOW (ref 10–24)
BUN: 9 mg/dL (ref 8–27)
CHLORIDE: 97 mmol/L (ref 96–106)
CO2: 20 mmol/L (ref 20–29)
Calcium: 9.9 mg/dL (ref 8.6–10.2)
Creatinine, Ser: 1 mg/dL (ref 0.76–1.27)
GFR calc Af Amer: 92 mL/min/{1.73_m2} (ref >59)
GFR calc non Af Amer: 79 mL/min/{1.73_m2} (ref >59)
GLUCOSE: 86 mg/dL (ref 65–99)
Globulin, Total: 3.4 g/dL (ref 1.5–4.5)
POTASSIUM: 4.1 mmol/L (ref 3.5–5.2)
Sodium: 135 mmol/L (ref 134–144)
Total Protein: 7.9 g/dL (ref 6.0–8.5)

## 2017-06-02 LAB — CBC
HEMATOCRIT: 38.5 % (ref 37.5–51.0)
HEMOGLOBIN: 13.7 g/dL (ref 13.0–17.7)
MCH: 35.6 pg — ABNORMAL HIGH (ref 26.6–33.0)
MCHC: 35.6 g/dL (ref 31.5–35.7)
MCV: 100 fL — ABNORMAL HIGH (ref 79–97)
Platelets: 251 10*3/uL (ref 150–379)
RBC: 3.85 x10E6/uL — ABNORMAL LOW (ref 4.14–5.80)
RDW: 12.6 % (ref 12.3–15.4)
WBC: 6.9 10*3/uL (ref 3.4–10.8)

## 2017-06-03 NOTE — Assessment & Plan Note (Signed)
Controlled and at goal -CMP and CBC collected today -Continue current medication regimen -Follow-up in 6 months

## 2017-06-03 NOTE — Assessment & Plan Note (Signed)
Chronic and untreated.  Was referred to infectious disease last year he went once and was prescribed [name].  Patient never took this medication because he stated that it was too expensive.  Discussed that it is important for him to go back to the infectious disease specialist and discuss treatment options.  He is agreeable to this -Referral placed back to infectious disease -CMP today

## 2017-06-03 NOTE — Assessment & Plan Note (Signed)
Continues to drink alcohol, breath smells faintly of alcohol on exam today.  His son drove him to exam today.  He has stated that he has quit previously in the past and he plans to quit by slowly decreasing his alcohol again. -Discussed importance of gradual alcohol cessation, patient expressed good understanding

## 2017-07-10 ENCOUNTER — Encounter: Payer: Medicare Other | Admitting: Internal Medicine

## 2017-07-17 ENCOUNTER — Encounter: Payer: Medicare Other | Admitting: Infectious Disease

## 2017-09-26 MED FILL — LOSARTAN-HCTZ 100-25 MG TAB: 100-25 | 90 days supply | Qty: 90 | Fill #2

## 2017-12-20 ENCOUNTER — Other Ambulatory Visit: Payer: Self-pay

## 2017-12-20 ENCOUNTER — Encounter: Payer: Self-pay | Admitting: Family Medicine

## 2017-12-20 ENCOUNTER — Ambulatory Visit (INDEPENDENT_AMBULATORY_CARE_PROVIDER_SITE_OTHER): Payer: Medicare Other | Admitting: Family Medicine

## 2017-12-20 ENCOUNTER — Ambulatory Visit: Payer: Medicare Other | Admitting: Family Medicine

## 2017-12-20 VITALS — BP 124/70 | HR 79 | Temp 99.0°F | Ht 70.0 in | Wt 173.4 lb

## 2017-12-20 DIAGNOSIS — H524 Presbyopia: Secondary | ICD-10-CM

## 2017-12-20 DIAGNOSIS — F1010 Alcohol abuse, uncomplicated: Secondary | ICD-10-CM

## 2017-12-20 DIAGNOSIS — Z23 Encounter for immunization: Secondary | ICD-10-CM | POA: Diagnosis not present

## 2017-12-20 DIAGNOSIS — B182 Chronic viral hepatitis C: Secondary | ICD-10-CM

## 2017-12-20 DIAGNOSIS — I10 Essential (primary) hypertension: Secondary | ICD-10-CM | POA: Diagnosis not present

## 2017-12-20 MED ORDER — TETANUS-DIPHTH-ACELL PERTUSSIS 5-2.5-18.5 LF-MCG/0.5 IM SUSP: 0.5000 mL | Freq: Once | INTRAMUSCULAR | 0 refills | Status: AC

## 2017-12-20 MED ORDER — TETANUS-DIPHTH-ACELL PERTUSSIS 5-2.5-18.5 LF-MCG/0.5 IM SUSP: 0.5000 mL | Freq: Once | INTRAMUSCULAR | 0 refills | Status: DC

## 2017-12-20 NOTE — Patient Instructions (Signed)
It was a pleasure to see you today! Thank you for choosing [facility] for your primary care. Jeffrey [name] was seen for HTN, hepatitis.   Our plans for today were:  Go to [facility] as listed for your tetanus shot.   The ID doctor will call you with the appt.    Best,  Dr. Lindell Noe

## 2017-12-20 NOTE — Progress Notes (Signed)
   CC: meet PCP (PCP not available), HTN  HPI  Needs eye referral. Originally from [place]. Moved here 3 years ago. Hx of just vision trouble, no recollection of glaucoma or cataracts.   [facility] nurse came to the house and recommended physical and HM and labs.  Doesn't want flu shot and he "has never taken it." counseled on benefits, particularly in smokers.   Smokes - cigarettes when he drinks. Drank 1 pint w a friend recently in one sitting. 4 beers last night 16oz servings. No hx of alcohol withdrawal treatment, never had DTs or seizures. Smoked since 8 years ago, "only when he drinks." been clean for 37 years, went to [facility]. Hx of IVDU.   HTN - taking hyzaar, doesn't check BPs at home. No recent lightheadedness or HA.   Hx of Hep C- prescribed [name] several years ago, didn't pick this up 2/2 cost. Also says he was worried about side effects. He is willing to go back to ID.   ROS: Denies CP, SOB, abdominal pain, dysuria, changes in BMs.   CC, SH/smoking status, and VS noted  Objective: BP 124/70   Pulse 79   Temp 99 F (37.2 C) (Oral)   Ht '5\' 10"'$  (1.778 m)   Wt 173 lb 6.4 oz (78.7 kg)   SpO2 98%   BMI 24.88 kg/m  Gen: NAD, alert, cooperative, and pleasant. HEENT: NCAT, EOMI, PERRL CV: RRR, no murmur Resp: CTAB, no wheezes, non-labored Abd: SNTND, BS present, no guarding or organomegaly Ext: No edema, warm Neuro: Alert and oriented, Speech clear, No gross deficits  Assessment and plan:  Essential hypertension Well controlled, continue hyzaar. Recheck K and Cr today.  Chronic hepatitis C without hepatic coma (HCC) Referred back to ID, counseled that they have pharmacy help who can assist with med affordability. Also get CMP for LFTs and CBC.   Alcohol abuse Patient states this is not a problem for him today. Seems precontemplative. Will continue to offer aid for cessation when he is ready.   Orders Placed This Encounter  Procedures  . Pneumococcal conjugate  vaccine 13-valent IM  . CBC  . CMP14+EGFR  . Lipid panel  . Ambulatory referral to Ophthalmology    Referral Priority:   Routine    Referral Type:   Consultation    Referral Reason:   Specialty Services Required    Requested Specialty:   Ophthalmology    Number of Visits Requested:   1  . Ambulatory referral to Infectious Disease    Referral Priority:   Routine    Referral Type:   Consultation    Referral Reason:   Specialty Services Required    Requested Specialty:   Infectious Diseases    Number of Visits Requested:   1    Meds ordered this encounter  Medications  . DISCONTD: Tdap (BOOSTRIX) 5-2.5-18.5 LF-MCG/0.5 injection    Sig: Inject 0.5 mLs into the muscle once for 1 dose.    Dispense:  0.5 mL    Refill:  0  . Tdap (BOOSTRIX) 5-2.5-18.5 LF-MCG/0.5 injection    Sig: Inject 0.5 mLs into the muscle once for 1 dose.    Dispense:  0.5 mL    Refill:  0    Health Maintenance reviewed - patient declines flu shot as above.  Ralene Ok, MD, PGY3 12/25/2017 11:38 AM

## 2017-12-21 LAB — CMP14+EGFR
A/G RATIO: 1.3 (ref 1.2–2.2)
ALT: 31 IU/L (ref 0–44)
AST: 51 IU/L — AB (ref 0–40)
Albumin: 4.8 g/dL (ref 3.6–4.8)
Alkaline Phosphatase: 96 IU/L (ref 39–117)
BILIRUBIN TOTAL: 1.2 mg/dL (ref 0.0–1.2)
BUN/Creatinine Ratio: 7 — ABNORMAL LOW (ref 10–24)
BUN: 7 mg/dL — ABNORMAL LOW (ref 8–27)
CALCIUM: 9.9 mg/dL (ref 8.6–10.2)
CHLORIDE: 93 mmol/L — AB (ref 96–106)
CO2: 24 mmol/L (ref 20–29)
Creatinine, Ser: 0.95 mg/dL (ref 0.76–1.27)
GFR calc Af Amer: 97 mL/min/{1.73_m2} (ref >59)
GFR, EST NON AFRICAN AMERICAN: 84 mL/min/{1.73_m2} (ref >59)
GLOBULIN, TOTAL: 3.6 g/dL (ref 1.5–4.5)
Glucose: 80 mg/dL (ref 65–99)
POTASSIUM: 3.9 mmol/L (ref 3.5–5.2)
SODIUM: 133 mmol/L — AB (ref 134–144)
Total Protein: 8.4 g/dL (ref 6.0–8.5)

## 2017-12-21 LAB — CBC
HEMATOCRIT: 40.9 % (ref 37.5–51.0)
Hemoglobin: 14.6 g/dL (ref 13.0–17.7)
MCH: 36.2 pg — AB (ref 26.6–33.0)
MCHC: 35.7 g/dL (ref 31.5–35.7)
MCV: 102 fL — AB (ref 79–97)
PLATELETS: 264 10*3/uL (ref 150–450)
RBC: 4.03 x10E6/uL — ABNORMAL LOW (ref 4.14–5.80)
RDW: 11.7 % — AB (ref 12.3–15.4)
WBC: 6.4 10*3/uL (ref 3.4–10.8)

## 2017-12-21 LAB — LIPID PANEL
CHOL/HDL RATIO: 1.5 ratio (ref 0.0–5.0)
Cholesterol, Total: 176 mg/dL (ref 100–199)
HDL: 115 mg/dL (ref >39)
LDL Calculated: 28 mg/dL (ref 0–99)
TRIGLYCERIDES: 163 mg/dL — AB (ref 0–149)
VLDL Cholesterol Cal: 33 mg/dL (ref 5–40)

## 2017-12-25 NOTE — Assessment & Plan Note (Signed)
Patient states this is not a problem for him today. Seems precontemplative. Will continue to offer aid for cessation when he is ready.

## 2017-12-25 NOTE — Assessment & Plan Note (Signed)
Referred back to ID, counseled that they have pharmacy help who can assist with med affordability. Also get CMP for LFTs and CBC.

## 2017-12-25 NOTE — Assessment & Plan Note (Signed)
Well controlled, continue hyzaar. Recheck K and Cr today.

## 2017-12-27 ENCOUNTER — Telehealth: Payer: Self-pay

## 2017-12-27 NOTE — Telephone Encounter (Signed)
Spoke to pt and informed him of the information below. Pt had good understanding of information. Ottis Stain, CMA

## 2017-12-27 NOTE — Telephone Encounter (Signed)
-----   Message from Sela Hilding, MD sent at 12/25/2017 11:40 AM EDT ----- Called patient to relay results. No answer, left VM to call back. If he returns call, his blood counts are normal. He has a very mild elevation of his liver function numbers, which can come with drinking alcohol and he should decrease the amount he drinks.

## 2018-01-07 ENCOUNTER — Encounter: Payer: Self-pay | Admitting: Internal Medicine

## 2018-01-07 ENCOUNTER — Ambulatory Visit (INDEPENDENT_AMBULATORY_CARE_PROVIDER_SITE_OTHER): Payer: Medicare Other | Admitting: Internal Medicine

## 2018-01-07 VITALS — BP 142/86 | Temp 98.6°F | Wt 169.0 lb

## 2018-01-07 DIAGNOSIS — F1010 Alcohol abuse, uncomplicated: Secondary | ICD-10-CM | POA: Diagnosis not present

## 2018-01-07 DIAGNOSIS — Z23 Encounter for immunization: Secondary | ICD-10-CM | POA: Diagnosis not present

## 2018-01-07 DIAGNOSIS — B182 Chronic viral hepatitis C: Secondary | ICD-10-CM | POA: Diagnosis not present

## 2018-01-07 DIAGNOSIS — Z7185 Encounter for immunization safety counseling: Secondary | ICD-10-CM

## 2018-01-07 DIAGNOSIS — Z7189 Other specified counseling: Secondary | ICD-10-CM | POA: Diagnosis not present

## 2018-01-07 NOTE — Patient Instructions (Signed)
Date 01/07/18  Dear Jeffrey [name], As discussed in the [facility], your hepatitis C therapy will include the following medications:          [name] 90mg /400mg  tablet:           Take 1 tablet by mouth once daily   Please note that ALL MEDICATIONS WILL START ON THE SAME DATE for a total of 12 weeks. ---------------------------------------------------------------- Your HCV Treatment Start Date: TBA   Your HCV genotype:  1b    Liver Fibrosis: TBD    ---------------------------------------------------------------- YOUR PHARMACY CONTACT:   [facility] [address] [place], [place] [address] Phone: [phone] Hours: Monday to Friday 7:30 am to 6:00 pm   Please always contact your pharmacy at least 3-4 business days before you run out of medications to ensure your next month's medication is ready or 1 week prior to running out if you receive it by mail.  Remember, each prescription is for 28 days. ---------------------------------------------------------------- GENERAL NOTES REGARDING YOUR HEPATITIS C MEDICATION:  SOFOSBUVIR/LEDIPASVIR ([name]): - [name] tablet is taken daily with OR without food. - The tablets are orange. - The tablets should be stored at room temperature.  - Acid reducing agents such as H2 blockers (ie. Pepcid (famotidine), Zantac (ranitidine), Tagamet (cimetidine), Axid (nizatidine) and proton pump inhibitors (ie. Prilosec (omeprazole), Protonix (pantoprazole), Nexium (esomeprazole), or Aciphex (rabeprazole)) can decrease effectiveness of [name]. Do not take until you have discussed with a health care provider.    -Antacids that contain magnesium and/or aluminum hydroxide (ie. Milk of Magensia, Rolaids, Gaviscon, Maalox, Mylanta, an dArthritis Pain Formula)can reduce absorption of [name], so take them at least 4 hours before or after [name].  -Calcium carbonate (calcium supplements or antacids such as Tums, Caltrate, Os-Cal)needs to be taken  at least 4 hours hours before or after [name].  -St. John's wort or any products that contain St. John's wort like some herbal supplements  Please inform the office prior to starting any of these medications.  - The common side effects associated with [name] include:      1. Fatigue      2. Headache      3. Nausea      4. Diarrhea      5. Insomnia  Please note that this only lists the most common side effects and is NOT a comprehensive list of the potential side effects of these medications. For more information, please review the drug information sheets that come with your medication package from the pharmacy.  ---------------------------------------------------------------- GENERAL HELPFUL HINTS ON HCV THERAPY: 1. Stay well-hydrated. 2. Notify the ID Clinic of any changes in your other over-the-counter/herbal or prescription medications. 3. If you miss a dose of your medication, take the missed dose as soon as you remember. Return to your regular time/dose schedule the next day.  4.  Do not stop taking your medications without first talking with your healthcare provider. 5.  You may take Tylenol (acetaminophen), as long as the dose is less than 2000 mg (OR no more than 4 tablets of the Tylenol Extra Strengths 500mg  tablet) in 24 hours. 6.  You will see our pharmacist-specialist within the first 2 weeks of starting your medication to monitor for any possible side effects. 7.  You will have labs once during treatment, after soon after treatment completion and one final lab 6 months after treatment completion to verify the virus is out of your system.  Thayer Headings, [place] [facility] Infectious Diseases [facility] [address] [place], [place]  [id number] [phone]

## 2018-01-07 NOTE — Progress Notes (Signed)
   Subjective:    Patient ID: Jeffrey [name], male    DOB: Mar 14, 1951, 67 y.o.   MRN: [id number]  HPI Here for follow up of chronic hepatitis C. I saw him about 2 1/2 years ago as a new patient for hepatitis C but he never started treatment due to concerns of cost and side effects. He though returns now and interested in treatment.  He continues to drink alcohol though has not had anything for about 1 week.  No known history of cirrhosis.  No new concerns.     Review of Systems  Constitutional: Negative for fatigue.  Musculoskeletal: Negative for myalgias.  Skin: Negative for rash.       Objective:   Physical Exam  Constitutional: He appears well-developed and well-nourished. No distress.  HENT:  Mouth/Throat: No oropharyngeal exudate.  Eyes: No scleral icterus.  Cardiovascular: Normal rate, regular rhythm and normal heart sounds.  No murmur heard. Pulmonary/Chest: Effort normal and breath sounds normal. No respiratory distress.  Skin: No rash noted.   FH: history of alcoholic cirrhosis in multiple family members.       Assessment & Plan:

## 2018-01-07 NOTE — Assessment & Plan Note (Signed)
Discussed cessation 

## 2018-01-07 NOTE — Assessment & Plan Note (Signed)
Discussed vaccines including hepatitis A and given today. Refused flu shot

## 2018-01-07 NOTE — Addendum Note (Signed)
Addended by: Lenore Cordia on: 01/07/2018 11:40 AM   Modules accepted: Orders

## 2018-01-07 NOTE — Assessment & Plan Note (Signed)
I wil update his labs and consider harvoni once labs and ultrasound done

## 2018-01-09 ENCOUNTER — Ambulatory Visit (HOSPITAL_COMMUNITY)
Admission: RE | Admit: 2018-01-09 | Discharge: 2018-01-09 | Disposition: A | Discharge status: Home / Self Care | Payer: Medicare Other | Source: Ambulatory Visit | Attending: Internal Medicine | Admitting: Internal Medicine

## 2018-01-09 DIAGNOSIS — B182 Chronic viral hepatitis C: Secondary | ICD-10-CM | POA: Diagnosis present

## 2018-01-09 DIAGNOSIS — K7689 Other specified diseases of liver: Secondary | ICD-10-CM | POA: Diagnosis not present

## 2018-01-11 ENCOUNTER — Other Ambulatory Visit: Payer: Self-pay | Admitting: Internal Medicine

## 2018-01-11 LAB — COMPLETE METABOLIC PANEL WITH GFR
AG Ratio: 1.1 (calc) (ref 1.0–2.5)
ALKALINE PHOSPHATASE (APISO): 90 U/L (ref 40–115)
ALT: 34 U/L (ref 9–46)
AST: 55 U/L — ABNORMAL HIGH (ref 10–35)
Albumin: 4.7 g/dL (ref 3.6–5.1)
BUN: 9 mg/dL (ref 7–25)
CO2: 23 mmol/L (ref 20–32)
CREATININE: 0.94 mg/dL (ref 0.70–1.25)
Calcium: 10.1 mg/dL (ref 8.6–10.3)
Chloride: 97 mmol/L — ABNORMAL LOW (ref 98–110)
GFR, Est African American: 98 mL/min/{1.73_m2} (ref >=60)
GFR, Est Non African American: 85 mL/min/{1.73_m2} (ref >=60)
Globulin: 4.1 g/dL (calc) — ABNORMAL HIGH (ref 1.9–3.7)
Glucose, Bld: 83 mg/dL (ref 65–99)
Potassium: 4.1 mmol/L (ref 3.5–5.3)
Sodium: 132 mmol/L — ABNORMAL LOW (ref 135–146)
Total Bilirubin: 1.2 mg/dL (ref 0.2–1.2)
Total Protein: 8.8 g/dL — ABNORMAL HIGH (ref 6.1–8.1)

## 2018-01-11 LAB — LIVER FIBROSIS, FIBROTEST-ACTITEST
ALT: 32 U/L (ref 9–46)
APOLIPOPROTEIN A1: 257 mg/dL — AB (ref 94–176)
Alpha-2-Macroglobulin: 247 mg/dL (ref 106–279)
Bilirubin: 1 mg/dL (ref 0.2–1.2)
GGT: 182 U/L — ABNORMAL HIGH (ref 3–70)
Haptoglobin: 90 mg/dL (ref 43–212)

## 2018-01-11 LAB — CBC WITH DIFFERENTIAL/PLATELET
BASOS ABS: 40 {cells}/uL (ref 0–200)
Basophils Relative: 0.7 %
EOS PCT: 0.4 %
Eosinophils Absolute: 23 cells/uL (ref 15–500)
HEMATOCRIT: 43.4 % (ref 38.5–50.0)
Hemoglobin: 15.4 g/dL (ref 13.2–17.1)
LYMPHS ABS: 1003 {cells}/uL (ref 850–3900)
MCH: 36.3 pg — ABNORMAL HIGH (ref 27.0–33.0)
MCHC: 35.5 g/dL (ref 32.0–36.0)
MCV: 102.4 fL — AB (ref 80.0–100.0)
MPV: 10.8 fL (ref 7.5–12.5)
Monocytes Relative: 12.1 %
NEUTROS PCT: 69.2 %
Neutro Abs: 3944 cells/uL (ref 1500–7800)
PLATELETS: 255 10*3/uL (ref 140–400)
RBC: 4.24 10*6/uL (ref 4.20–5.80)
RDW: 11.9 % (ref 11.0–15.0)
TOTAL LYMPHOCYTE: 17.6 %
WBC mixed population: 690 cells/uL (ref 200–950)
WBC: 5.7 10*3/uL (ref 3.8–10.8)

## 2018-01-11 LAB — HCV RNA,QN PCR RFLX GENO, LIPABAD
HCV RNA, PCR, QN (LOG): 6.91 {Log_IU}/mL — AB
HCV RNA, PCR, QN: 8200000 [IU]/mL — AB

## 2018-01-11 LAB — PROTIME-INR
INR: 1.1
Prothrombin Time: 11.3 s (ref 9.0–11.5)

## 2018-01-11 LAB — HIV ANTIBODY (ROUTINE TESTING W REFLEX): HIV: NONREACTIVE

## 2018-01-11 LAB — HEPATITIS C GENOTYPE

## 2018-01-11 MED ORDER — LEDIPASVIR-SOFOSBUVIR 90-400 MG PO TABS: 1.0000 | ORAL_TABLET | Freq: Every day | ORAL | 2 refills | Status: DC

## 2018-01-16 ENCOUNTER — Encounter: Payer: Self-pay | Admitting: Pharmacy Technician

## 2018-01-16 ENCOUNTER — Telehealth: Payer: Self-pay

## 2018-01-16 MED FILL — HARVONI 90-400 MG TABLET: 90-400 | 28 days supply | Qty: 28 | Fill #0

## 2018-01-16 NOTE — Telephone Encounter (Signed)
Spoke with Mr. Jeffrey [name] today about starting [name]. I counseled him on how to take this and the expected side effects (headache, nausea, fatigue). I emphasized to him the importance of not missing any doses in order to give him the best chances for cure. He asked me whether or not he could drink any alcohol while on [name] and I encouraged him not to drink at all in order to prevent further liver damage. He verbalized understanding. I also asked him to call us before starting any new medication so we can check for interactions. He should hopefully receive his first bottle of [name] tomorrow (9/26) and plans to start once he receives it. He will call us on the day he plans to start [place] so we can document that. He will follow-up with Dr. Linus Salmons on 10/23.  Jackson Latino, PharmD PGY1 Pharmacy Resident Phone [phone] 01/16/2018     9:45 AM

## 2018-01-18 ENCOUNTER — Telehealth: Payer: Self-pay | Admitting: Behavioral Health

## 2018-01-18 NOTE — Telephone Encounter (Signed)
Mr. Jeffrey [name] called today to to speak to someone in the pharmacy.  He states he was told to call when he was able to pick up his [name].  He states he has the medication and wanted to call to let pharmacy know he was about to start it. [name] RN

## 2018-01-21 ENCOUNTER — Other Ambulatory Visit: Payer: Self-pay

## 2018-01-21 ENCOUNTER — Telehealth: Payer: Self-pay

## 2018-01-21 NOTE — Patient Outreach (Signed)
[facility]) Care Management  01/21/2018  Jeffrey [name] June 24, 1950 [id number]   Medication Adherence call to Mr. Jeffrey [name] spoke with patient he still has medication for about another month on Losartan/Hctz 100/25 mg he said he does not need any at this time. Mr. Jeffrey [name] is showing past due under [place].   [place] Management Direct Dial [phone]  Fax [phone] [email or web address]

## 2018-01-21 NOTE — Telephone Encounter (Signed)
Jeffrey [name] called today to let us know that he received [name] this past Saturday, 9/28. He started his medication today, 9/30. I reviewed with him the common side effects, that he could take [name] with or without food, and that he would be on this for 12 weeks. He asked again about whether or not he could drink any alcohol while on [name] and I confirmed that he should not as it would contribute to further liver damage. He verbalized understanding. Jeffrey [name] will follow-up with Dr. Linus Salmons on 10/23 at [place], PharmD PGY1 Pharmacy Resident Phone [phone] 01/21/2018     9:03 AM

## 2018-01-28 ENCOUNTER — Ambulatory Visit: Payer: Medicare Other | Admitting: Internal Medicine

## 2018-02-13 ENCOUNTER — Encounter: Payer: Self-pay | Admitting: Internal Medicine

## 2018-02-13 ENCOUNTER — Ambulatory Visit (INDEPENDENT_AMBULATORY_CARE_PROVIDER_SITE_OTHER): Payer: Medicare Other | Admitting: Internal Medicine

## 2018-02-13 VITALS — BP 135/89 | HR 77 | Temp 98.3°F | Ht 70.0 in | Wt 177.0 lb

## 2018-02-13 DIAGNOSIS — K7400 Hepatic fibrosis, unspecified: Secondary | ICD-10-CM | POA: Insufficient documentation

## 2018-02-13 DIAGNOSIS — F1010 Alcohol abuse, uncomplicated: Secondary | ICD-10-CM | POA: Diagnosis not present

## 2018-02-13 DIAGNOSIS — Z7189 Other specified counseling: Secondary | ICD-10-CM

## 2018-02-13 DIAGNOSIS — H2513 Age-related nuclear cataract, bilateral: Secondary | ICD-10-CM | POA: Diagnosis not present

## 2018-02-13 DIAGNOSIS — Z7185 Encounter for immunization safety counseling: Secondary | ICD-10-CM

## 2018-02-13 DIAGNOSIS — H524 Presbyopia: Secondary | ICD-10-CM | POA: Diagnosis not present

## 2018-02-13 DIAGNOSIS — H5203 Hypermetropia, bilateral: Secondary | ICD-10-CM | POA: Diagnosis not present

## 2018-02-13 DIAGNOSIS — H52201 Unspecified astigmatism, right eye: Secondary | ICD-10-CM | POA: Diagnosis not present

## 2018-02-13 DIAGNOSIS — B182 Chronic viral hepatitis C: Secondary | ICD-10-CM | POA: Diagnosis not present

## 2018-02-13 DIAGNOSIS — H25013 Cortical age-related cataract, bilateral: Secondary | ICD-10-CM | POA: Diagnosis not present

## 2018-02-13 NOTE — Assessment & Plan Note (Signed)
I discussed his alcohol intake, as above and he is encouraged to continue to cut down and get to complete cessation.

## 2018-02-13 NOTE — Assessment & Plan Note (Signed)
Doing well on treatment with really no side effects.  I will check his viral load today.

## 2018-02-13 NOTE — Progress Notes (Signed)
   Subjective:    Patient ID: Jeffrey [name], male    DOB: 11-Dec-1950, 67 y.o.   MRN: [id number]  HPI He is here for follow-up of chronic hepatitis C. I saw him last visit after a 2-1/2-year absence since he was concerned with medication costs and so did not ever want to get started on treatment.  He though now is interested in treatment and started [name] about 1 month ago.  He has no missed doses.  He is considerably cut down his alcohol intake though does drink some.  He had his elastography and ultrasound and his ultrasound with no cirrhosis and elastography only F2 to F3 fibrosis.  Additionally his platelet count has been normal and most recently 255,000.  He is pleased with being on the medication.  He is determined to continue to greatly reduce his alcohol and considering stopping altogether.   Review of Systems  Constitutional: Negative for fatigue.  Gastrointestinal: Negative for diarrhea.  Skin: Negative for rash.  Neurological: Negative for headaches.       Objective:   Physical Exam  Constitutional: He appears well-developed and well-nourished. No distress.  HENT:  Mouth/Throat: No oropharyngeal exudate.  Eyes: No scleral icterus.  Cardiovascular: Normal rate, regular rhythm and normal heart sounds.  No murmur heard. Pulmonary/Chest: Effort normal and breath sounds normal. No respiratory distress.  Skin: No rash noted.   SH: much less alcohol       Assessment & Plan:

## 2018-02-13 NOTE — Assessment & Plan Note (Signed)
He will be due for hepatitis A #2 next visit.  He refuses the flu shot.

## 2018-02-13 NOTE — Assessment & Plan Note (Signed)
I discussed the results of his elastography which overall is pretty good considering he has just moderate fibrosis.  I discussed with him that if he quits drinking altogether and we are successful in curing hepatitis C his liver should stay stable and he does not need ongoing monitoring.  He understands that this is partially dependent on abstaining from alcohol and he will continue to work on that.

## 2018-02-15 LAB — COMPLETE METABOLIC PANEL WITH GFR
AG Ratio: 1.2 (calc) (ref 1.0–2.5)
ALBUMIN MSPROF: 4.4 g/dL (ref 3.6–5.1)
ALKALINE PHOSPHATASE (APISO): 79 U/L (ref 40–115)
ALT: 8 U/L — ABNORMAL LOW (ref 9–46)
AST: 17 U/L (ref 10–35)
BUN: 8 mg/dL (ref 7–25)
CO2: 26 mmol/L (ref 20–32)
CREATININE: 0.88 mg/dL (ref 0.70–1.25)
Calcium: 9.8 mg/dL (ref 8.6–10.3)
Chloride: 97 mmol/L — ABNORMAL LOW (ref 98–110)
GFR, Est African American: 104 mL/min/{1.73_m2} (ref >=60)
GFR, Est Non African American: 90 mL/min/{1.73_m2} (ref >=60)
GLUCOSE: 73 mg/dL (ref 65–99)
Globulin: 3.7 g/dL (calc) (ref 1.9–3.7)
Potassium: 4.5 mmol/L (ref 3.5–5.3)
Sodium: 133 mmol/L — ABNORMAL LOW (ref 135–146)
Total Bilirubin: 0.8 mg/dL (ref 0.2–1.2)
Total Protein: 8.1 g/dL (ref 6.1–8.1)

## 2018-02-15 LAB — HEPATITIS C RNA QUANTITATIVE
HCV QUANT LOG: NOT DETECTED {Log_IU}/mL
HCV RNA, PCR, QN: NOT DETECTED [IU]/mL

## 2018-02-22 MED FILL — HARVONI 90-400 MG TABLET: 90-400 | 28 days supply | Qty: 28 | Fill #1

## 2018-03-04 ENCOUNTER — Ambulatory Visit (INDEPENDENT_AMBULATORY_CARE_PROVIDER_SITE_OTHER): Payer: Medicare Other | Admitting: Family Medicine

## 2018-03-04 ENCOUNTER — Other Ambulatory Visit: Payer: Self-pay

## 2018-03-04 ENCOUNTER — Other Ambulatory Visit: Payer: Self-pay | Admitting: *Deleted

## 2018-03-04 ENCOUNTER — Encounter: Payer: Self-pay | Admitting: Family Medicine

## 2018-03-04 ENCOUNTER — Ambulatory Visit: Payer: Medicare Other

## 2018-03-04 ENCOUNTER — Other Ambulatory Visit: Payer: Self-pay | Admitting: Family Medicine

## 2018-03-04 VITALS — BP 158/90 | HR 76 | Temp 98.1°F | Wt 174.0 lb

## 2018-03-04 DIAGNOSIS — R21 Rash and other nonspecific skin eruption: Secondary | ICD-10-CM

## 2018-03-04 MED ORDER — CETIRIZINE HCL 10 MG PO TABS: 10.0000 mg | ORAL_TABLET | Freq: Every day | ORAL | 11 refills | Status: DC

## 2018-03-04 MED ORDER — RANITIDINE HCL 150 MG PO TABS: 150.0000 mg | ORAL_TABLET | Freq: Two times a day (BID) | ORAL | 2 refills | Status: DC

## 2018-03-04 NOTE — Progress Notes (Signed)
  Subjective:    Patient ID: Jeffrey [name], male    DOB: 1950-07-27, 67 y.o.   MRN: [id number]   CC: rash and itching all over  HPI: Rash: Patient started [name] for hepatitis C about 1 month ago and 2 weeks ago started having rash on his legs and itching. Reports he is itching all over. Denies any trouble breathing or lip or throat swelling. He is seen by Dr. Alyson Ingles with ID.    Smoking status reviewed  ROS: 10 point ROS is otherwise negative, except as mentioned in HPI  Patient Active Problem List   Diagnosis Date Noted  . Rash and nonspecific skin eruption 03/06/2018  . Liver fibrosis 02/13/2018  . Tubular adenoma 12/26/2016  . Left hip pain 11/26/2016  . History of partial colectomy 05/18/2016  . Erectile dysfunction 05/18/2016  . Episodic recurrent vertigo 01/20/2016  . Chronic hepatitis C without hepatic coma ([place]) 06/23/2015  . Essential hypertension 04/12/2015  . Arthritis of knee 04/12/2015  . Alcohol abuse 04/12/2015     Objective:  BP (!) 158/90   Pulse 76   Temp 98.1 F (36.7 C) (Oral)   Wt 174 lb (78.9 kg)   SpO2 99%   BMI 24.97 kg/m  Vitals and nursing note reviewed  General: NAD, pleasant, well-appearing Respiratory:  normal effort Extremities: no edema or cyanosis. WWP. Skin: warm and dry, pruritic erythematous patches scattered on BLLE and trunk with no purulence or discharge Neuro: alert and oriented, no focal deficits Psych: normal affect  Assessment & Plan:    Rash and nonspecific skin eruption Possibly urticaria from new drug regimen for Hep C, but could also be itching from hep C or tx. Patient given zyrtec, ranitidine for itching and rash. Patient to go to ED if develops trouble breathing or any swelling of lips. Patient also to discuss with ID doctor regarding hep C treatment.  -Will obtain CMP to ensure normal LFT's and that liver failure from tx is not cause of rash. -Will forward note to Dr. Linus Salmons who saw patient last.  -Patient to  continue Hep C treatment unless ID tells patient to discontinue  Jeffrey [name], [place] Resident PGY-2

## 2018-03-04 NOTE — Patient Instructions (Addendum)
Thank you for coming to see me today. It was a pleasure! Today we talked about:   Please take zyrtec and ranitidine daily for the itching. I have sent these to your pharmacy.   We will call you with your results. I will also forward this note to your infectious disease doctor to ensure that he is aware of this drug reaction.   If you develop any swelling of your lips, have trouble breathing or start having fevers, please come in or go to the emergency room.   Please follow-up with your regular doctor as needed.  If you have any questions or concerns, please do not hesitate to call the office at [phone].  Take Care,   [place] Landon Bassford, DO

## 2018-03-05 LAB — CMP14+EGFR

## 2018-03-06 ENCOUNTER — Encounter: Payer: Self-pay | Admitting: Family Medicine

## 2018-03-06 DIAGNOSIS — R21 Rash and other nonspecific skin eruption: Secondary | ICD-10-CM | POA: Insufficient documentation

## 2018-03-06 NOTE — Assessment & Plan Note (Addendum)
Possibly urticaria from new drug regimen for Hep C, but could also be itching from hep C or tx. Patient given zyrtec, ranitidine for itching and rash. Patient to go to ED if develops trouble breathing or any swelling of lips. Patient also to discuss with ID doctor regarding hep C treatment.  -Will obtain CMP to ensure normal LFT's and that liver failure from tx is not cause of rash. -Will forward note to Dr. Linus Salmons who saw patient last.  -Patient to continue Hep C treatment unless ID tells patient to discontinue

## 2018-03-07 ENCOUNTER — Telehealth: Payer: Self-pay | Admitting: Pharmacist

## 2018-03-07 NOTE — Telephone Encounter (Signed)
Can someone please call this patient and relay Dr. Henreitta Leber message. He was very adamant about not wanting to talk about a pharmacist and was yelling and upset. Thank you.

## 2018-03-07 NOTE — Telephone Encounter (Signed)
Called patient to relay Dr. Henreitta Leber message.Patient is upset stating he does not feel like he should take medication anymore. Patient would like to schedule an appointment to see Dr. Linus Salmons to discuss medication. [place]

## 2018-03-07 NOTE — Telephone Encounter (Signed)
I received a call from [facility] regarding concern that [name] is causing generalized itching. He was seen by family medicine on 11/11 and prescribed ranitidine and cetirizine, which he said helps initially but once they start to wear off, the itching returns. He says that he has never had this issue before and therefore is convinced it is due to the [place]. I informed Khalee that itching is not a common side effect of [name] and that since he has been taking it for about a month and a half now, it would be very unlikely to be causing itching just within the past couple of weeks. I advised him that he should continue taking the medication. [place] was not satisfied and continued to say that this has never happened before and there was no way that I could say it was not related to [place]. I explained that side effects are monitored during and after clinical trials and that itching was not reported in any patients taking [name] so it would be very unlikely. [place] insisted that people can be different, to which I agreed, but also continued to say that there was no way I could say the itching is not related to [facility] and that I am "just a pharmacist." I told Everson I would forward his concerns on to Dr. Linus Salmons to get his opinion.

## 2018-03-07 NOTE — Telephone Encounter (Signed)
Thanks, I agree.  Itching not likely from [facility], regardless I would push through as it would be hard to get another treatment and he is relatively close to finishing.

## 2018-03-08 NOTE — Telephone Encounter (Signed)
Scheduled with Dr Linus Salmons 11/19 at 8:45. He is going to continue the [place] until then. Landis Gandy, RN

## 2018-03-12 ENCOUNTER — Encounter: Payer: Self-pay | Admitting: Internal Medicine

## 2018-03-12 ENCOUNTER — Ambulatory Visit (INDEPENDENT_AMBULATORY_CARE_PROVIDER_SITE_OTHER): Payer: Medicare Other | Admitting: Internal Medicine

## 2018-03-12 DIAGNOSIS — B182 Chronic viral hepatitis C: Secondary | ICD-10-CM | POA: Diagnosis not present

## 2018-03-12 DIAGNOSIS — R21 Rash and other nonspecific skin eruption: Secondary | ICD-10-CM | POA: Diagnosis not present

## 2018-03-12 DIAGNOSIS — F1010 Alcohol abuse, uncomplicated: Secondary | ICD-10-CM

## 2018-03-12 MED FILL — LOSARTAN-HCTZ 100-25 MG TAB: 100-25 | 30 days supply | Qty: 30 | Fill #0

## 2018-03-12 NOTE — Progress Notes (Signed)
   Subjective:    Patient ID: Jeffrey [name], male    DOB: [birth year], 67 y.o.   MRN: [id number]  HPI Here for a work in visit for rash.   He was recently seen by his PCP and given H2 blockers and still with significant itching.  Has diffuse pruritic papules covering his body.  Can't sleep with the itch.  No other side effects.  Recent HCV RNA undetectable.  No associated fatigue or diarrhea.     Review of Systems  Constitutional: Negative for fatigue.  Skin: Positive for rash.  Neurological: Negative for headaches.       Objective:   Physical Exam  Constitutional: He appears well-developed and well-nourished. No distress.  Eyes: No scleral icterus.  Musculoskeletal: He exhibits no edema.  Skin:  Diffuse pappilary circular rash, non confluent   SH: + tobacco and not interested in quitting       Assessment & Plan:

## 2018-03-12 NOTE — Patient Instructions (Signed)
Take OTC Benadryl 50 mg at night for itching

## 2018-03-12 NOTE — Assessment & Plan Note (Signed)
Much less alcohol intake.  Encouraged continued efforts at cessation

## 2018-03-12 NOTE — Assessment & Plan Note (Signed)
This is a new problem.  Diffuse and very itchy.  It may be related to [name], some case reports.  He will try Benadryl at night.

## 2018-03-12 NOTE — Assessment & Plan Note (Signed)
His viral load during treatment is negative.  At this point, with his significant rash that I suspect is related to [place], and some success with treatment with just 8 weeks, he will complete 8 weeks and stop. He still has a very good chance of acieving cure, though lower than with 12 weeks, and can use different medication for relapse if needed.   He will follow up in 1 month

## 2018-03-12 NOTE — Progress Notes (Signed)
States his body is covered in "itchy" rash. Patient has continued to take [facility]

## 2018-04-15 ENCOUNTER — Encounter: Payer: Self-pay | Admitting: Internal Medicine

## 2018-04-15 ENCOUNTER — Ambulatory Visit (INDEPENDENT_AMBULATORY_CARE_PROVIDER_SITE_OTHER): Payer: Medicare Other | Admitting: Internal Medicine

## 2018-04-15 VITALS — BP 128/83 | HR 84 | Temp 98.0°F | Ht 70.0 in | Wt 176.0 lb

## 2018-04-15 DIAGNOSIS — R21 Rash and other nonspecific skin eruption: Secondary | ICD-10-CM | POA: Diagnosis not present

## 2018-04-15 DIAGNOSIS — B182 Chronic viral hepatitis C: Secondary | ICD-10-CM

## 2018-04-15 DIAGNOSIS — K7400 Hepatic fibrosis, unspecified: Secondary | ICD-10-CM

## 2018-04-15 NOTE — Progress Notes (Signed)
   Subjective:    Patient ID: Jeffrey [name], male    DOB: October 18, 1950, 67 y.o.   MRN: [id number]  HPI Here for follow up of chronic hepatitis C. Completed 8 weeks of [name] for genotype 1 chronic hepatitis C.  He was unable to do 12 weeks due to rash that was very bothersome.  His rash is now nearly gone and no further itching.   Stopped about 1 month ago.  No associated fatigue and feels he has more energy.    Review of Systems  Constitutional: Negative for fatigue and fever.  Gastrointestinal: Negative for diarrhea.  Skin: Negative for rash.  Neurological: Negative for dizziness.       Objective:   Physical Exam Constitutional:      Appearance: Normal appearance.  Eyes:     General: No scleral icterus. Cardiovascular:     Rate and Rhythm: Normal rate and regular rhythm.  Pulmonary:     Effort: Pulmonary effort is normal. No respiratory distress.     Breath sounds: Normal breath sounds.  Neurological:     Mental Status: He is alert.    SH: still drinking some       Assessment & Plan:

## 2018-04-15 NOTE — Assessment & Plan Note (Signed)
Now resolved off of [name]

## 2018-04-15 NOTE — Assessment & Plan Note (Signed)
Some fibrosis and discussed avoiding alcohol.  [place] screening not indicated.

## 2018-04-15 NOTE — Assessment & Plan Note (Signed)
Will check RNA today and rtc 3 months to repeat.

## 2018-04-19 LAB — COMPLETE METABOLIC PANEL WITH GFR
AG Ratio: 1.3 (calc) (ref 1.0–2.5)
ALBUMIN MSPROF: 4.6 g/dL (ref 3.6–5.1)
ALT: 8 U/L — ABNORMAL LOW (ref 9–46)
AST: 14 U/L (ref 10–35)
Alkaline phosphatase (APISO): 78 U/L (ref 40–115)
BUN: 9 mg/dL (ref 7–25)
CALCIUM: 10.1 mg/dL (ref 8.6–10.3)
CO2: 30 mmol/L (ref 20–32)
CREATININE: 0.93 mg/dL (ref 0.70–1.25)
Chloride: 94 mmol/L — ABNORMAL LOW (ref 98–110)
GFR, EST NON AFRICAN AMERICAN: 86 mL/min/{1.73_m2} (ref >=60)
GFR, Est African American: 99 mL/min/{1.73_m2} (ref >=60)
GLOBULIN: 3.5 g/dL (ref 1.9–3.7)
Glucose, Bld: 100 mg/dL — ABNORMAL HIGH (ref 65–99)
Potassium: 4 mmol/L (ref 3.5–5.3)
SODIUM: 130 mmol/L — AB (ref 135–146)
TOTAL PROTEIN: 8.1 g/dL (ref 6.1–8.1)
Total Bilirubin: 1.5 mg/dL — ABNORMAL HIGH (ref 0.2–1.2)

## 2018-04-19 LAB — HEPATITIS C RNA QUANTITATIVE
HCV Quantitative Log: <1.18 Log IU/mL
HCV RNA, PCR, QN: NOT DETECTED [IU]/mL

## 2018-05-16 MED FILL — LOSARTAN-HCTZ 100-25 MG TAB: 100-25 | 30 days supply | Qty: 30 | Fill #1

## 2018-05-22 DIAGNOSIS — R51 Headache: Secondary | ICD-10-CM | POA: Diagnosis not present

## 2018-05-22 DIAGNOSIS — R55 Syncope and collapse: Secondary | ICD-10-CM | POA: Diagnosis not present

## 2018-06-17 MED FILL — LOSARTAN-HCTZ 100-25 MG TAB: 100-25 | 90 days supply | Qty: 90 | Fill #2

## 2018-07-25 ENCOUNTER — Ambulatory Visit: Payer: Medicare Other | Admitting: Family Medicine

## 2018-07-25 ENCOUNTER — Telehealth (INDEPENDENT_AMBULATORY_CARE_PROVIDER_SITE_OTHER): Payer: Medicare Other | Admitting: Family Medicine

## 2018-07-25 ENCOUNTER — Other Ambulatory Visit: Payer: Self-pay

## 2018-07-25 DIAGNOSIS — K219 Gastro-esophageal reflux disease without esophagitis: Secondary | ICD-10-CM | POA: Insufficient documentation

## 2018-07-25 MED ORDER — FAMOTIDINE 10 MG PO TABS: 10.0000 mg | ORAL_TABLET | Freq: Two times a day (BID) | ORAL | 0 refills | Status: DC

## 2018-07-25 NOTE — Assessment & Plan Note (Addendum)
Symptoms most consistent with acid reflux especially given relief with eating food.  Denies history of this, however ranitidine present on medication list although patient denies taking this.  Certainly has risk factors with tobacco use. No red flags concerning for possible malignancy, no weight loss or difficulty swallowing. Will trial H2 blocker for a few weeks.  If this does not relieve symptoms would recommend patient coming for appointment for H. pylori testing.

## 2018-07-25 NOTE — Progress Notes (Signed)
[place] Telemedicine Visit  Patient consented to have visit conducted via telephone.  Encounter participants: Patient: Jeffrey [name]  Provider: Rory Percy  Others (if applicable): N/A  Chief Complaint: "stomach pain"  HPI:  Patient endorses diffuse, severe abdominal pain rated 8/10 that is achy and burning when he wakes up in the morning occasionally, this is been happening for the past couple of months.  He states when he eats breakfast the pain subsides.  He usually eats dinner around 10-11 PM and then goes to bed shortly after this.  He wakes up at 7 AM every morning.  Sleep is mostly uninterrupted.  Eats breakfast usually between 7:30-8:00 AM every day.  Occasionally he will wake up around 4-5 AM due to the same abdominal pain.  He will then sometimes eat something in order for the pain to go away.  The pain usually goes away 5 to 10 minutes after eating.  Denies any pain at any other times of day.  Reports sometimes he eats a "large full course meal" for dinner but does not think he eats too much.  For breakfast he usually has coffee, sausage, eggs, and grits.  He reports he does not take any medications for acid reflux.  Reports normal bowel movements.  Denies nausea, vomiting, fevers.  Denies blood in his stool or dark tarry stools.  Had colonoscopy in 2018 with 1 tubular adenomatous polyp removed in his ileocolonic anastomosis, has history of partial colectomy and 2012 in [place].  Colonoscopies every 5 years.  Patient is current smoker.  Denies any NSAID use.  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  Does have history of hepatitis C s/p [name] treatment, followed by [facility] with most recent appointment 03/2018.  ROS: per HPI  Pertinent PMHx: HTN, chronic Hep C with some liver fibrosis, knee arthritis now s/p knee replacement, alcohol use disorder, h/o partial colectomy, ED, tobacco use  Assessment/Plan:  Acid reflux Symptoms most  consistent with acid reflux especially given relief with eating food.  Denies history of this, however ranitidine present on medication list although patient denies taking this.  Certainly has risk factors with tobacco use. No red flags concerning for possible malignancy, no weight loss or difficulty swallowing. Will trial H2 blocker for a few weeks.  If this does not relieve symptoms would recommend patient coming for appointment for H. pylori testing.     Time spent on phone with patient: 17 minutes

## 2018-10-09 ENCOUNTER — Ambulatory Visit (INDEPENDENT_AMBULATORY_CARE_PROVIDER_SITE_OTHER): Payer: Medicare Other | Admitting: Family Medicine

## 2018-10-09 ENCOUNTER — Other Ambulatory Visit: Payer: Self-pay

## 2018-10-09 VITALS — BP 140/75 | HR 99 | Wt 164.2 lb

## 2018-10-09 DIAGNOSIS — I10 Essential (primary) hypertension: Secondary | ICD-10-CM | POA: Diagnosis not present

## 2018-10-09 DIAGNOSIS — R42 Dizziness and giddiness: Secondary | ICD-10-CM

## 2018-10-09 DIAGNOSIS — Z720 Tobacco use: Secondary | ICD-10-CM

## 2018-10-09 DIAGNOSIS — B182 Chronic viral hepatitis C: Secondary | ICD-10-CM

## 2018-10-09 DIAGNOSIS — F1010 Alcohol abuse, uncomplicated: Secondary | ICD-10-CM | POA: Diagnosis not present

## 2018-10-09 DIAGNOSIS — H8190 Unspecified disorder of vestibular function, unspecified ear: Secondary | ICD-10-CM

## 2018-10-09 DIAGNOSIS — K219 Gastro-esophageal reflux disease without esophagitis: Secondary | ICD-10-CM

## 2018-10-09 LAB — POCT GLYCOSYLATED HEMOGLOBIN (HGB A1C): Hemoglobin A1C: 5 % (ref 4.0–5.6)

## 2018-10-09 MED ORDER — MECLIZINE HCL 25 MG PO TABS: 25.0000 mg | ORAL_TABLET | Freq: Every day | ORAL | 3 refills | Status: DC | PRN

## 2018-10-09 MED ORDER — LOSARTAN POTASSIUM-HCTZ 100-25 MG PO TABS: 1.0000 | ORAL_TABLET | Freq: Every day | ORAL | 2 refills | Status: DC

## 2018-10-09 NOTE — Progress Notes (Signed)
[facility] Phone: [phone]   cc: balance issues  Subjective:  The patient has no complaints today, but hasn't been into the clinic in a while and would like to meet his PCP.  He has a recent diagnosis of hepatitis C for which he completed his course of antiviral medications.  He last saw infectious disease specialists in December.  He has not had any RUQ pain since this time.  He is also concerned about diabetes, as it runs in his family.    He recently had a teleconference visit with the [facility] for abdominal pain and was prescribed pepcid.  This improved on its own and he no longer takes the pepcid.   The patient drinks alcohol and smokes cigarettes.  He drinks about 3 times a week, with more than 6 beers at a time when he drinks. He will sometimes sdrink liquor as well.  He says he doesn't drink as much as he used to.  He usually only drinks when his friends come over.  Dr. Linus Salmons recommended inpatient treatment programs to him but he has not called them yet. The patient only smokes when he drinks, and he rolls his own cigarettes.  He estimates it's less than half a pack per week.  He has no desire to quit at this time.   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  His wife died of cancer and both his parents have passed a way and he says sometimes holidays 'are rough' but he has lots of friends who come by to keep him company.     Has balance issues.  In the morning he will feel unsteady on his feet, but this resolves and he does not have issues usually the rest of the day.He does not have the sensation of the room spinning when this occurs. He takes meclizine PRN for what he describes as dizziness when he drives sometimes.     ROS: See HPI for pertinent positives and negatives  Past Medical History  Family history reviewed for today's visit. No changes.  Objective: BP 140/75   Pulse 99   Wt 74.5 kg   SpO2 99%   BMI 23.56 kg/m   Gen: NAD, alert and oriented, cooperative with exam CV: normal rate, regular rhythm. No murmurs, no rubs.  Resp: LCTAB, no wheezes, crackles. normal work of breathing GI: nontender to palpation, BS present, no guarding or organomegaly Skin: No rashes, no lesions Psych: Appropriate behavior. Pt became upset when talking about his parents who are deceased.   Assessment/Plan: Tobacco use Patient currently has no desire to quit.  Discussed benefits of smoking cessation and informed pt of resources to help.    Acid reflux Resolved. No longer taking pepcid.    Episodic recurrent vertigo Likely related to alcohol use.  Takes meclizine prn and well controlled using this medication.  - refilled meclizine prn  Chronic hepatitis C without hepatic coma ([place]) Has completed his course of harvoni.  Pt would like to have his liver function assessed.   - cmp ordered.    Alcohol abuse Pt appears to have cut back significantly on alcohol use.  He states dr. Linus Salmons has provided him with information for rehab services but he has not taken advantage of it yet.  Encouraged patient to pursue these interventions and educated pt about importance of not drinking alcohol in the setting of liver disease.   Essential hypertension Compliant with his medication.  Needs  a refill. BP 140/75 this morning.   - refilled hyzaar 100-25mg     Clemetine Marker, MD PGY-1

## 2018-10-09 NOTE — Patient Instructions (Addendum)
Is a pleasure to see today.  I have refilled your prescription for Hyzaar and Antivert.  I will call you to inform you of the results of the CMP and A1c.  I agree with Dr. Linus Salmons that he would be best if you did not drink or severely reduced your alcohol consumption especially in light of your previous diagnosis of hepatitis C.  If you need additional information on places to assist you in quitting you can always call our office and I can provide some for you.  Have a great day  Clemetine Marker, MD

## 2018-10-10 LAB — COMPREHENSIVE METABOLIC PANEL
ALT: 16 IU/L (ref 0–44)
AST: 38 IU/L (ref 0–40)
Albumin/Globulin Ratio: 1.5 (ref 1.2–2.2)
Albumin: 4.8 g/dL (ref 3.8–4.8)
Alkaline Phosphatase: 85 IU/L (ref 39–117)
BUN/Creatinine Ratio: 11 (ref 10–24)
BUN: 10 mg/dL (ref 8–27)
Bilirubin Total: 0.8 mg/dL (ref 0.0–1.2)
CO2: 19 mmol/L — ABNORMAL LOW (ref 20–29)
Calcium: 9.6 mg/dL (ref 8.6–10.2)
Chloride: 101 mmol/L (ref 96–106)
Creatinine, Ser: 0.95 mg/dL (ref 0.76–1.27)
GFR calc Af Amer: 96 mL/min/{1.73_m2} (ref >59)
GFR calc non Af Amer: 83 mL/min/{1.73_m2} (ref >59)
Globulin, Total: 3.1 g/dL (ref 1.5–4.5)
Glucose: 67 mg/dL (ref 65–99)
Potassium: 4 mmol/L (ref 3.5–5.2)
Sodium: 137 mmol/L (ref 134–144)
Total Protein: 7.9 g/dL (ref 6.0–8.5)

## 2018-10-12 ENCOUNTER — Telehealth: Payer: Self-pay | Admitting: Family Medicine

## 2018-10-12 ENCOUNTER — Encounter: Payer: Self-pay | Admitting: Family Medicine

## 2018-10-12 DIAGNOSIS — Z720 Tobacco use: Secondary | ICD-10-CM | POA: Insufficient documentation

## 2018-10-12 NOTE — Assessment & Plan Note (Signed)
Resolved. No longer taking pepcid.

## 2018-10-12 NOTE — Assessment & Plan Note (Signed)
Patient currently has no desire to quit.  Discussed benefits of smoking cessation and informed pt of resources to help.

## 2018-10-12 NOTE — Assessment & Plan Note (Addendum)
Compliant with his medication.  Needs a refill. BP 140/75 this morning.   - refilled hyzaar 100-25mg 

## 2018-10-12 NOTE — Assessment & Plan Note (Signed)
Likely related to alcohol use.  Takes meclizine prn and well controlled using this medication.  - refilled meclizine prn

## 2018-10-12 NOTE — Assessment & Plan Note (Signed)
Has completed his course of harvoni.  Pt would like to have his liver function assessed.   - cmp ordered.

## 2018-10-12 NOTE — Assessment & Plan Note (Signed)
Pt appears to have cut back significantly on alcohol use.  He states dr. Linus Salmons has provided him with information for rehab services but he has not taken advantage of it yet.  Encouraged patient to pursue these interventions and educated pt about importance of not drinking alcohol in the setting of liver disease.

## 2018-10-12 NOTE — Telephone Encounter (Signed)
Called pt to inform him of his results of cmp and a1c, which were normal. He was very appreciative.

## 2018-11-22 IMAGING — US US ABDOMEN LIMITED W/ ELASTOGRAPHY
1 series · 13 of 25 positions shown · non-contrast
Comparison: None.

CLINICAL DATA: Chronic hepatitis-C without hepatic coma

EXAM:
US ABDOMEN LIMITED - RIGHT UPPER QUADRANT
ULTRASOUND HEPATIC ELASTOGRAPHY
TECHNIQUE: Limited right upper quadrant abdominal ultrasound was performed. In
addition, ultrasound elastography evaluation of the liver was
performed. A region of interest was placed in the right lobe of the
liver. Following application of a compressive sonographic pulse,
shear waves were detected in the adjacent hepatic tissue and the
shear wave velocity was calculated. Multiple assessments were
performed at the selected site. Median shear wave velocity is
correlated to a Metavir fibrosis score.

[Series 1: us abdomen limited w/ elastography · 0.15mm/px · 13 of 76 slices shown]
[im 1/76]
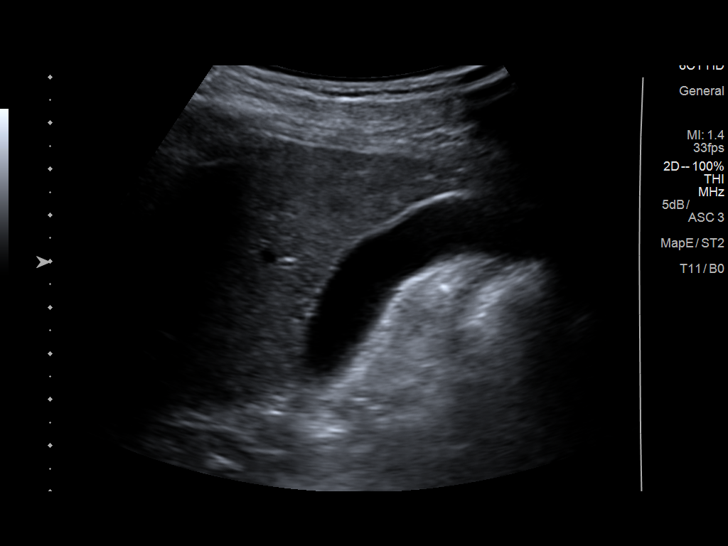
[im 7/76]
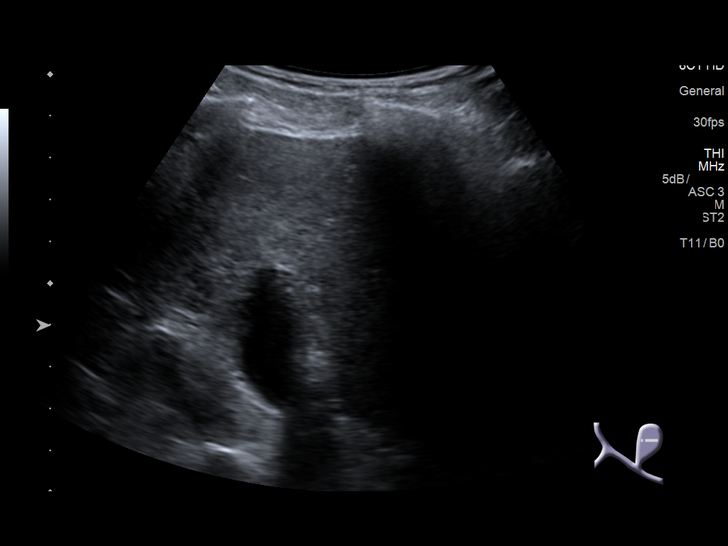
[im 13/76]
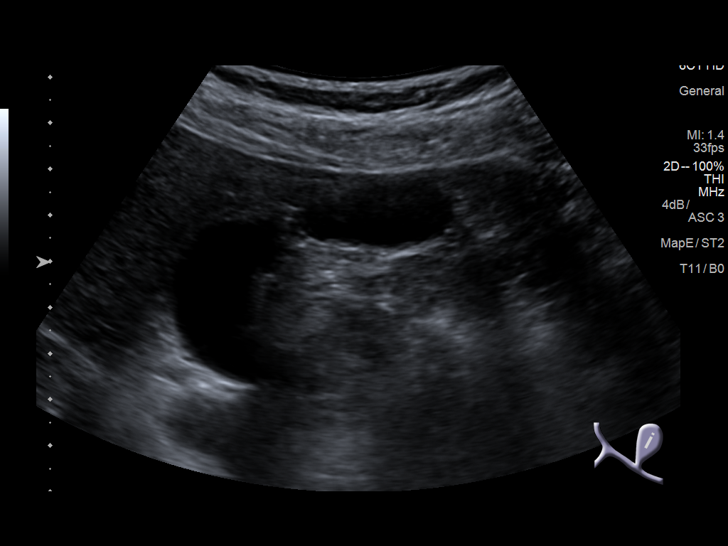
[im 19/76]
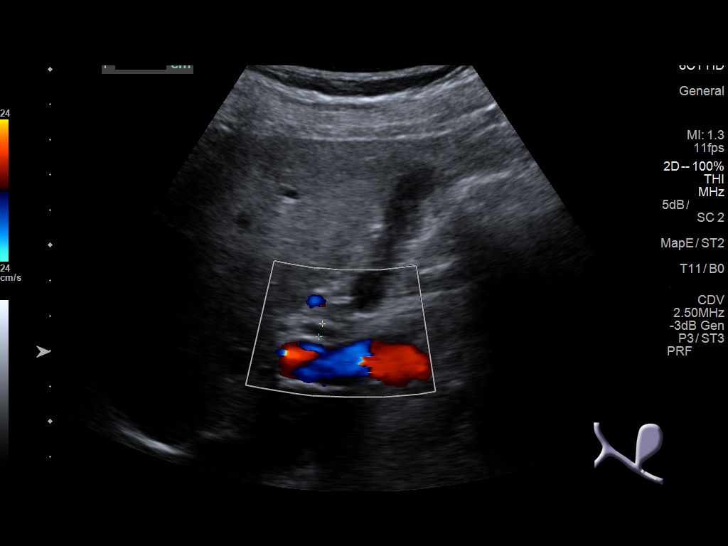
[im 26/76]
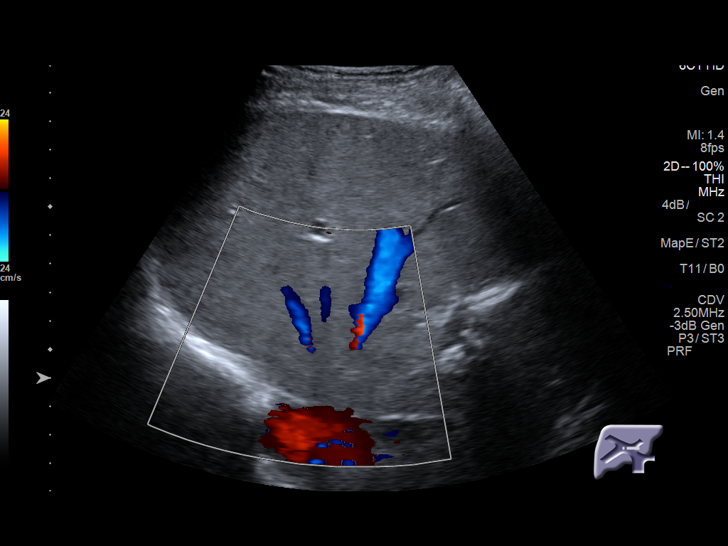
[im 32/76]
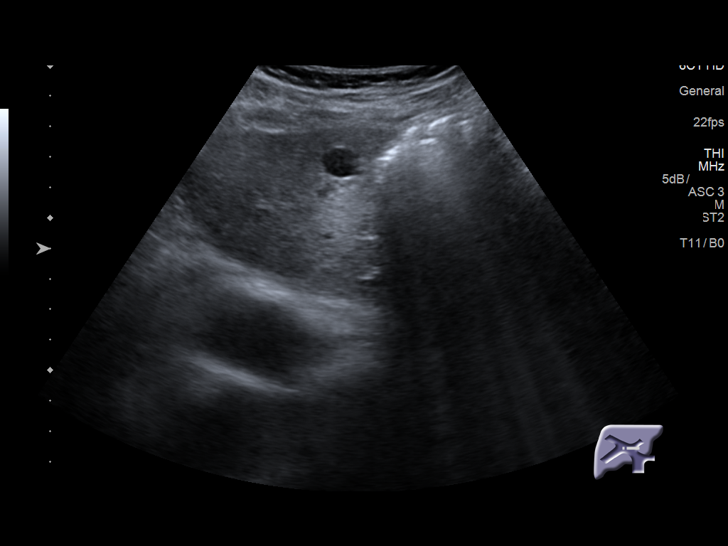
[im 38/76]
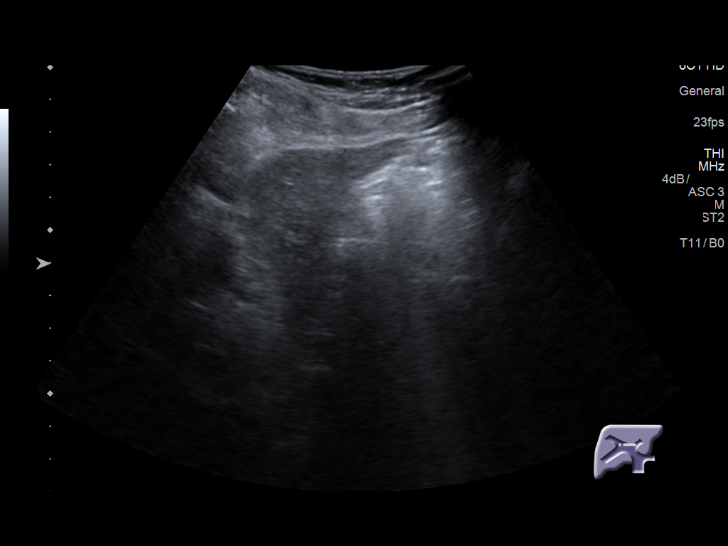
[im 44/76]
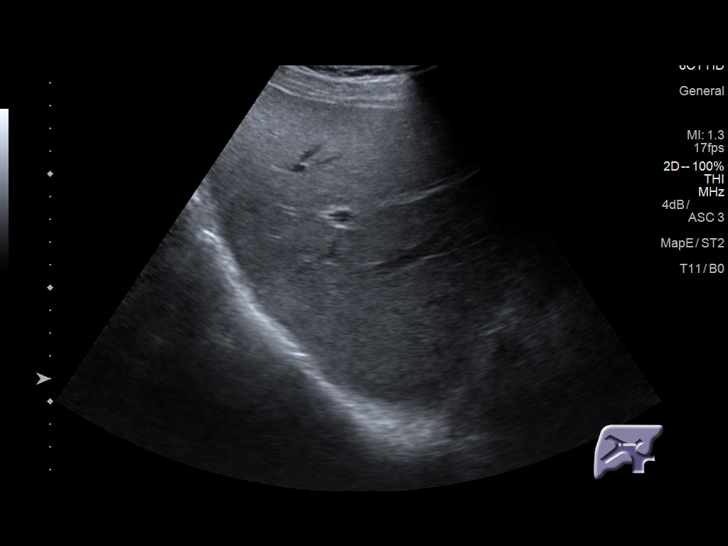
[im 51/76]
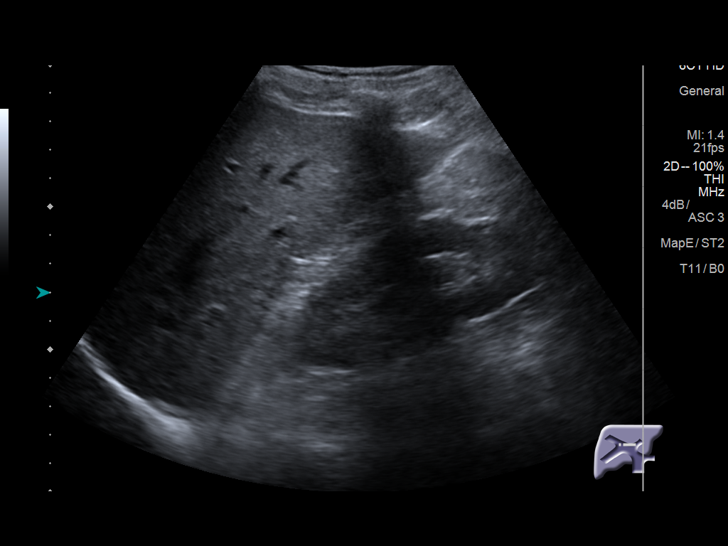
[im 57/76]
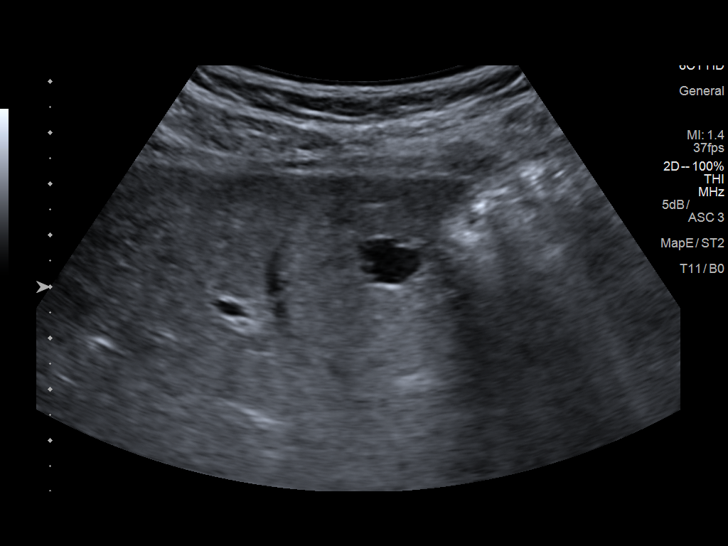
[im 63/76]
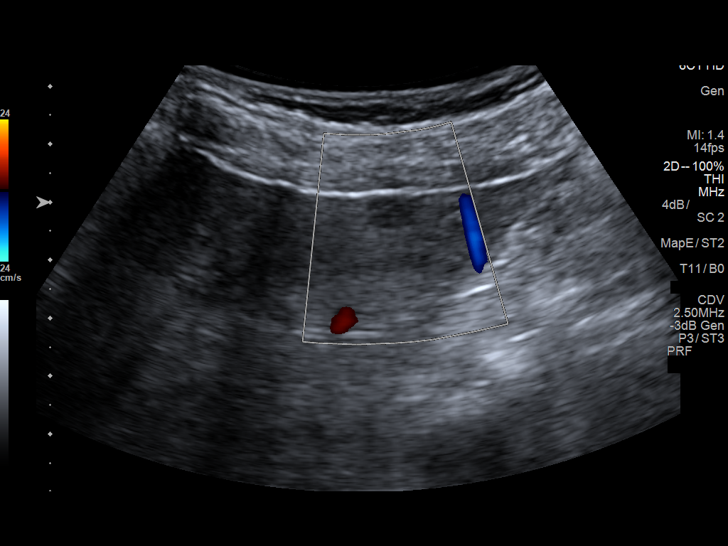
[im 69/76]
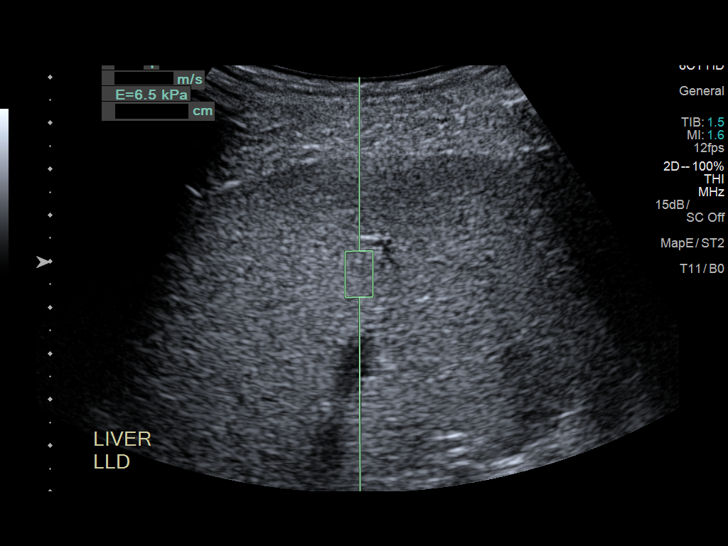
[im 76/76]
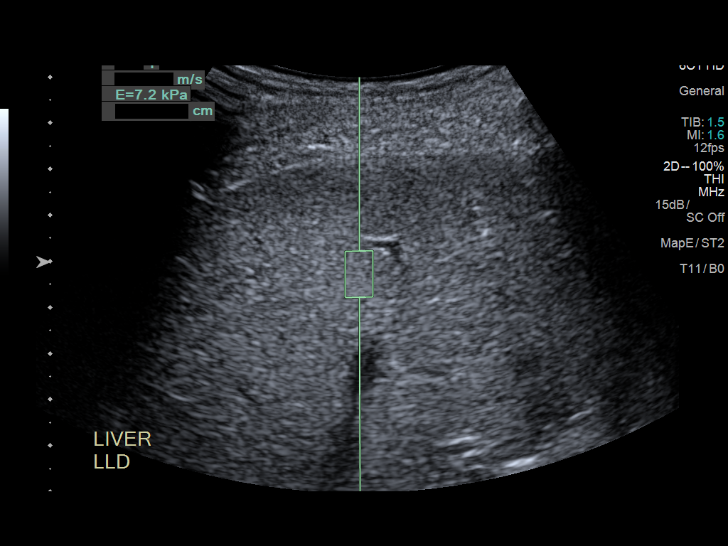

[13 of 25 positions shown; findings below may reference images not displayed]

FINDINGS: ULTRASOUND ABDOMEN LIMITED RIGHT UPPER QUADRANT

Gallbladder:

No gallstones or wall thickening visualized. No sonographic Murphy
sign noted.

Common bile duct:

Diameter: 4 mm, within normal limits.

Liver:

Two small hepatic cysts are seen in the right and left lobes
measuring 0.9 cm and 1.5 cm respectively. No solid liver masses
identified. Within normal limits in parenchymal echogenicity. Portal
vein is patent on color Doppler imaging with normal direction of
blood flow towards the liver.

ULTRASOUND HEPATIC ELASTOGRAPHY

Device: Siemens Helix VTQ

Patient position: Left Lateral Decubitus

Transducer [id number]

Number of measurements: 10

Hepatic segment:  8

Median velocity:   1.51 m/sec

IQR:

IQR/Median velocity ratio:

Corresponding Metavir fibrosis score:  F2 + some F3

Risk of fibrosis: Moderate

Limitations of exam: None

Please note that abnormal shear wave velocities may also be
identified in clinical settings other than with hepatic fibrosis,
such as: acute hepatitis, elevated right heart and central venous
pressures including use of beta blockers, Jhemboy disease
(Golamhosein), infiltrative processes such as
mastocytosis/amyloidosis/infiltrative tumor, extrahepatic
cholestasis, in the post-prandial state, and liver transplantation.
Correlation with patient history, laboratory data, and clinical
condition recommended.
IMPRESSION: ULTRASOUND ABDOMEN:

Small hepatic cysts, but no significant hepatobiliary abnormality
identified.

ULTRASOUND HEPATIC ELASTOGRAHY:

Median hepatic shear wave velocity is calculated at 1.51 m/sec.

Corresponding Metavir fibrosis score is F2 + some F3.

Risk of fibrosis is Moderate.

Follow-up: Additional testing appropriate

## 2019-03-11 ENCOUNTER — Emergency Department (HOSPITAL_COMMUNITY)
Admission: EM | Admit: 2019-03-11 | Discharge: 2019-03-11 | Disposition: A | Discharge status: Home / Self Care | Payer: Medicare Other | Attending: Emergency Medicine | Admitting: Emergency Medicine

## 2019-03-11 ENCOUNTER — Encounter (HOSPITAL_COMMUNITY): Payer: Self-pay | Admitting: Emergency Medicine

## 2019-03-11 DIAGNOSIS — K0889 Other specified disorders of teeth and supporting structures: Secondary | ICD-10-CM | POA: Insufficient documentation

## 2019-03-11 DIAGNOSIS — Z96652 Presence of left artificial knee joint: Secondary | ICD-10-CM | POA: Insufficient documentation

## 2019-03-11 DIAGNOSIS — Z79899 Other long term (current) drug therapy: Secondary | ICD-10-CM | POA: Insufficient documentation

## 2019-03-11 DIAGNOSIS — I10 Essential (primary) hypertension: Secondary | ICD-10-CM | POA: Insufficient documentation

## 2019-03-11 DIAGNOSIS — F17210 Nicotine dependence, cigarettes, uncomplicated: Secondary | ICD-10-CM | POA: Insufficient documentation

## 2019-03-11 MED ORDER — AMOXICILLIN-POT CLAVULANATE 875-125 MG PO TABS: 1.0000 | ORAL_TABLET | Freq: Two times a day (BID) | ORAL | 0 refills | Status: AC

## 2019-03-11 MED ORDER — NAPROXEN 375 MG PO TABS: 375.0000 mg | ORAL_TABLET | Freq: Two times a day (BID) | ORAL | 0 refills | Status: AC | PRN

## 2019-03-11 MED ORDER — CHLORHEXIDINE GLUCONATE 0.12 % MT SOLN: 15.0000 mL | Freq: Two times a day (BID) | OROMUCOSAL | 0 refills | Status: DC

## 2019-03-11 MED FILL — NAPROXEN 375 MG TABLET: 375 | 10 days supply | Qty: 20 | Fill #0

## 2019-03-11 MED FILL — LOSARTAN-HCTZ 100-25 MG TAB: 100-25 | 90 days supply | Qty: 90 | Fill #0

## 2019-03-11 MED FILL — MECLIZINE 25 MG TABLET: 25 | 30 days supply | Qty: 30 | Fill #0

## 2019-03-11 MED FILL — CHLORHEXIDINE 0.12% RINSE: 0.12 | 16 days supply | Qty: 473 | Fill #0

## 2019-03-11 MED FILL — AMOX-CLAV 875-125 MG TABLET: 875-125 | 7 days supply | Qty: 14 | Fill #0

## 2019-03-11 NOTE — Discharge Instructions (Addendum)
Today you were seen for dental pain.  If you start to experience and new or worsening symptoms return to the emergency department. If you start to experience fever, chills, neck stiffness/pain, or inability to move your neck or open your mouth come back to the emergency department immediately. If you begin to experience any blistering, rashes, swelling, or difficulty breathing seek medical care for evaluation of potentially more serious side effects.  Medications:  -I have prescribed you an antibiotic Augmentin to treat the infection and naproxen which is an anti-inflammatory medicine to treat the pain.  -Swish and spit 15 mL of chlorhexidine mouthwash for 30 seconds times daily.  You can also gargle warm salt water up to 5 times daily.  Both of these rinses can help to remove bacteria from the mouth.   You can apply a cool compress for 15 to 20 minutes, but avoid applying heat to the area. -Continue usual home medications.  2. Follow Up: Use the resource guide listed below to help you find a dentist if you do not already have one to follow up with. It is very important that you get evaluated by a dentist as soon as possible. Call tomorrow to schedule an appointment. Read the instructions below.     Be sure to eat something when taking the Naproxen as it can cause stomach upset and at worst stomach bleeding. Do not take additional non steroidal anti-inflammatory medicines such as Ibuprofen, Aleve, Advil, Mobic, Diclofenac, or goodie powder while taking Naproxen. You may supplement with Tylenol.    Dental Care: Organization         Address  Phone  Notes  [facility] Department of [facility] [phone] Accepts children up to age 80 who are enrolled in [place] or [place]; pregnant women with a Medicaid card; and children who have applied for Medicaid or [place] Health Choice, but were declined, whose parents can pay a reduced fee  at time of service.  [facility] Department of [facility]  [address], [place] [phone] Accepts children up to age 33 who are enrolled in [place] or [place]; pregnant women with a Medicaid card; and children who have applied for Medicaid or [place] Health Choice, but were declined, whose parents can pay a reduced fee at time of service.  [facility]  [place] [phone] Patients are seen by appointment only. Walk-ins are not accepted. [place] will see patients 83 years of age and older. Monday - Tuesday (8am-5pm) Most Wednesdays (8:30-5pm) $30 per visit, cash only  [facility] Adult Dental Access PROGRAM  [address], [facility] [phone] Patients are seen by appointment only. Walk-ins are not accepted. [place] will see patients 51 years of age and older. One Wednesday Evening (Monthly: Volunteer Based).  $30 per visit, cash only  [place]  [phone] for adults; Children under age 45, call Graduate Pediatric Dentistry at [phone]. Children aged 37-14, please call [phone] to request a pediatric application.  Dental services are provided in all areas of dental care including fillings, crowns and bridges, complete and partial dentures, implants, gum treatment, root canals, and extractions. Preventive care is also provided. Treatment is provided to both adults and children. Patients are selected via a lottery and there is often a waiting list.   [facility] [address], [place]  [phone] [email or web address]   [facility] [address] [place], [place] [phone], Ext. [phone] Second and Fourth Thursday of each month, opens at 6:30 AM; Clinic ends at 9 AM.  Patients are seen on a first-come first-served basis, and a limited number are seen during each clinic.   [facility]  [address] [place], [place] [phone]   Eligibility Requirements You must have lived in [place], [place], or [place] counties for at least the last three months.   You cannot be eligible for state or federal sponsored [employer], including [employer], [place], or [employer].   You generally cannot be eligible for healthcare insurance through your employer.    How to apply: Eligibility screenings are held every Tuesday and Wednesday afternoon from 1:00 pm until 4:00 pm. You do not need an appointment for the interview!  [facility] [address], [place], [place]   [place]  [place] Department  [place] Department  [phone]    RESOURCE GUIDE:  Dental Problems  Patients with Medicaid: [place] [phone] [address] Phone:  [phone]                                                  Phone:  [phone]  If unable to pay or uninsured, contact:  [facility] or [facility]. to become qualified for the adult dental clinic.  Insufficient Money for Medicine Contact United Way:  call "211" or [place] [phone].  No Primary Care Doctor Call [place] Other agencies that provide inexpensive medical care    [place]  [id number]    [facility]  [place]  [phone]    [facility]  [phone]    [facility]  [phone]    [facility]  [phone]

## 2019-03-11 NOTE — ED Provider Notes (Signed)
[facility] EMERGENCY DEPARTMENT Provider Note   CSN: [id number] Arrival date & time: 03/11/19  [id number]     History   Chief Complaint Chief Complaint  Patient presents with   Dental Injury    HPI Jeffrey [name] is a 68 y.o. male with past medical history significant for hypertension, chronic hepatitis presents to emergency department today with chief complaint of dental pain x 3 days.  Patient states approximately a year ago he was eating ribs while wearing his dentures and he cracked the top denture plate.  He states since then he has been unable to wear his dentures unfortunately.  He noticed 3 days ago he had a weird taste in his mouth and he had a loose tooth.  He has been unable to see the dentist because they do not accept his insurance.  He is unable to describe the weird taste in his mouth.  He just says it does not taste right.  He denies localized pain.  He rates it as 7 out of 10 in severity.  He describes it as switching from dull and aching to sharp.  Pain is worse when trying to eat.  He states the pain kept him awake last night.  He did not take anything for pain prior to arrival.  Denies fever, chills, voice change, inability to control secretions, nausea/vomiting, facial swelling, dysphagia, odynophagia, drainage or trauma   Past Medical History:  Diagnosis Date   Arthritis    Chronic hepatitis C ([place])    Hepatic cysts 12/01/2013   Seen on CT   Hypertension    Renal cysts 12/01/2013   Seen on CT    Patient Active Problem List   Diagnosis Date Noted   Tobacco use 10/12/2018   Acid reflux 07/25/2018   Rash and nonspecific skin eruption 03/06/2018   Liver fibrosis 02/13/2018   Tubular adenoma 12/26/2016   Left hip pain 11/26/2016   History of partial colectomy 05/18/2016   Erectile dysfunction 05/18/2016   Episodic recurrent vertigo 01/20/2016   Chronic hepatitis C without hepatic coma ([place]) 06/23/2015   Essential hypertension  04/12/2015   Arthritis of knee 04/12/2015   Alcohol abuse 04/12/2015    Past Surgical History:  Procedure Laterality Date   COLON SURGERY Right 09/2010   benign neoplasm of colon, s/p R colectomy   REPLACEMENT TOTAL KNEE Left 07/06/2014   Left knee replacement        Home Medications    Prior to Admission medications   Medication Sig Start Date End Date Taking? Authorizing Provider  amoxicillin-clavulanate (AUGMENTIN) 875-125 MG tablet Take 1 tablet by mouth every 12 (twelve) hours for 7 days. 03/11/19 03/18/19  Makail Watling E, PA-C  cetirizine (ZYRTEC) 10 MG tablet Take 1 tablet (10 mg total) by mouth daily. 03/04/18   Shirley, [place], DO  chlorhexidine (PERIDEX) 0.12 % solution Use as directed 15 mLs in the mouth or throat 2 (two) times daily. 03/11/19   Shaolin Armas E, PA-C  losartan-hydrochlorothiazide (HYZAAR) 100-25 MG tablet Take 1 tablet by mouth daily. 10/09/18   Benay Pike, MD  meclizine (ANTIVERT) 25 MG tablet Take 1 tablet (25 mg total) by mouth daily as needed. 10/09/18   Benay Pike, MD  naproxen (NAPROSYN) 375 MG tablet Take 1 tablet (375 mg total) by mouth 2 (two) times daily as needed for up to 14 days. 03/11/19 03/25/19  My Rinke E, PA-C  polyethylene glycol powder (GLYCOLAX/MIRALAX) powder Take 17 g by mouth daily. 04/12/15  Carlyle Dolly, MD    Family History Family History  Problem Relation Age of Onset   Cancer Maternal Aunt     Social History Social History   Tobacco Use   Smoking status: Current Every Day Smoker    Types: Cigarettes   Smokeless tobacco: Never Used  Substance Use Topics   Alcohol use: Yes    Alcohol/week: 42.0 standard drinks    Types: 42 Standard drinks or equivalent per week    Comment: 3-4 vodka drinks a day   Drug use: No     Allergies   Patient has no known allergies.   Review of Systems Review of Systems  Constitutional: Negative for chills and fever.  HENT: Positive for  dental problem. Negative for facial swelling, mouth sores, trouble swallowing and voice change.   Respiratory: Negative for cough and shortness of breath.   Cardiovascular: Negative for chest pain.  Gastrointestinal: Negative for abdominal pain, diarrhea and vomiting.  Skin: Negative for rash and wound.     Physical Exam Updated Vital Signs BP (!) 166/100 (BP Location: Left Arm)    Pulse 87    Temp 98.2 F (36.8 C) (Oral)    Resp 18    SpO2 100%   Physical Exam Vitals signs and nursing note reviewed.  Constitutional:      General: He is not in acute distress.    Appearance: He is not toxic-appearing.  HENT:     Head: Normocephalic and atraumatic.     Nose: Nose normal.     Mouth/Throat:     Comments: Multiple missing teeth and chronic tooth decay.  Tooth #7 is loose and very decayed. No noted area of swelling or fluctuance. No trismus. Mouth opening to at least 3 finger widths. Handles oral secretions without difficulty. External exam shows no asymmetry of the jaw line or face, no signs of obvious swelling or infection. No swelling or tenderness to the submental or submandibular regions. No swelling or tenderness into the soft tissues of the neck.Full active range of motion of the jaw. Neck is supple with full active range of motion, no tenderness to palpation of the soft tissues.  Eyes:     General: No scleral icterus.       Right eye: No discharge.        Left eye: No discharge.     Conjunctiva/sclera: Conjunctivae normal.  Neck:     Musculoskeletal: Normal range of motion. No muscular tenderness.  Cardiovascular:     Rate and Rhythm: Normal rate and regular rhythm.     Pulses: Normal pulses.     Heart sounds: Normal heart sounds.  Pulmonary:     Effort: Pulmonary effort is normal.     Breath sounds: Normal breath sounds.  Abdominal:     General: There is no distension.  Musculoskeletal: Normal range of motion.  Skin:    General: Skin is warm and dry.    Neurological:     Mental Status: He is oriented to person, place, and time.  Psychiatric:        Behavior: Behavior normal.      ED Treatments / Results  Labs (all labs ordered are listed, but only abnormal results are displayed) Labs Reviewed - No data to display  EKG None  Radiology No results found.  Procedures Procedures (including critical care time)  Medications Ordered in ED Medications - No data to display   Initial Impression / Assessment and Plan / ED Course  I have  reviewed the triage vital signs and the nursing notes.  Pertinent labs & imaging results that were available during my care of the patient were reviewed by me and considered in my medical decision making (see chart for details).  Pt is well appearing.  Chronic dental decay. Patient with loose tooth and tooth ache.  No gross abscess.  Exam unconcerning for Ludwig's angina or spread of infection.  Your initial evaluation I went to check on the patient and he shows me that he pulled out his tooth.  He has very minimal bleeding.  He is requesting to be discharged now that the tooth is out.  Will treat with Augmentin to cover for infection and anti-inflammatories medicine.  Urged patient to follow-up with dentist.  Resource list given.  Pt appears stable for d/c.  Strict return precautions discussed.   Portions of this note were generated with Lobbyist. Dictation errors may occur despite best attempts at proofreading.   Final Clinical Impressions(s) / ED Diagnoses   Final diagnoses:  Pain, dental    ED Discharge Orders         Ordered    amoxicillin-clavulanate (AUGMENTIN) 875-125 MG tablet  Every 12 hours     03/11/19 1248    chlorhexidine (PERIDEX) 0.12 % solution  2 times daily     03/11/19 1248    naproxen (NAPROSYN) 375 MG tablet  2 times daily PRN     03/11/19 1248           Cherre Robins, PA-C 03/11/19 1256    Tegeler, Gwenyth Allegra, MD 03/11/19 [phone]

## 2019-03-11 NOTE — ED Triage Notes (Signed)
Pt here for dental evaluation of right upper tooth. Tooth is loose and he reports pain. Denies swelling of mouth. Reports pain has kept him up all night. Reports broke his top dentures on ribs a few months ago and hasn't had any since. Is concerned he may develop a dental infection.

## 2019-03-13 ENCOUNTER — Ambulatory Visit: Payer: Self-pay

## 2019-03-13 NOTE — Patient Outreach (Addendum)
Patient is referred to Lazaro Arms, RN [facility] Care Coordinator Embedded Practice RN at [facility], the patient's Primary Care Office.

## 2019-03-14 NOTE — Chronic Care Management (AMB) (Addendum)
Care Management   Initial Visit Note  03/14/2019 Name: Jeffrey [name] MRN: [id number] DOB: 1951/02/05  Subjective:   Objective:  Assessment: Jeffrey [name] is a 68 y.o. year old male who sees Benay Pike, MD for primary care. The care management team was consulted for assistance with care management and care coordination needs related to Care Coordination.   Review of patient status, including review of consultants reports, relevant laboratory and other test results, and collaboration with appropriate care team members and the patient's provider was performed as part of comprehensive patient evaluation and provision of care management services.    SDOH (Social Determinants of Health) screening performed today: None. See Care Plan for related entries.    Outpatient Encounter Medications as of 03/13/2019  Medication Sig  . amoxicillin-clavulanate (AUGMENTIN) 875-125 MG tablet Take 1 tablet by mouth every 12 (twelve) hours for 7 days.  . chlorhexidine (PERIDEX) 0.12 % solution Use as directed 15 mLs in the mouth or throat 2 (two) times daily.  [name] losartan-hydrochlorothiazide (HYZAAR) 100-25 MG tablet Take 1 tablet by mouth daily.  . meclizine (ANTIVERT) 25 MG tablet Take 1 tablet (25 mg total) by mouth daily as needed.  . cetirizine (ZYRTEC) 10 MG tablet Take 1 tablet (10 mg total) by mouth daily. (Patient not taking: Reported on 03/13/2019)  . naproxen (NAPROSYN) 375 MG tablet Take 1 tablet (375 mg total) by mouth 2 (two) times daily as needed for up to 14 days. (Patient not taking: Reported on 03/13/2019)  . polyethylene glycol powder (GLYCOLAX/MIRALAX) powder Take 17 g by mouth daily. (Patient not taking: Reported on 03/13/2019)   No facility-administered encounter medications on file as of 03/13/2019.     Goals Addressed            This Visit's Progress   . "My Health is good I need help finding an eye doctor and dentist." (pt-stated)       Current Barriers:  [name] Knowledge  Deficits related to self navigate insurance system to find a dentist and an opthamologist.  Nurse Case Manager Clinical Goal(s):  [name] Over the next 30 days, patient will work with CM clinical social worker to navigate the system to find community resources to accomodates his needs for service  Interventions:  . Collaborated with social worker regarding issues that the patient is having regarding insurance Jeffrey [name] spoke on the phone with D. Merilyn Baba and I and was given information in [id number] with navigation of insurance [place]  [phone] to add dental and vision to his medicare while open enrollment   . Discussed plans with patient for ongoing care management follow up and provided patient with direct contact information for care management team  .  Patient Self Care Activities:  . Patient verbalizes understanding of plan to refer him to social worker for her to explore any options that may be available to him. . Self administers medications as prescribed . Performs ADL's independently . Performs IADL's independently . Calls provider office for new concerns or questions . Unable to independently self navigate the insurance system to find a dentist or an opthamologist  Initial goal documentation          Follow up plan:  The care management team will reach out to the patient again over the next 30 days.   Jeffrey [name] was given information about Care Management services today including:  1. Care Management services include personalized support from designated clinical staff supervised by a physician,  including individualized plan of care and coordination with other care providers 2. 24/7 contact phone numbers for assistance for urgent and routine care needs. 3. The patient may stop Care Management services at any time (effective at the end of the month) by phone call to the office staff.  Patient agreed to services and verbal consent obtained.  Jeffrey Arms RN, BSN, [facility] Care Management Coordinator [place] Phone: [phone] I Office: [phone] Fax: [phone]

## 2019-03-14 NOTE — Patient Instructions (Signed)
Visit Information  Goals Addressed            This Visit's Progress   . "My Health is good I need help finding an eye doctor and dentist." (pt-stated)       Current Barriers:  [name] Knowledge Deficits related to self navigate insurance system to find a dentist and an opthamologist.  Nurse Case Manager Clinical Goal(s):  [name] Over the next 30 days, patient will work with CM clinical social worker to navigate the system to find community resources to accomodates his needs for service  Interventions:  . Collaborated with social worker regarding issues that the patient is having regarding insurance Mr. Elfman spoke on the phone with D. Merilyn Baba and I and was given information in [id number] with navigation of insurance [place]  [phone] to add dental and vision to his medicare while open enrollment   . Discussed plans with patient for ongoing care management follow up and provided patient with direct contact information for care management team  .  Patient Self Care Activities:  . Patient verbalizes understanding of plan to refer him to social worker for her to explore any options that may be available to him. . Self administers medications as prescribed . Performs ADL's independently . Performs IADL's independently . Calls provider office for new concerns or questions . Unable to independently self navigate the insurance system to find a dentist or an opthamologist  Initial goal documentation        Mr. Kratovil was given information about Care Management services today including:  1. Care Management services include personalized support from designated clinical staff supervised by his physician, including individualized plan of care and coordination with other care providers 2. 24/7 contact phone numbers for assistance for urgent and routine care needs. 3. The patient may stop CCM services at any time (effective at the end of the month) by phone call to the  office staff.  Patient agreed to services and verbal consent obtained.   The patient verbalized understanding of instructions provided today and declined a print copy of patient instruction materials.   The care management team will reach out to the patient again over the next 30 days.  The patient has been provided with contact information for the care management team and has been advised to call with any health related questions or concerns.   Lazaro Arms RN, BSN, [facility] Care Management Coordinator [place] Phone: [phone] I Office: [phone] Fax: [phone]

## 2019-03-26 ENCOUNTER — Ambulatory Visit: Payer: Medicare Other | Admitting: Licensed Clinical Social Worker

## 2019-03-26 DIAGNOSIS — Z789 Other specified health status: Secondary | ICD-10-CM

## 2019-03-26 NOTE — Patient Instructions (Signed)
Licensed Clinical Social Worker Visit Information Mr. Knopp  it was nice speaking with you. Please call me directly if you have questions [phone] Goals we discussed today:  Goals Addressed            This Visit's Progress   . "My Health is good I need help finding an eye doctor and dentist." (pt-stated)   Not on track    Current Barriers:  [name] Knowledge Deficits related to self navigate insurance system to find a dentist and an opthamologist.  Nurse Case Manager Clinical Goal(s):  [name] Over the next 30 days, patient will work with CM clinical social worker to navigate the system to find community resources to accomodates his needs for service  Interventions:  . Collaborated with social worker regarding issues that the patient is having regarding insurance Mr. Wootan spoke on the phone with D. Merilyn Baba and I and was given information in [id number] with navigation of insurance [employer]  [phone] to add dental and vision to his medicare while open enrollment   . Discussed plans with patient for ongoing care management follow up and provided patient with direct contact information for care management team  .  Patient Self Care Activities:  . Patient verbalizes understanding of plan to refer him to social worker for her to explore any options that may be available to him. . Self administers medications as prescribed . Performs ADL's independently . Performs IADL's independently . Calls provider office for new concerns or questions . Unable to independently self navigate the insurance system to find a dentist or an opthamologist  Initial goal documentation     . Need to find coverage to pay for vision and dental (pt-stated)       Current Barriers:  . Chronic Disease Management support, education, and care coordination needs related to no coverage to managing dental and vision needs. Clinical Goal(s)  Over the next 10 days, patient will:  . Work with the  care management team to address educational, disease management, and care coordination needs  . Call care management team with questions or concerns . Call current health care provider to discuss options of added services Interventions  . Evaluation of current treatment plans and patient's adherence to plan . Assessed patient's education and care coordination needs . Provided education to patient about community options Patient Self Care Activities . Patient is unable to independently navigate resources to support managing chronic health conditions Initial goal documentation        Materials provided: Mr. Abdul was given information about Care Management services today including:  1. Care Management services include personalized support from designated clinical staff supervised by his physician, including individualized plan of care and coordination with other care providers 2. 24/7 contact [phone] for assistance for urgent and routine care needs. 3. Care Management services at any time by phone call to the office staff. The patient verbalized understanding of instructions provided today and declined a print copy of patient instruction materials.  Follow up plan:  SW will follow up with patient by phone over the next 2 days  Maurine Cane, LCSW

## 2019-03-26 NOTE — Chronic Care Management (AMB) (Signed)
  Social Work Care Management  Follow Up Note   03/26/2019 Name: Jeffrey [name] MRN: [id number] DOB: Apr 30, 1950  Referred by: Benay Pike, MD Reason for referral : Care Coordination (F/U for resources)  Jeffrey [name] is a 68 y.o. year old male who is a primary care patient of Benay Pike, MD. The care management team was consulted for assistance with care management and care coordination needs.    Review of patient status, including review of consultants reports, relevant laboratory and other test results, and collaboration with appropriate care team members and the patient's provider was performed as part of comprehensive patient evaluation and provision of chronic care management services.    SDOH (Social Determinants of Health) screening performed today: None identified. See Care Plan for related entries.  Goals Addressed            This Visit's Progress   . "My Health is good I need help finding an eye doctor and dentist." (pt-stated)   Not on track    Current Barriers:  [name] Knowledge Deficits related to self navigate insurance system to find a dentist and an opthamologist.  Nurse Case Manager Clinical Goal(s):  [name] Over the next 30 days, patient will work with CM clinical social worker to navigate the system to find community resources to accomodates his needs for service  Interventions:  . Collaborated with social worker regarding issues that the patient is having regarding insurance [name] spoke on the phone with D. Merilyn Baba and I and was given information in [id number] with navigation of insurance [place]  [phone] to add dental and vision to his medicare while open enrollment   . Discussed plans with patient for ongoing care management follow up and provided patient with direct contact information for care management team  .  Patient Self Care Activities:  . Patient verbalizes understanding of plan to refer him to social worker for her  to explore any options that may be available to him. . Self administers medications as prescribed . Performs ADL's independently . Performs IADL's independently . Calls provider office for new concerns or questions . Unable to independently self navigate the insurance system to find a dentist or an opthamologist  Initial goal documentation     . Need to find coverage to pay for vision and dental (pt-stated)       Current Barriers:  . Chronic Disease Management support, education, and care coordination needs related to no coverage to managing dental and vision needs. Clinical Goal(s)  Over the next 10 days, patient will:  . Work with the care management team to address educational, disease management, and care coordination needs  . Call care management team with questions or concerns . Call current health care provider to discuss options of added services Interventions  . Evaluation of current treatment plans and patient's adherence to plan . Assessed patient's education and care coordination needs . Provided education to patient about community options Patient Self Care Activities . Patient is unable to independently navigate resources to support managing chronic health conditions Initial goal documentation       Plan: The care management team will reach out to the patient again over the next 2 days.   Casimer Lanius, LCSW Clinical Social Worker [place] / [place]   [phone] 12:15 PM

## 2019-03-28 ENCOUNTER — Ambulatory Visit: Payer: Self-pay | Admitting: Licensed Clinical Social Worker

## 2019-03-28 ENCOUNTER — Ambulatory Visit: Payer: Medicare Other | Admitting: Licensed Clinical Social Worker

## 2019-03-28 DIAGNOSIS — I10 Essential (primary) hypertension: Secondary | ICD-10-CM

## 2019-03-28 DIAGNOSIS — Z789 Other specified health status: Secondary | ICD-10-CM

## 2019-03-28 NOTE — Patient Instructions (Signed)
Licensed Clinical Social Worker Visit Information Mr. Jeffrey [name]  it was nice speaking with you. Please call me directly if you have questions [phone] Goals we discussed today:  Goals Addressed            This Visit's Progress   . Need to find coverage to pay for vision and dental (pt-stated)   On track    Current Barriers:  . No medical coverage to manage unmet dental and vision needs which could negatively impact Chronic Disease Management, education, and care coordination needs. . Patient has spoken with and states has been approved for vision and dental with [facility] Medicare. Waiting for final paper work. Clinical Goal(s)  Over the next 10 days, patient will:  . Work with the care management team to address educational, disease management, and care coordination needs  . Call care management team with questions or concerns . Follow up with [facility] for expanded benefits  Interventions  . Evaluation of current treatment plans and patient's adherence to plan . Assessed patient's education and care coordination needs . Provided education to patient about community options Patient Self Care Activities . Patient is unable to independently navigate resources needed to manage chronic health conditions. Initial goal documentation        Materials provided:  Jeffrey [name] was given information about Care Management services today including:  1. Care Management services include personalized support from designated clinical staff supervised by his physician, including individualized plan of care and coordination with other care providers 2. 24/7 contact [phone] for assistance for urgent and routine care needs. 3. Care Management services at any time by phone call to the office staff. The patient verbalized understanding of instructions provided today and declined a print copy of patient instruction materials.  Follow up plan: Care management team will reach out to  patient in 30 days.  Maurine Cane, LCSW

## 2019-03-28 NOTE — Chronic Care Management (AMB) (Signed)
  Social Work Care Management  Follow Up Note   03/28/2019 Name: Jeffrey [name] MRN: [id number] DOB: July 11, 1950  Referred by: Benay Pike, MD Reason for referral : Care Coordination (vision and dental )  Jeffrey [name] is a 68 y.o. year old male who is a primary care patient of Benay Pike, MD.  Reason for follow-up: phone encounter with patient today for ongoing assessment and brief interventions to assist with ongoing assessment of care coordination needs .  SDOH (Social Determinants of Health) screening performed today: No new needs identified. See Care Plan for related entrie  Review of patient status, including review of consultants reports, relevant laboratory and other test results, and collaboration with appropriate care team members and the patient's provider was performed as part of comprehensive patient evaluation and provision of chronic care management services.   Goals Addressed            This Visit's Progress   . Need to find coverage to pay for vision and dental (pt-stated)   On track    Current Barriers:  . No medical coverage to manage unmet dental and vision needs which could negatively impact Chronic Disease Management, education, and care coordination needs. . Patient has spoken with and states has been approved for vision and dental with [employer] Medicare. Waiting for final paper work. Clinical Goal(s)  Over the next 10 days, patient will:  . Work with the care management team to address educational, disease management, and care coordination needs  . Call care management team with questions or concerns . Follow up with [facility] for expanded benefits  Interventions  . Evaluation of current treatment plans and patient's adherence to plan . Assessed patient's education and care coordination needs . Provided education to patient about community options Patient Self Care Activities . Patient is unable to independently navigate resources needed to  manage chronic health conditions. Initial goal documentation       Plan: 1. The care management team will reach out to the patient again over the next 30  days.  2. The patient has been provided with contact information for the care management team and has been advised to call with  questions or concerns.  3. Next PCP appointment scheduled for: Jan 15th 2021 4. No further follow up required: LCSW at this time  Casimer Lanius, [place] / [place]   [phone] 4:14 PM

## 2019-03-28 NOTE — Chronic Care Management (AMB) (Signed)
A user error has taken place: encounter opened in error, closed for administrative reasons.

## 2019-05-02 DIAGNOSIS — N486 Induration penis plastica: Secondary | ICD-10-CM | POA: Diagnosis not present

## 2019-05-02 DIAGNOSIS — N5201 Erectile dysfunction due to arterial insufficiency: Secondary | ICD-10-CM | POA: Diagnosis not present

## 2019-05-06 ENCOUNTER — Ambulatory Visit: Payer: Medicare PPO

## 2019-05-06 NOTE — Chronic Care Management (AMB) (Signed)
  Care Management   Outreach Note  05/06/2019 Name: Jeffrey [name] MRN: [id number] DOB: [birth year]  Referred by: Benay Pike, MD Reason for referral : Care Coordination (Care Management care coordination F/u)   An unsuccessful telephone outreach was attempted today. The patient was referred to the case management team by for assistance with care management and care coordination.   Follow Up Plan: A HIPPA compliant phone message was left for the patient providing contact information and requesting a return call.  The care management team will reach out to the patient again over the next 7 days.   Lazaro Arms RN, BSN, [facility] Care Management Coordinator [place] Phone: [phone] Fax: [phone]

## 2019-05-09 ENCOUNTER — Ambulatory Visit (INDEPENDENT_AMBULATORY_CARE_PROVIDER_SITE_OTHER): Payer: Medicare PPO | Admitting: Family Medicine

## 2019-05-09 ENCOUNTER — Other Ambulatory Visit: Payer: Self-pay

## 2019-05-09 ENCOUNTER — Encounter: Payer: Self-pay | Admitting: Family Medicine

## 2019-05-09 VITALS — BP 144/88 | HR 79 | Wt 165.0 lb

## 2019-05-09 DIAGNOSIS — N529 Male erectile dysfunction, unspecified: Secondary | ICD-10-CM

## 2019-05-09 DIAGNOSIS — R42 Dizziness and giddiness: Secondary | ICD-10-CM | POA: Diagnosis not present

## 2019-05-09 DIAGNOSIS — I10 Essential (primary) hypertension: Secondary | ICD-10-CM

## 2019-05-09 DIAGNOSIS — Z23 Encounter for immunization: Secondary | ICD-10-CM | POA: Diagnosis not present

## 2019-05-09 DIAGNOSIS — Z13220 Encounter for screening for lipoid disorders: Secondary | ICD-10-CM | POA: Diagnosis not present

## 2019-05-09 DIAGNOSIS — H8190 Unspecified disorder of vestibular function, unspecified ear: Secondary | ICD-10-CM

## 2019-05-09 MED ORDER — AMLODIPINE BESYLATE 10 MG PO TABS: 10.0000 mg | ORAL_TABLET | Freq: Every day | ORAL | 3 refills | Status: DC

## 2019-05-09 MED ORDER — LOSARTAN POTASSIUM 100 MG PO TABS: 100.0000 mg | ORAL_TABLET | Freq: Every day | ORAL | 3 refills | Status: DC

## 2019-05-09 MED ORDER — SILDENAFIL CITRATE 50 MG PO TABS: 50.0000 mg | ORAL_TABLET | Freq: Every day | ORAL | 2 refills | Status: DC | PRN

## 2019-05-09 MED FILL — AMLODIPINE BESYLATE 10 MG T: 10 | 90 days supply | Qty: 90 | Fill #0

## 2019-05-09 MED FILL — SILDENAFIL CITRATE 50 MG TA: 50 | 10 days supply | Qty: 10 | Fill #0

## 2019-05-09 MED FILL — LOSARTAN POTASSIUM 100 MG T: 100 | 90 days supply | Qty: 90 | Fill #0

## 2019-05-09 NOTE — Progress Notes (Signed)
   [facility] Phone: [phone]     Jeffrey [name] - 69 y.o. male MRN [id number]  Date of birth: Nov 07, 1950  Subjective:   cc: dizziness, erectile dysfunction  HPI:  Dizziness: episodes of dizziness when walking.  He feels like he's going to fall into a wall.  Has not fallen or had syncopal episode.  Doesn't feel like room is moving. No issues when standing or laying down. If driving around a curve he feels like the curve is never ending. Takes meclizine maybe twice a week.  No n/v. No HA, hearing loss, tinnitus, or blurred vision.    ED:  Having trouble getting erections and keeping them.   Erections Not as strong either.occurs  Every time he has sex.  Going on for 1 month. Still has morning erections.  Used to have them all the time, now they are less frequent.      HTN: Patient pliant with his medication.  Takes it once a day.  No issues with cough or angioedema.  ROS: See HPI for pertinent positives and negatives  Family history reviewed for today's visit. No changes.  Social history- patient is a current smoker  2 Objective:   BP (!) 144/88   Pulse 79   Wt 165 lb (74.8 kg)   SpO2 100%   BMI 23.68 kg/m  Gen: Alert and oriented no acute distress HEENT: PERRLA.  NCAT CV: Regular rate and rhythm, no murmurs.  Radial pulse 2+ bilaterally. Resp: Lungs clear to auscultation bilaterally.  No wheezes or crackles.  No respiratory distress. Msk: No lower extremity edema.  Normal gait. Neuro: Cranial nerves II through XII  grossly intact.  Negative Romberg  Assessment/Plan:   Essential hypertension BP mildly elevated.  Currently taking Hyzaar 100-25.  Given the patient's complaints of erectile dysfunction, I have changed his blood pressure medications to those with less risk of ED. -Stop Hyzaar -Start losartan 20 mg nightly -Start amlodipine 10 mg nightly  Episodic recurrent vertigo Mild.  Consistent since last visit.  Likely multifactorial from age  and alcohol use. -Refer to vestibular rehab  Erectile dysfunction Patient states this is been occurring for the last month, but has a diagnosis 2018. -Change blood pressure medications to those with less side effects regarding erectile dysfunction -Started on sildenafil 50 mg, advised patient to cut tablet in half. -Lipid panel ordered     Clemetine Marker, MD PGY-2 [facility]

## 2019-05-09 NOTE — Patient Instructions (Signed)
It was nice to see you again today,  For your blood pressure, we have changed some things I have outlined below: -Stop taking your current blood pressure medication, Hyzaar. -Start taking losartan every night -Start taking amlodipine every night -If you notice swelling in your legs let us know and stop the amlodipine.  For your erectile dysfunction, we have prescribed you Viagra.  You can start off by cutting the Viagra pill in half with a pill cutter and seeing if that dose is high enough to work. -We have also stopped your HCTZ medication which can sometimes affect erectile dysfunction  For your dizziness, we have sent you a referral to vestibular rehab.  This is a type of physical therapy that helps with your balance.  Someone will call you to schedule an appointment.  Please come back in 3 months.  Have a great day  Clemetine Marker, MD

## 2019-05-10 LAB — LIPID PANEL
Chol/HDL Ratio: 1.3 ratio (ref 0.0–5.0)
Cholesterol, Total: 180 mg/dL (ref 100–199)
HDL: 144 mg/dL (ref >39)
LDL Chol Calc (NIH): 25 mg/dL (ref 0–99)
Triglycerides: 53 mg/dL (ref 0–149)
VLDL Cholesterol Cal: 11 mg/dL (ref 5–40)

## 2019-05-11 NOTE — Assessment & Plan Note (Signed)
Patient states this is been occurring for the last month, but has a diagnosis 2018. -Change blood pressure medications to those with less side effects regarding erectile dysfunction -Started on sildenafil 50 mg, advised patient to cut tablet in half. -Lipid panel ordered

## 2019-05-11 NOTE — Assessment & Plan Note (Signed)
BP mildly elevated.  Currently taking Hyzaar 100-25.  Given the patient's complaints of erectile dysfunction, I have changed his blood pressure medications to those with less risk of ED. -Stop Hyzaar -Start losartan 20 mg nightly -Start amlodipine 10 mg nightly

## 2019-05-11 NOTE — Assessment & Plan Note (Signed)
Mild.  Consistent since last visit.  Likely multifactorial from age and alcohol use. -Refer to vestibular rehab

## 2019-05-13 ENCOUNTER — Encounter: Payer: Self-pay | Admitting: Family Medicine

## 2019-05-16 ENCOUNTER — Telehealth: Payer: Self-pay | Admitting: *Deleted

## 2019-05-16 ENCOUNTER — Other Ambulatory Visit: Payer: Self-pay | Admitting: Family Medicine

## 2019-05-16 ENCOUNTER — Telehealth: Payer: Self-pay | Admitting: Family Medicine

## 2019-05-16 DIAGNOSIS — E7889 Other lipoprotein metabolism disorders: Secondary | ICD-10-CM

## 2019-05-16 MED ORDER — AMLODIPINE BESYLATE 10 MG PO TABS: 10.0000 mg | ORAL_TABLET | Freq: Every day | ORAL | 3 refills | Status: DC

## 2019-05-16 MED ORDER — MECLIZINE HCL 25 MG PO TABS: 25.0000 mg | ORAL_TABLET | Freq: Every day | ORAL | 3 refills | Status: DC | PRN

## 2019-05-16 MED ORDER — LOSARTAN POTASSIUM 100 MG PO TABS: 100.0000 mg | ORAL_TABLET | Freq: Every day | ORAL | 3 refills | Status: DC

## 2019-05-16 MED ORDER — SILDENAFIL CITRATE 50 MG PO TABS: 50.0000 mg | ORAL_TABLET | Freq: Every day | ORAL | 3 refills | Status: DC | PRN

## 2019-05-16 NOTE — Telephone Encounter (Signed)
Called pt to discuss his elevated HDL and low LDL with him. Informed him that from what I could research, there wasn't any negative effects of an HDL as high as his, but it was so unusually high (144) that I would like him to see a lipid specialist at least once.  He agreed.  Referral was placed to Dr. Debara Pickett.    Pt then stated his blood pressure was very high since he stopped taking his BP medication (we are currently switching him to a different medication, but he has stopped taking the old medication before starting his new one).  He stated he had a headache this morning and took the medication.  He then took his blood pressure while on the phone and stated it was 191/108.  I asked him about symptoms and he said the headache was resolved.  He denied blurry vision, chest pain, abdominal pain.  I told him he should continue taking his old  medication until he gets the new one and that if any of the symptoms we discussed occur, including headaches, he should go to the emergency department.

## 2019-05-17 NOTE — Telephone Encounter (Signed)
Sending scripts to [facility] in pharmacy to reduce cost to pt.

## 2019-05-27 ENCOUNTER — Emergency Department (HOSPITAL_COMMUNITY)
Admission: EM | Admit: 2019-05-27 | Discharge: 2019-05-27 | Disposition: A | Discharge status: Home / Self Care | Payer: Medicare PPO | Attending: Emergency Medicine | Admitting: Emergency Medicine

## 2019-05-27 ENCOUNTER — Encounter (HOSPITAL_COMMUNITY): Payer: Self-pay | Admitting: Emergency Medicine

## 2019-05-27 ENCOUNTER — Other Ambulatory Visit: Payer: Self-pay

## 2019-05-27 DIAGNOSIS — Z79899 Other long term (current) drug therapy: Secondary | ICD-10-CM | POA: Insufficient documentation

## 2019-05-27 DIAGNOSIS — R519 Headache, unspecified: Secondary | ICD-10-CM | POA: Diagnosis not present

## 2019-05-27 DIAGNOSIS — Z833 Family history of diabetes mellitus: Secondary | ICD-10-CM | POA: Diagnosis not present

## 2019-05-27 DIAGNOSIS — Z6822 Body mass index (BMI) 22.0-22.9, adult: Secondary | ICD-10-CM | POA: Diagnosis not present

## 2019-05-27 DIAGNOSIS — Z86010 Personal history of colonic polyps: Secondary | ICD-10-CM | POA: Diagnosis not present

## 2019-05-27 DIAGNOSIS — H109 Unspecified conjunctivitis: Secondary | ICD-10-CM | POA: Diagnosis not present

## 2019-05-27 DIAGNOSIS — Z823 Family history of stroke: Secondary | ICD-10-CM | POA: Diagnosis not present

## 2019-05-27 DIAGNOSIS — F17210 Nicotine dependence, cigarettes, uncomplicated: Secondary | ICD-10-CM | POA: Insufficient documentation

## 2019-05-27 DIAGNOSIS — I10 Essential (primary) hypertension: Secondary | ICD-10-CM | POA: Diagnosis not present

## 2019-05-27 DIAGNOSIS — H5789 Other specified disorders of eye and adnexa: Secondary | ICD-10-CM | POA: Diagnosis not present

## 2019-05-27 DIAGNOSIS — Z809 Family history of malignant neoplasm, unspecified: Secondary | ICD-10-CM | POA: Diagnosis not present

## 2019-05-27 MED ORDER — AMLODIPINE BESYLATE 5 MG PO TABS
10.0000 mg | ORAL_TABLET | Freq: Once | ORAL | Status: AC
  Administered 2019-05-27: 10:00:00 10 mg via ORAL
  Filled 2019-05-27: qty 2

## 2019-05-27 MED ORDER — ERYTHROMYCIN 5 MG/GM OP OINT: TOPICAL_OINTMENT | OPHTHALMIC | 0 refills | Status: DC

## 2019-05-27 MED ORDER — FLUORESCEIN SODIUM 1 MG OP STRP
1.0000 | ORAL_STRIP | Freq: Once | OPHTHALMIC | Status: AC
  Administered 2019-05-27: 10:00:00 1 via OPHTHALMIC
  Filled 2019-05-27: qty 1

## 2019-05-27 MED ORDER — TETRACAINE HCL 0.5 % OP SOLN
2.0000 [drp] | Freq: Once | OPHTHALMIC | Status: AC
  Administered 2019-05-27: 10:00:00 2 [drp] via OPHTHALMIC
  Filled 2019-05-27: qty 4

## 2019-05-27 MED FILL — AMLODIPINE BESYLATE 10 MG T: 10 | 90 days supply | Qty: 90 | Fill #0

## 2019-05-27 MED FILL — LOSARTAN POTASSIUM 100 MG T: 100 | 90 days supply | Qty: 90 | Fill #0

## 2019-05-27 MED FILL — SILDENAFIL CITRATE 50 MG TA: 50 | 10 days supply | Qty: 10 | Fill #0

## 2019-05-27 NOTE — ED Provider Notes (Signed)
[facility] EMERGENCY DEPARTMENT Provider Note   CSN: [id number] Arrival date & time: 05/27/19  [id number]     History Chief Complaint  Patient presents with  . Hypertension    Jeffrey [name] is a 69 y.o. male with PMH significant for HTN who presents to the ED with complaints of elevated blood pressure.  Patient reports that for the past 3 days he had not been taking his medication because it makes his "dick not work".  He also endorses headache during that span as well as a 1 day history of right eye redness and discomfort.  He first noticed his right eye discomfort last night and this morning it was red with continued photophobia and irritation.  He denies foreign body sensation.  He denies any fevers or chills, dizziness, impaired vision, chest pain or difficulty breathing, back or abdominal discomfort, hematuria, numbness or weakness, or other neurologic symptoms.  He reports he did take his Cozaar this morning however he did not refill his amlodipine as it was too expensive.   HPI     Past Medical History:  Diagnosis Date  . Arthritis   . Chronic hepatitis C ([place])   . Hepatic cysts 12/01/2013   Seen on CT  . Hypertension   . Renal cysts 12/01/2013   Seen on CT    Patient Active Problem List   Diagnosis Date Noted  . Tobacco use 10/12/2018  . Acid reflux 07/25/2018  . Rash and nonspecific skin eruption 03/06/2018  . Liver fibrosis 02/13/2018  . Tubular adenoma 12/26/2016  . Left hip pain 11/26/2016  . History of partial colectomy 05/18/2016  . Erectile dysfunction 05/18/2016  . Episodic recurrent vertigo 01/20/2016  . Chronic hepatitis C without hepatic coma ([place]) 06/23/2015  . Essential hypertension 04/12/2015  . Arthritis of knee 04/12/2015  . Alcohol abuse 04/12/2015    Past Surgical History:  Procedure Laterality Date  . COLON SURGERY Right 09/2010   benign neoplasm of colon, s/p R colectomy  . REPLACEMENT TOTAL KNEE Left 07/06/2014   Left knee  replacement       Family History  Problem Relation Age of Onset  . Cancer Maternal Aunt     Social History   Tobacco Use  . Smoking status: Current Every Day Smoker    Types: Cigarettes  . Smokeless tobacco: Never Used  Substance Use Topics  . Alcohol use: Yes    Alcohol/week: 42.0 standard drinks    Types: 42 Standard drinks or equivalent per week    Comment: 3-4 vodka drinks a day  . Drug use: No    Home Medications Prior to Admission medications   Medication Sig Start Date End Date Taking? Authorizing Provider  amLODipine (NORVASC) 10 MG tablet Take 1 tablet (10 mg total) by mouth at bedtime. 05/16/19   Benay Pike, MD  cetirizine (ZYRTEC) 10 MG tablet Take 1 tablet (10 mg total) by mouth daily. Patient not taking: Reported on 03/13/2019 03/04/18   Shirley, [place], DO  chlorhexidine (PERIDEX) 0.12 % solution Use as directed 15 mLs in the mouth or throat 2 (two) times daily. 03/11/19   Albrizze, Kaitlyn E, PA-C  erythromycin ophthalmic ointment Place a 1/2 inch ribbon of ointment into the lower eyelid. 05/27/19   Corena Herter, PA-C  losartan (COZAAR) 100 MG tablet Take 1 tablet (100 mg total) by mouth at bedtime. 05/16/19   Benay Pike, MD  meclizine (ANTIVERT) 25 MG tablet Take 1 tablet (25 mg total) by mouth  daily as needed. 05/16/19   Benay Pike, MD  polyethylene glycol powder (GLYCOLAX/MIRALAX) powder Take 17 g by mouth daily. Patient not taking: Reported on 03/13/2019 04/12/15   Carlyle Dolly, MD  sildenafil (VIAGRA) 50 MG tablet Take 1 tablet (50 mg total) by mouth daily as needed for erectile dysfunction. 05/16/19   Benay Pike, MD    Allergies    Patient has no known allergies.  Review of Systems   Review of Systems  Constitutional: Negative for chills and fever.  Eyes: Positive for photophobia, pain and redness. Negative for discharge and itching.  Respiratory: Negative for shortness of breath.   Cardiovascular: Negative for chest pain.   Gastrointestinal: Negative for abdominal pain.  Musculoskeletal: Negative for back pain.  Neurological: Positive for headaches. Negative for dizziness.    Physical Exam Updated Vital Signs BP (!) 152/100   Pulse 82   Temp 98 F (36.7 C) (Oral)   Resp 20   SpO2 98%   Physical Exam Vitals and nursing note reviewed. Exam conducted with a chaperone present.  Constitutional:      Appearance: Normal appearance.  HENT:     Head: Normocephalic and atraumatic.  Eyes:     Comments: Left eye:  Normal. Right eye:  Injected conjunctiva.  No purulent drainage.  Photophobic.  Wood's lamp exam reveals mild uptake on nasal aspect.  No ulceration.  No pain with EOMs.  No consensual photophobia.  EOMs fully intact.  No surrounding erythema.  Intraocular pressure < 15.    Pulmonary:     Effort: Pulmonary effort is normal.  Skin:    General: Skin is dry.  Neurological:     Mental Status: He is alert.     GCS: GCS eye subscore is 4. GCS verbal subscore is 5. GCS motor subscore is 6.  Psychiatric:        Mood and Affect: Mood normal.        Behavior: Behavior normal.        Thought Content: Thought content normal.     ED Results / Procedures / Treatments   Labs (all labs ordered are listed, but only abnormal results are displayed) Labs Reviewed - No data to display  EKG None  Radiology No results found.  Procedures Procedures (including critical care time)  Medications Ordered in ED Medications  fluorescein ophthalmic strip 1 strip (1 strip Right Eye Given by Other 05/27/19 1002)  tetracaine (PONTOCAINE) 0.5 % ophthalmic solution 2 drop (2 drops Right Eye Given by Other 05/27/19 1001)  amLODipine (NORVASC) tablet 10 mg (10 mg Oral Given 05/27/19 1002)    ED Course  I have reviewed the triage vital signs and the nursing notes.  Pertinent labs & imaging results that were available during my care of the patient were reviewed by me and considered in my medical decision making (see chart  for details).    MDM Rules/Calculators/A&P                      Patient reports that he has a follow-up appointment with his primary care provider, Dr. Jeannine Kitten, on 05/30/2019.  Is amlodipine 10 mg dose was provided here in the ED.  We will not obtain BMP to assess for renal function given low suspicion for hypertensive urgency.  Do not suspect endorgan damage.  He only endorses headache and his right eye symptoms which are concerning for conjunctivitis.  He endorses itchiness, photophobia, and discomfort.  Will treat with erythromycin  ointment and refer to ophthalmology for ongoing evaluation management.  Patient feels improved by end of stay in the ED and voices desire for discharge.  He is not in any acute distress and his vital signs are reassuring.  Woods lamp examination and Tono-Pen performed.  Intraocular pressure less than 15.  Was lamp revealed no foreign bodies.  No surrounding erythema.  No pain with EOMs.  Low suspicion for iritis, globe injury, acute angle glaucoma, orbital cellulitis, or any other acute ophthalmologic emergencies.  Patient's blood pressure is only mildly elevated at 152/100.  He is hemodynamically stable in no acute distress.  He is safe for discharge and has appropriate follow-up with his primary care provider as well as a referral to ophthalmology. .  Final Clinical Impression(s) / ED Diagnoses Final diagnoses:  Essential hypertension  Conjunctivitis of right eye, unspecified conjunctivitis type    Rx / DC Orders ED Discharge Orders         Ordered    erythromycin ophthalmic ointment     05/27/19 1031           Corena Herter, PA-C 05/27/19 1031    Davonna Belling, MD 05/27/19 [phone]

## 2019-05-27 NOTE — ED Notes (Signed)
Pt dc'd home w/all belongings, a/o x4, ambulatory on dc

## 2019-05-27 NOTE — Discharge Instructions (Addendum)
Please follow-up with your primary care provider, Dr. Jeannine Kitten, at your scheduled visit on 05/30/2019.  I have also referred you to ophthalmology for ongoing evaluation and management of your right eye symptoms should your pain and discomfort continue to persist.  Please apply the erythromycin antibiotic ointment, as directed.  Please return to the ED or seek immediate medical attention for any new or worsening symptoms.

## 2019-05-27 NOTE — ED Triage Notes (Signed)
Pt came running down elevators yelling he needed help for his high blood pressure. Stated it was 160/100.  Pt then fell over as we were applying armband. No injuries noted. Pt alert, oriented x4, with strong pulse. Taken back wheelchair to ready room.

## 2019-05-28 ENCOUNTER — Encounter: Payer: Self-pay | Admitting: Internal Medicine

## 2019-05-28 ENCOUNTER — Ambulatory Visit (INDEPENDENT_AMBULATORY_CARE_PROVIDER_SITE_OTHER): Payer: Medicare PPO | Admitting: Internal Medicine

## 2019-05-28 VITALS — BP 136/82 | HR 94 | Temp 97.9°F | Ht 70.0 in | Wt 164.0 lb

## 2019-05-28 DIAGNOSIS — R7989 Other specified abnormal findings of blood chemistry: Secondary | ICD-10-CM | POA: Diagnosis not present

## 2019-05-28 NOTE — Patient Instructions (Signed)
Medication Instructions:  Your physician recommends that you continue on your current medications as directed. Please refer to the Current Medication list given to you today.  *If you need a refill on your cardiac medications before your next appointment, please call your pharmacy*   Follow-Up: At [facility], you and your health needs are our priority.  As part of our continuing mission to provide you with exceptional heart care, we have created designated Provider Care Teams.  These Care Teams include your primary Cardiologist (physician) and Advanced Practice Providers (APPs -  Physician Assistants and Nurse Practitioners) who all work together to provide you with the care you need, when you need it.  Your next appointment:  AS NEEDED

## 2019-05-28 NOTE — Progress Notes (Signed)
LIPID CLINIC CONSULT NOTE  Chief Complaint:  Manage dyslipidemia  Primary Care Physician: Jeffrey Pike, MD  Primary Cardiologist:  No primary care provider on file.  HPI:  Jeffrey [name] is a 69 y.o. male who is being seen today for the evaluation of dyslipidemia at the request of Jeffrey Naegeli, MD. This is a pleasant 69 year old male who is currently referred for evaluation and management of dyslipidemia.  Interestingly a recent lipid profile was performed which showed total cholesterol 180, triglycerides 53, HDL 144 and LDL 25.  Jeffrey [name] has no cardiac history and is asymptomatic from a cardiac standpoint however was referred for evaluation of his dyslipidemia, primarily high HDL and low LDL cholesterol, not on therapy.  Other medical problems include a history of hypertension, chronic hepatitis C and significant alcohol use, in fact he drinks at least 42 drinks a week.  He reports for a long time he had abstained from alcohol because of his family history of alcoholism however after his mother died he started drinking and since then continues to be around people that he drinks with.  He reports that has caused some difficulty in his life.  PMHx:  Past Medical History:  Diagnosis Date  . Arthritis   . Chronic hepatitis C ([place])   . Hepatic cysts 12/01/2013   Seen on CT  . Hypertension   . Renal cysts 12/01/2013   Seen on CT    Past Surgical History:  Procedure Laterality Date  . COLON SURGERY Right 09/2010   benign neoplasm of colon, s/p R colectomy  . REPLACEMENT TOTAL KNEE Left 07/06/2014   Left knee replacement    FAMHx:  Family History  Problem Relation Age of Onset  . Cancer Maternal Aunt     SOCHx:   reports that he has been smoking cigarettes. He has never used smokeless tobacco. He reports current alcohol use of about 42.0 standard drinks of alcohol per week. He reports that he does not use drugs.  ALLERGIES:  No Known Allergies  ROS: Pertinent items  noted in HPI and remainder of comprehensive ROS otherwise negative.  HOME MEDS: Current Outpatient Medications on File Prior to Visit  Medication Sig Dispense Refill  . amLODipine (NORVASC) 10 MG tablet Take 1 tablet (10 mg total) by mouth at bedtime. 90 tablet 3  . erythromycin ophthalmic ointment Place a 1/2 inch ribbon of ointment into the lower eyelid. 1 g 0  . losartan (COZAAR) 100 MG tablet Take 1 tablet (100 mg total) by mouth at bedtime. 90 tablet 3  . meclizine (ANTIVERT) 25 MG tablet Take 1 tablet (25 mg total) by mouth daily as needed. 30 tablet 3  . polyethylene glycol powder (GLYCOLAX/MIRALAX) powder Take 17 g by mouth daily. 3350 g 5  . sildenafil (VIAGRA) 50 MG tablet Take 1 tablet (50 mg total) by mouth daily as needed for erectile dysfunction. 30 tablet 3  . cetirizine (ZYRTEC) 10 MG tablet Take 1 tablet (10 mg total) by mouth daily. (Patient not taking: Reported on 03/13/2019) 30 tablet 11  . chlorhexidine (PERIDEX) 0.12 % solution Use as directed 15 mLs in the mouth or throat 2 (two) times daily. (Patient not taking: Reported on 05/28/2019) 120 mL 0   No current facility-administered medications on file prior to visit.    LABS/IMAGING: No results found for this or any previous visit (from the past 48 hour(s)). No results found.  LIPID PANEL:    Component Value Date/Time   CHOL 180 05/09/2019  1216   TRIG 53 05/09/2019 1216   HDL 144 05/09/2019 1216   CHOLHDL 1.3 05/09/2019 1216   CHOLHDL 1.5 04/08/2015 1543   VLDL 16 04/08/2015 1543   LDLCALC 25 05/09/2019 1216    WEIGHTS: Wt Readings from Last 3 Encounters:  05/28/19 164 lb (74.4 kg)  05/09/19 165 lb (74.8 kg)  10/09/18 164 lb 3.2 oz (74.5 kg)    VITALS: BP 136/82   Pulse 94   Temp 97.9 F (36.6 C)   Ht 5\' 10"  (1.778 m)   Wt 164 lb (74.4 kg)   SpO2 99%   BMI 23.53 kg/m   EXAM: General appearance: alert and no distress Neck: no carotid bruit, no JVD and thyroid not enlarged, symmetric, no  tenderness/mass/nodules Lungs: clear to auscultation bilaterally Heart: regular rate and rhythm, S1, S2 normal, no murmur, click, rub or gallop Abdomen: soft, non-tender; bowel sounds normal; no masses,  no organomegaly Extremities: extremities normal, atraumatic, no cyanosis or edema Pulses: 2+ and symmetric Skin: Skin color, texture, turgor normal. No rashes or lesions Neurologic: Grossly normal Psych: Pleasant  EKG: Deferred  ASSESSMENT: 1. Dyslipidemia with high HDL and low LDL cholesterol 2. Moderate to heavy alcohol abuse  PLAN: 1.   Jeffrey [name] has a dyslipidemia with very high HDL and low LDL cholesterol.  He reports a number of years ago his cholesterol was tested prior to his alcohol use and he said his numbers were "normal".  I suspect the significant increase in his HDL cholesterol and suppression of LDL cholesterol is due to longstanding alcohol abuse, effects on the liver and the fact that alcohol accelerates the transport rate of ApoA1 in ApoA2 on HDL cholesterol leading to rapid turnover of HDL.  There is much debate as to whether this could be cardioprotective, or not however generally I believe that the deleterious effects of alcohol abuse outweigh any potential cardioprotective benefit of high HDL.  That being said, I do not feel like he is at high risk for heart disease.  No further therapy or testing is recommended.  Thanks for the interesting referral.  Jeffrey Casino, MD, FACC, Jeffrey [name] Director of the [facility] Diplomate of the American Board of Clinical Lipidology Attending Cardiologist  Direct Dial: [phone]  Fax: [phone]  Website:  [email or web address]  Jeffrey [name] Jeffrey [name] 05/28/2019, 3:21 PM

## 2019-06-09 ENCOUNTER — Other Ambulatory Visit: Payer: Self-pay

## 2019-06-09 ENCOUNTER — Ambulatory Visit (INDEPENDENT_AMBULATORY_CARE_PROVIDER_SITE_OTHER): Payer: Medicare PPO | Admitting: Family Medicine

## 2019-06-09 ENCOUNTER — Other Ambulatory Visit (HOSPITAL_COMMUNITY)
Admission: RE | Admit: 2019-06-09 | Discharge: 2019-06-09 | Disposition: A | Discharge status: Home / Self Care | Payer: Medicare PPO | Source: Ambulatory Visit | Attending: Family Medicine | Admitting: Family Medicine

## 2019-06-09 ENCOUNTER — Encounter: Payer: Self-pay | Admitting: Family Medicine

## 2019-06-09 VITALS — BP 122/60 | HR 92 | Wt 169.4 lb

## 2019-06-09 DIAGNOSIS — F1010 Alcohol abuse, uncomplicated: Secondary | ICD-10-CM | POA: Diagnosis not present

## 2019-06-09 DIAGNOSIS — I10 Essential (primary) hypertension: Secondary | ICD-10-CM | POA: Diagnosis not present

## 2019-06-09 DIAGNOSIS — N529 Male erectile dysfunction, unspecified: Secondary | ICD-10-CM

## 2019-06-09 DIAGNOSIS — Z7251 High risk heterosexual behavior: Secondary | ICD-10-CM | POA: Insufficient documentation

## 2019-06-09 DIAGNOSIS — R42 Dizziness and giddiness: Secondary | ICD-10-CM | POA: Diagnosis not present

## 2019-06-09 DIAGNOSIS — H8190 Unspecified disorder of vestibular function, unspecified ear: Secondary | ICD-10-CM

## 2019-06-09 NOTE — Progress Notes (Signed)
   CHIEF COMPLAINT / HPI:  Vertigo: feels off balanced in the morning.  Also occurs when he drives long distance and if someone else is driving.  When driving he describes it as feeling like cars are turning when they're going straight.  Doesn't get nauseous.  No tinnitus, no change in hearing, no ear pain.  Has been going on 5 years and has been the same each time.    Hypertension: Patient states he "feels great" since switching his blood pressure medications during last visit.  Takes his blood pressure medication every morning.  No side effects  Erectile dysfunction: Patient continues taking his viagra.  States it "helps his confidence". Has enough to last til the end of the month.  He is taking the whole pill instead of cutting them in half..     Alcohol: Patient vague about quantity of how much alcohol he uses and frequency.  Does appear to be moderate to heavy use.  Does state that people have told him in the past he should cut back on his drinking.  He does not believe it is currently affecting his life in a negative way.  Is retired therefore does not affect his work.  High risk sexual activity: Patient currently sexually active with multiple partners.  Does endorse that he uses condoms.  Patient willing to get STD checks today.     PERTINENT  PMH / PSH:    OBJECTIVE: BP 122/60   Pulse 92   Wt 169 lb 6.4 oz (76.8 kg)   SpO2 100%   BMI 24.31 kg/m   General: Alert and oriented.  No acute distress.  Thin African-American middle-aged man. CV: Regular rate and rhythm.  No murmurs.  No pitting edema. Pulmonary: Lungs clear to auscultation bilaterally.  No wheezes or crackles. MSK/neuro: Cranial nerves II through XII grossly intact.  Normal gait.  ASSESSMENT / PLAN:  Episodic recurrent vertigo Although this occurs mostly in his car, does not appear to be motion sickness as patient denies nausea or other complaints.  This is been chronic for 5 years now and he gets some relief with  meclizine.  Encourage patient to go to vestibular rehab at last appointment.  He has not gone.  Encouraged him again to go and that this would be his most effective treatment.  Essential hypertension Patient blood pressure very well controlled since switching to losartan and amlodipine.  Continue these medications  Alcohol abuse Patient continues to drink.  Several months ago he stated he was interested in alcohol rehab after speaking with his infectious disease doctor, but appears he is still not taken advantage of it.  Informed the patient's of the importance of alcohol cessation, but does not appear he is interested in stopping at this time.  Erectile dysfunction Patient satisfied with his quality of erection since taking Viagra.  Also some improvement in morning erections without medication after changing his blood pressure medicine.  High risk heterosexual behavior Patient currently having sex with multiple partners.  Does endorse condom use though.  Patient willing to get tested for sexually transmitted infections at this time.  Will notify him if these results are positive. -HIV, RPR, urine GC/CT     Benay Pike, MD [place]

## 2019-06-09 NOTE — Patient Instructions (Signed)
It was nice to see you again today,  Your blood pressure is great.  Keep up the good work.  Continue to take your medication daily.  I have gotten some blood test to check for common STIs.  I will call you if any of these results are abnormal.  For your dizziness, I have put in a referral to the vestibular rehab physical therapists.  The phone number is below if you want to call them yourself. [facility]) [phone]  As always, I think it is best for you to cut back on your drinking.  We usually recommend no more than 2 beers a day.  I do not need to see you again for 6 months.  You can make an appointment sooner if you need to discuss something.  Have a great day  Clemetine Marker, MD

## 2019-06-10 LAB — HIV ANTIBODY (ROUTINE TESTING W REFLEX): HIV Screen 4th Generation wRfx: NONREACTIVE

## 2019-06-10 LAB — RPR: RPR Ser Ql: NONREACTIVE

## 2019-06-11 LAB — URINE CYTOLOGY ANCILLARY ONLY
Chlamydia: NEGATIVE
Comment: NEGATIVE
Comment: NORMAL
Neisseria Gonorrhea: NEGATIVE

## 2019-06-15 DIAGNOSIS — Z7251 High risk heterosexual behavior: Secondary | ICD-10-CM | POA: Insufficient documentation

## 2019-06-15 NOTE — Assessment & Plan Note (Signed)
Patient currently having sex with multiple partners.  Does endorse condom use though.  Patient willing to get tested for sexually transmitted infections at this time.  Will notify him if these results are positive. -HIV, RPR, urine GC/CT

## 2019-06-15 NOTE — Assessment & Plan Note (Addendum)
Patient blood pressure very well controlled since switching to losartan and amlodipine.  Continue these medications

## 2019-06-15 NOTE — Assessment & Plan Note (Signed)
Patient satisfied with his quality of erection since taking Viagra.  Also some improvement in morning erections without medication after changing his blood pressure medicine.

## 2019-06-15 NOTE — Assessment & Plan Note (Signed)
Patient continues to drink.  Several months ago he stated he was interested in alcohol rehab after speaking with his infectious disease doctor, but appears he is still not taken advantage of it.  Informed the patient's of the importance of alcohol cessation, but does not appear he is interested in stopping at this time.

## 2019-06-15 NOTE — Assessment & Plan Note (Signed)
Although this occurs mostly in his car, does not appear to be motion sickness as patient denies nausea or other complaints.  This is been chronic for 5 years now and he gets some relief with meclizine.  Encourage patient to go to vestibular rehab at last appointment.  He has not gone.  Encouraged him again to go and that this would be his most effective treatment.

## 2019-06-24 ENCOUNTER — Telehealth: Payer: Self-pay | Admitting: Family Medicine

## 2019-06-24 NOTE — Telephone Encounter (Signed)
Called pt to inform him of negative STI results.  Patient was very happy to hear this. Advised pt to continue to wear practicing safe sex.

## 2019-06-27 MED FILL — SILDENAFIL CITRATE 50 MG TA: 50 | 10 days supply | Qty: 10 | Fill #1

## 2019-07-01 ENCOUNTER — Other Ambulatory Visit: Payer: Self-pay | Admitting: *Deleted

## 2019-07-01 ENCOUNTER — Other Ambulatory Visit: Payer: Self-pay

## 2019-07-01 MED ORDER — LOSARTAN POTASSIUM 100 MG PO TABS: 100.0000 mg | ORAL_TABLET | Freq: Every day | ORAL | 3 refills | Status: DC

## 2019-07-02 MED ORDER — SILDENAFIL CITRATE 50 MG PO TABS: 50.0000 mg | ORAL_TABLET | Freq: Every day | ORAL | 3 refills | Status: DC | PRN

## 2019-07-02 MED ORDER — LOSARTAN POTASSIUM 100 MG PO TABS: 100.0000 mg | ORAL_TABLET | Freq: Every day | ORAL | 3 refills | Status: DC

## 2019-07-02 MED ORDER — AMLODIPINE BESYLATE 10 MG PO TABS: 10.0000 mg | ORAL_TABLET | Freq: Every day | ORAL | 3 refills | Status: DC

## 2019-08-18 MED FILL — SILDENAFIL CITRATE 50 MG TA: 50 | 10 days supply | Qty: 10 | Fill #2

## 2019-10-16 ENCOUNTER — Other Ambulatory Visit: Payer: Self-pay | Admitting: Family Medicine

## 2019-10-17 ENCOUNTER — Other Ambulatory Visit: Payer: Self-pay | Admitting: Family Medicine

## 2019-10-17 MED FILL — SILDENAFIL CITRATE 50 MG TA: 50 | 10 days supply | Qty: 10 | Fill #0

## 2019-11-20 MED FILL — AMLODIPINE BESYLATE 10 MG T: 10 | 90 days supply | Qty: 90 | Fill #1

## 2019-11-20 MED FILL — LOSARTAN POTASSIUM 100 MG T: 100 | 90 days supply | Qty: 90 | Fill #1

## 2019-12-11 MED FILL — SILDENAFIL CITRATE 50 MG TA: 50 | 10 days supply | Qty: 10 | Fill #1

## 2020-01-26 MED FILL — SILDENAFIL CITRATE 50 MG TA: 50 | 10 days supply | Qty: 10 | Fill #2

## 2020-02-23 MED FILL — AMLODIPINE BESYLATE 10 MG T: 10 | 90 days supply | Qty: 90 | Fill #2

## 2020-02-23 MED FILL — LOSARTAN POTASSIUM 100 MG T: 100 | 90 days supply | Qty: 90 | Fill #2

## 2020-05-13 ENCOUNTER — Other Ambulatory Visit: Payer: Self-pay

## 2020-05-13 ENCOUNTER — Encounter: Payer: Self-pay | Admitting: Family Medicine

## 2020-05-13 ENCOUNTER — Ambulatory Visit (INDEPENDENT_AMBULATORY_CARE_PROVIDER_SITE_OTHER): Payer: Medicare Other | Admitting: Family Medicine

## 2020-05-13 ENCOUNTER — Other Ambulatory Visit: Payer: Self-pay | Admitting: Family Medicine

## 2020-05-13 VITALS — BP 138/72 | HR 69 | Ht 68.5 in | Wt 160.0 lb

## 2020-05-13 DIAGNOSIS — I10 Essential (primary) hypertension: Secondary | ICD-10-CM | POA: Diagnosis not present

## 2020-05-13 DIAGNOSIS — F1010 Alcohol abuse, uncomplicated: Secondary | ICD-10-CM | POA: Diagnosis not present

## 2020-05-13 DIAGNOSIS — B182 Chronic viral hepatitis C: Secondary | ICD-10-CM

## 2020-05-13 DIAGNOSIS — N529 Male erectile dysfunction, unspecified: Secondary | ICD-10-CM

## 2020-05-13 DIAGNOSIS — R351 Nocturia: Secondary | ICD-10-CM | POA: Diagnosis not present

## 2020-05-13 DIAGNOSIS — R350 Frequency of micturition: Secondary | ICD-10-CM

## 2020-05-13 DIAGNOSIS — E559 Vitamin D deficiency, unspecified: Secondary | ICD-10-CM

## 2020-05-13 MED ORDER — SILDENAFIL CITRATE 100 MG PO TABS: 100.0000 mg | ORAL_TABLET | Freq: Every day | ORAL | 5 refills | Status: DC | PRN

## 2020-05-13 MED FILL — SILDENAFIL CITRATE 100 MG T: 100 | 20 days supply | Qty: 20 | Fill #0

## 2020-05-13 NOTE — Patient Instructions (Addendum)
It was nice to see you again today,   I will get some blood tests to check your liver function, your vitamin d levels, and a screen for prostate cancer.    I will increase your viagra dose to 100mg .  If this makes you dizzy or lightheaded please stop taking it and let us know.   Have a great day,   Clemetine Marker, MD

## 2020-05-13 NOTE — Progress Notes (Signed)
SUBJECTIVE:   CHIEF COMPLAINT / HPI:   HTN: Patient is taking his losartan and amlodipine as prescribed.  He takes them at night.  Erectile dysfunction: Patient feels like the Viagra is not working as well.  Sometimes it does not work at all and other times it leads to incomplete erections.  Does not get any dizziness or lightheadedness when he takes the medication at the current dose.  Alcohol use: Patient drinks daily.  States he will go through "a sixpack" in a day.  Is retired and has no other hobbies that he does during the day.  He does not interfere with his other aspects of his life.  He will also drink more heavily on weekends.  Nocturia: Patient states he wakes up 3 times at night to urinate.  No family history of prostate cancer.  Has no difficulties voiding such as difficulty initiating or stopping stream.  History of hepatitis C: Patient had 8 weeks of treatment for hepatitis C 2 years ago.  No abdominal pain since that time.  Is okay with getting repeat lab work today.  PERTINENT  PMH / PSH: Alcohol abuse, tobacco use, HTN, hepatitis C  OBJECTIVE:   BP 138/72   Pulse 69   Ht 5' 8.5" (1.74 m)   Wt 160 lb (72.6 kg)   SpO2 98%   BMI 23.97 kg/m   General: Alert and oriented.  No acute distress HEENT: PERRLA, EOMI. CV: Regular rate and rhythm, no murmurs Pulmonary: Lungs clear to auscultation bilaterally, no wheezes or crackles. GI: Soft, nontender.  Normal bowel sounds.  ASSESSMENT/PLAN:   Essential hypertension Well-controlled on current medications.  Continue amlodipine and losartan.  Erectile dysfunction Effects of Viagra decreasing recently.  Does not appear to be related to cholesterol as the patient's LDL was low and his HDL was extremely elevated last year and this has been evaluated by a cardiologist in the lipid clinic who reassured the patient that there is no cause for concern regarding his HDL.  Discussed possibility of switching to different  medication including a daily medication such as Cialis.  Will increase to 100 mg for Viagra and if this is ineffective or he does not tolerate it we can consider switching to Cialis at that time.  Patient has a diagnosis of Peyronie's disease which is likely contributing to his erectile dysfunction.  Alcohol use also likely contributing.  Discussed reducing alcohol intake with patient.  Alcohol abuse Patient drinking well over the recommended limit of alcohol daily.  Given that the patient is retired his alcohol use is not interfering with the rest of his daily activities.  Lab work of his liver has been normal thus far.  We will repeat that today.  Strongly encourage patient to reduce his alcohol intake but patient not motivated to reduce it at this time.  Chronic hepatitis C without hepatic coma ([place]) Per his last infectious disease note, the patient was to get a hep C quant lab test and then repeat again in 4 weeks.  He got the initial test but did not ever get the repeat labs.  Patient okay with getting repeat lab today.  Also getting CMP.  Nocturia Patient states he is up to urinate 3 times at night.  No history of prostate cancer given his risk factors including age, African descent, smoking, alcohol abuse we will get a PSA.  If normal we will treat as BPH with Flomax.  If elevated will refer to urology.  Benay Pike, MD [place]

## 2020-05-14 DIAGNOSIS — R351 Nocturia: Secondary | ICD-10-CM | POA: Insufficient documentation

## 2020-05-14 NOTE — Assessment & Plan Note (Signed)
Effects of Viagra decreasing recently.  Does not appear to be related to cholesterol as the patient's LDL was low and his HDL was extremely elevated last year and this has been evaluated by a cardiologist in the lipid clinic who reassured the patient that there is no cause for concern regarding his HDL.  Discussed possibility of switching to different medication including a daily medication such as Cialis.  Will increase to 100 mg for Viagra and if this is ineffective or he does not tolerate it we can consider switching to Cialis at that time.  Patient has a diagnosis of Peyronie's disease which is likely contributing to his erectile dysfunction.  Alcohol use also likely contributing.  Discussed reducing alcohol intake with patient.

## 2020-05-14 NOTE — Assessment & Plan Note (Signed)
Patient states he is up to urinate 3 times at night.  No history of prostate cancer given his risk factors including age, African descent, smoking, alcohol abuse we will get a PSA.  If normal we will treat as BPH with Flomax.  If elevated will refer to urology.

## 2020-05-14 NOTE — Assessment & Plan Note (Signed)
Per his last infectious disease note, the patient was to get a hep C quant lab test and then repeat again in 4 weeks.  He got the initial test but did not ever get the repeat labs.  Patient okay with getting repeat lab today.  Also getting CMP.

## 2020-05-14 NOTE — Assessment & Plan Note (Signed)
Well-controlled on current medications.  Continue amlodipine and losartan.

## 2020-05-14 NOTE — Assessment & Plan Note (Signed)
Patient drinking well over the recommended limit of alcohol daily.  Given that the patient is retired his alcohol use is not interfering with the rest of his daily activities.  Lab work of his liver has been normal thus far.  We will repeat that today.  Strongly encourage patient to reduce his alcohol intake but patient not motivated to reduce it at this time.

## 2020-05-17 ENCOUNTER — Other Ambulatory Visit: Payer: Medicare Other

## 2020-05-17 ENCOUNTER — Other Ambulatory Visit: Payer: Self-pay | Admitting: Family Medicine

## 2020-05-17 ENCOUNTER — Other Ambulatory Visit: Payer: Self-pay

## 2020-05-17 MED FILL — LOSARTAN POTASSIUM 100 MG T: 100 | 30 days supply | Qty: 30 | Fill #0

## 2020-05-17 MED FILL — AMLODIPINE BESYLATE 10 MG T: 10 | 90 days supply | Qty: 90 | Fill #0

## 2020-05-18 LAB — COMPREHENSIVE METABOLIC PANEL
ALT: 14 IU/L (ref 0–44)
AST: 45 IU/L — ABNORMAL HIGH (ref 0–40)
Albumin/Globulin Ratio: 1.4 (ref 1.2–2.2)
Albumin: 4.9 g/dL — ABNORMAL HIGH (ref 3.8–4.8)
Alkaline Phosphatase: 142 IU/L — ABNORMAL HIGH (ref 44–121)
BUN/Creatinine Ratio: 8 — ABNORMAL LOW (ref 10–24)
BUN: 9 mg/dL (ref 8–27)
Bilirubin Total: 0.9 mg/dL (ref 0.0–1.2)
CO2: 23 mmol/L (ref 20–29)
Calcium: 10 mg/dL (ref 8.6–10.2)
Chloride: 98 mmol/L (ref 96–106)
Creatinine, Ser: 1.2 mg/dL (ref 0.76–1.27)
GFR calc Af Amer: 72 mL/min/{1.73_m2} (ref >59)
GFR calc non Af Amer: 62 mL/min/{1.73_m2} (ref >59)
Globulin, Total: 3.5 g/dL (ref 1.5–4.5)
Glucose: 82 mg/dL (ref 65–99)
Potassium: 4.4 mmol/L (ref 3.5–5.2)
Sodium: 138 mmol/L (ref 134–144)
Total Protein: 8.4 g/dL (ref 6.0–8.5)

## 2020-05-18 LAB — VITAMIN D 25 HYDROXY (VIT D DEFICIENCY, FRACTURES): Vit D, 25-Hydroxy: 17.9 ng/mL — ABNORMAL LOW (ref 30.0–100.0)

## 2020-05-18 LAB — PSA: Prostate Specific Ag, Serum: 10.3 ng/mL — ABNORMAL HIGH (ref 0.0–4.0)

## 2020-05-19 ENCOUNTER — Other Ambulatory Visit: Payer: Self-pay | Admitting: Family Medicine

## 2020-05-19 DIAGNOSIS — R972 Elevated prostate specific antigen [PSA]: Secondary | ICD-10-CM

## 2020-05-19 MED ORDER — VITAMIN D (ERGOCALCIFEROL) 1.25 MG (50000 UNIT) PO CAPS: 50000.0000 [IU] | ORAL_CAPSULE | ORAL | 0 refills | Status: DC

## 2020-05-19 MED FILL — VIT D2 1.25 MG (50,000 UNIT: 1.25 MG | 56 days supply | Qty: 8 | Fill #0

## 2020-05-19 NOTE — Progress Notes (Signed)
vitam 

## 2020-06-03 ENCOUNTER — Other Ambulatory Visit (HOSPITAL_COMMUNITY): Payer: Self-pay | Admitting: Urology

## 2020-06-03 MED FILL — TAMSULOSIN HCL 0.4 MG CAP: 0.4 | 30 days supply | Qty: 30 | Fill #0

## 2020-07-19 ENCOUNTER — Other Ambulatory Visit (HOSPITAL_COMMUNITY): Payer: Self-pay | Admitting: Urology

## 2020-07-28 ENCOUNTER — Other Ambulatory Visit (HOSPITAL_COMMUNITY): Payer: Self-pay

## 2020-07-28 MED FILL — Levofloxacin Tab 750 MG: ORAL | 1 days supply | Qty: 1 | Fill #0 | Status: AC

## 2020-09-21 ENCOUNTER — Other Ambulatory Visit (HOSPITAL_COMMUNITY): Payer: Self-pay

## 2020-09-21 MED FILL — Losartan Potassium Tab 100 MG: ORAL | 90 days supply | Qty: 90 | Fill #0 | Status: AC

## 2020-09-28 ENCOUNTER — Other Ambulatory Visit: Payer: Self-pay | Admitting: Urology

## 2020-10-11 NOTE — Patient Instructions (Addendum)
DUE TO COVID-19 ONLY ONE VISITOR IS ALLOWED TO COME WITH YOU AND STAY IN THE WAITING ROOM ONLY DURING PRE OP AND PROCEDURE DAY OF SURGERY. THE 1 VISITOR  MAY VISIT WITH YOU AFTER SURGERY IN YOUR PRIVATE ROOM DURING VISITING HOURS ONLY!  YOU NEED TO HAVE A COVID 19 TEST ON Wednesday 10/13/2020 @ 1210 pm, THIS TEST MUST BE DONE BEFORE SURGERY,  COVID TESTING SITE [place] [id number], IT IS ON THE RIGHT GOING OUT [address] APPROXIMATELY  2 MINUTES PAST [employer] ON THE RIGHT. ONCE YOUR COVID TEST IS COMPLETED,  PLEASE BEGIN THE QUARANTINE INSTRUCTIONS AS OUTLINED IN YOUR HANDOUT.                Jeffrey [name]  10/11/2020   Your procedure is scheduled on: Friday October 15, 2020   Report to [facility] Main  Entrance   Report to admitting at 0530 AM     Call this number if you have problems the morning of surgery [phone]    Remember: Do not eat food after Midnight. May take clear liquids from midnight till 0430 day of procedure then nothing by mouth.    BRUSH YOUR TEETH MORNING OF SURGERY AND RINSE YOUR MOUTH OUT, NO CHEWING GUM CANDY OR MINTS.     Take these medicines the morning of surgery with A SIP OF WATER: Amlodipine                                 You may not have any metal on your body including hair pins and              piercings  Do not wear jewelry,lotions, powders or colognes, deodorant             Do not wear nail polish on your fingernails.               Men may shave face and neck.   Do not bring valuables to the hospital. [facility].  Contacts, dentures or bridgework may not be worn into surgery.  Leave suitcase in the car. After surgery it may be brought to your room.     Patients discharged the day of surgery will not be allowed to drive home. IF YOU ARE HAVING SURGERY AND GOING HOME THE SAME DAY, YOU MUST HAVE AN ADULT TO DRIVE YOU HOME AND BE WITH YOU FOR 24 HOURS.  YOU MAY GO HOME BY TAXI OR UBER OR ORTHERWISE, BUT AN ADULT MUST ACCOMPANY YOU HOME AND STAY WITH YOU FOR 24 HOURS.  _____________________________________________________________________                CLEAR LIQUID DIET   Foods Allowed                                                                     Foods Excluded  Coffee and tea, regular and decaf  liquids that you cannot  Plain Jell-O any favor except red or purple                                           see through such as: Fruit ices (not with fruit pulp)                                     milk, soups, orange juice  Iced Popsicles                                    All solid food Carbonated beverages, regular and diet                                    Cranberry, grape and apple juices Sports drinks like Gatorade Lightly seasoned clear broth or consume(fat free) Sugar, honey syrup  Sample Menu Breakfast                                Lunch                                     Supper Cranberry juice                    Beef broth                            Chicken broth Jell-O                                     Grape juice                           Apple juice Coffee or tea                        Jell-O                                      Popsicle                                                Coffee or tea                        Coffee or tea  _____________________________________________________________________  [facility] - Preparing for Surgery Before surgery, you can play an important role.  Because skin is not sterile, your skin needs to be as free of germs as possible.  You can reduce the number of germs on your skin by washing with CHG (chlorahexidine gluconate) soap before surgery.  CHG is an antiseptic cleaner which kills  germs and bonds with the skin to continue killing germs even after washing. Please DO NOT use if you have an allergy to CHG or antibacterial soaps.  If your skin  becomes reddened/irritated stop using the CHG and inform your nurse when you arrive at Short Stay. Do not shave (including legs and underarms) for at least 48 hours prior to the first CHG shower.  You may shave your face/neck. Please follow these instructions carefully:  1.  Shower with CHG Soap the night before surgery and the  morning of Surgery.  2.  If you choose to wash your hair, wash your hair first as usual with your  normal  shampoo.  3.  After you shampoo, rinse your hair and body thoroughly to remove the  shampoo.                           4.  Use CHG as you would any other liquid soap.  You can apply chg directly  to the skin and wash                       Gently with a scrungie or clean washcloth.  5.  Apply the CHG Soap to your body ONLY FROM THE NECK DOWN.   Do not use on face/ open                           Wound or open sores. Avoid contact with eyes, ears mouth and genitals (private parts).                       Wash face,  Genitals (private parts) with your normal soap.             6.  Wash thoroughly, paying special attention to the area where your surgery  will be performed.  7.  Thoroughly rinse your body with warm water from the neck down.  8.  DO NOT shower/wash with your normal soap after using and rinsing off  the CHG Soap.                9.  Pat yourself dry with a clean towel.            10.  Wear clean pajamas.            11.  Place clean sheets on your bed the night of your first shower and do not  sleep with pets. Day of Surgery : Do not apply any lotions/deodorants the morning of surgery.  Please wear clean clothes to the hospital/surgery center.  FAILURE TO FOLLOW THESE INSTRUCTIONS MAY RESULT IN THE CANCELLATION OF YOUR SURGERY PATIENT SIGNATURE_________________________________  NURSE SIGNATURE__________________________________  ________________________________________________________________________ [facility] - Preparing for Surgery Before surgery, you  can play an important role.  Because skin is not sterile, your skin needs to be as free of germs as possible.  You can reduce the number of germs on your skin by washing with CHG (chlorahexidine gluconate) soap before surgery.  CHG is an antiseptic cleaner which kills germs and bonds with the skin to continue killing germs even after washing. Please DO NOT use if you have an allergy to CHG or antibacterial soaps.  If your skin becomes reddened/irritated stop using the CHG and inform your nurse when you arrive at Short Stay. Do not shave (including legs and underarms) for at least  48 hours prior to the first CHG shower.  You may shave your face/neck. Please follow these instructions carefully:  1.  Shower with CHG Soap the night before surgery and the  morning of Surgery.  2.  If you choose to wash your hair, wash your hair first as usual with your  normal  shampoo.  3.  After you shampoo, rinse your hair and body thoroughly to remove the  shampoo.                           4.  Use CHG as you would any other liquid soap.  You can apply chg directly  to the skin and wash                       Gently with a scrungie or clean washcloth.  5.  Apply the CHG Soap to your body ONLY FROM THE NECK DOWN.   Do not use on face/ open                           Wound or open sores. Avoid contact with eyes, ears mouth and genitals (private parts).                       Wash face,  Genitals (private parts) with your normal soap.             6.  Wash thoroughly, paying special attention to the area where your surgery  will be performed.  7.  Thoroughly rinse your body with warm water from the neck down.  8.  DO NOT shower/wash with your normal soap after using and rinsing off  the CHG Soap.                9.  Pat yourself dry with a clean towel.            10.  Wear clean pajamas.            11.  Place clean sheets on your bed the night of your first shower and do not  sleep with pets. Day of Surgery : Do not apply  any lotions/deodorants the morning of surgery.  Please wear clean clothes to the hospital/surgery center.  FAILURE TO FOLLOW THESE INSTRUCTIONS MAY RESULT IN THE CANCELLATION OF YOUR SURGERY PATIENT SIGNATURE_________________________________  NURSE SIGNATURE__________________________________  ________________________________________________________________________

## 2020-10-13 ENCOUNTER — Other Ambulatory Visit: Payer: Self-pay

## 2020-10-13 ENCOUNTER — Encounter (HOSPITAL_COMMUNITY): Payer: Self-pay

## 2020-10-13 ENCOUNTER — Other Ambulatory Visit (HOSPITAL_COMMUNITY)
Admission: RE | Admit: 2020-10-13 | Discharge: 2020-10-13 | Disposition: A | Discharge status: Home / Self Care | Payer: Medicare Other | Source: Ambulatory Visit | Attending: Urology | Admitting: Urology

## 2020-10-13 ENCOUNTER — Encounter (HOSPITAL_COMMUNITY)
Admission: RE | Admit: 2020-10-13 | Discharge: 2020-10-13 | Disposition: A | Discharge status: Home / Self Care | Payer: Medicare Other | Source: Ambulatory Visit | Attending: Urology | Admitting: Urology

## 2020-10-13 DIAGNOSIS — Z01818 Encounter for other preprocedural examination: Secondary | ICD-10-CM | POA: Insufficient documentation

## 2020-10-13 DIAGNOSIS — Z20822 Contact with and (suspected) exposure to covid-19: Secondary | ICD-10-CM | POA: Diagnosis not present

## 2020-10-13 LAB — COMPREHENSIVE METABOLIC PANEL
ALT: 34 U/L (ref 0–44)
AST: 87 U/L — ABNORMAL HIGH (ref 15–41)
Albumin: 4.4 g/dL (ref 3.5–5.0)
Alkaline Phosphatase: 115 U/L (ref 38–126)
Anion gap: 14 (ref 5–15)
BUN: 6 mg/dL — ABNORMAL LOW (ref 8–23)
CO2: 19 mmol/L — ABNORMAL LOW (ref 22–32)
Calcium: 9.8 mg/dL (ref 8.9–10.3)
Chloride: 101 mmol/L (ref 98–111)
Creatinine, Ser: 0.95 mg/dL (ref 0.61–1.24)
GFR, Estimated: >60 mL/min (ref >60)
Glucose, Bld: 101 mg/dL — ABNORMAL HIGH (ref 70–99)
Potassium: 3.3 mmol/L — ABNORMAL LOW (ref 3.5–5.1)
Sodium: 134 mmol/L — ABNORMAL LOW (ref 135–145)
Total Bilirubin: 1.6 mg/dL — ABNORMAL HIGH (ref 0.3–1.2)
Total Protein: 8.5 g/dL — ABNORMAL HIGH (ref 6.5–8.1)

## 2020-10-13 LAB — CBC
HCT: 38.3 % — ABNORMAL LOW (ref 39.0–52.0)
Hemoglobin: 13.7 g/dL (ref 13.0–17.0)
MCH: 36.3 pg — ABNORMAL HIGH (ref 26.0–34.0)
MCHC: 35.8 g/dL (ref 30.0–36.0)
MCV: 101.6 fL — ABNORMAL HIGH (ref 80.0–100.0)
Platelets: 261 10*3/uL (ref 150–400)
RBC: 3.77 MIL/uL — ABNORMAL LOW (ref 4.22–5.81)
RDW: 12.1 % (ref 11.5–15.5)
WBC: 7.9 10*3/uL (ref 4.0–10.5)
nRBC: 0 % (ref 0.0–0.2)

## 2020-10-13 LAB — SARS CORONAVIRUS 2 (TAT 6-24 HRS): SARS Coronavirus 2: NEGATIVE

## 2020-10-13 NOTE — Progress Notes (Signed)
   PCP - Dr Addison Naegeli per epic last office visit 05/13/20  EKG - per PAT appt 10/13/2020  History of chronic hep C, ETOH abuse   Activity level:   Can go up a flight of stairs and activities of daily living without stopping and without symptoms        Anesthesia review:   Patient denies shortness of breath, fever, cough and chest pain at PAT appointment   Patient verbalized understanding of instructions that were given to them at the PAT appointment. Patient was also instructed that they will need to review over the PAT instructions again at home before surgery.

## 2020-10-15 ENCOUNTER — Other Ambulatory Visit (HOSPITAL_COMMUNITY): Payer: Self-pay

## 2020-10-15 ENCOUNTER — Ambulatory Visit (HOSPITAL_COMMUNITY): Payer: Medicare Other | Admitting: Certified Registered Nurse Anesthetist

## 2020-10-15 ENCOUNTER — Encounter (HOSPITAL_COMMUNITY)
Admission: RE | Disposition: A | Discharge status: Home / Self Care | Payer: Self-pay | Source: Home / Self Care | Attending: Urology

## 2020-10-15 ENCOUNTER — Observation Stay (HOSPITAL_COMMUNITY)
Admission: RE | Admit: 2020-10-15 | Discharge: 2020-10-16 | Disposition: A | Discharge status: Home / Self Care | Payer: Medicare Other | Attending: Urology | Admitting: Urology

## 2020-10-15 ENCOUNTER — Encounter (HOSPITAL_COMMUNITY): Payer: Self-pay | Admitting: Urology

## 2020-10-15 DIAGNOSIS — Z96652 Presence of left artificial knee joint: Secondary | ICD-10-CM | POA: Insufficient documentation

## 2020-10-15 DIAGNOSIS — I10 Essential (primary) hypertension: Secondary | ICD-10-CM | POA: Insufficient documentation

## 2020-10-15 DIAGNOSIS — Z79899 Other long term (current) drug therapy: Secondary | ICD-10-CM | POA: Diagnosis not present

## 2020-10-15 DIAGNOSIS — N390 Urinary tract infection, site not specified: Secondary | ICD-10-CM | POA: Diagnosis not present

## 2020-10-15 DIAGNOSIS — F17210 Nicotine dependence, cigarettes, uncomplicated: Secondary | ICD-10-CM | POA: Diagnosis not present

## 2020-10-15 DIAGNOSIS — C61 Malignant neoplasm of prostate: Principal | ICD-10-CM | POA: Diagnosis present

## 2020-10-15 HISTORY — PX: LYMPHADENECTOMY: SHX5960

## 2020-10-15 HISTORY — PX: ROBOT ASSISTED LAPAROSCOPIC RADICAL PROSTATECTOMY: SHX5141

## 2020-10-15 LAB — HEMOGLOBIN AND HEMATOCRIT, BLOOD
HCT: 36.3 % — ABNORMAL LOW (ref 39.0–52.0)
Hemoglobin: 12.8 g/dL — ABNORMAL LOW (ref 13.0–17.0)

## 2020-10-15 LAB — TYPE AND SCREEN
ABO/RH(D): B POS
Antibody Screen: NEGATIVE

## 2020-10-15 LAB — ABO/RH: ABO/RH(D): B POS

## 2020-10-15 SURGERY — PROSTATECTOMY, RADICAL, ROBOT-ASSISTED, LAPAROSCOPIC
Anesthesia: General

## 2020-10-15 MED ORDER — DOCUSATE SODIUM 100 MG PO CAPS: 100.0000 mg | ORAL_CAPSULE | Freq: Two times a day (BID) | ORAL | Status: DC

## 2020-10-15 MED ORDER — DEXTROSE-NACL 5-0.45 % IV SOLN
INTRAVENOUS | Status: DC
Start: 2020-10-15 — End: 2020-10-16

## 2020-10-15 MED ORDER — ACETAMINOPHEN 325 MG PO TABS
325.0000 mg | ORAL_TABLET | Freq: Once | ORAL | Status: DC | PRN
Start: 2020-10-15 — End: 2020-10-15

## 2020-10-15 MED ORDER — HYDROCODONE-ACETAMINOPHEN 5-325 MG PO TABS
1.0000 | ORAL_TABLET | Freq: Four times a day (QID) | ORAL | 0 refills | Status: DC | PRN
  Filled 2020-10-15: qty 20, 3d supply, fill #0

## 2020-10-15 MED ORDER — BELLADONNA ALKALOIDS-OPIUM 16.2-60 MG RE SUPP
1.0000 | Freq: Four times a day (QID) | RECTAL | Status: DC | PRN
  Administered 2020-10-15: 1 via RECTAL

## 2020-10-15 MED ORDER — BACITRACIN-NEOMYCIN-POLYMYXIN 400-5-5000 EX OINT: 1.0000 "application " | TOPICAL_OINTMENT | Freq: Three times a day (TID) | CUTANEOUS | Status: DC | PRN

## 2020-10-15 MED ORDER — SODIUM CHLORIDE (PF) 0.9 % IJ SOLN
INTRAMUSCULAR | Status: AC
  Filled 2020-10-15: qty 20

## 2020-10-15 MED ORDER — DIPHENHYDRAMINE HCL 12.5 MG/5ML PO ELIX: 12.5000 mg | ORAL_SOLUTION | Freq: Four times a day (QID) | ORAL | Status: DC | PRN

## 2020-10-15 MED ORDER — HYDROMORPHONE HCL 1 MG/ML IJ SOLN
0.2500 mg | INTRAMUSCULAR | Status: DC | PRN
  Administered 2020-10-15 (×4): 0.5 mg via INTRAVENOUS

## 2020-10-15 MED ORDER — LIDOCAINE 2% (20 MG/ML) 5 ML SYRINGE
INTRAMUSCULAR | Status: DC | PRN
  Administered 2020-10-15: 40 mg via INTRAVENOUS

## 2020-10-15 MED ORDER — LABETALOL HCL 5 MG/ML IV SOLN
INTRAVENOUS | Status: AC
  Administered 2020-10-15: 5 mg via INTRAVENOUS
  Filled 2020-10-15: qty 4

## 2020-10-15 MED ORDER — FENTANYL CITRATE (PF) 250 MCG/5ML IJ SOLN
INTRAMUSCULAR | Status: AC
  Filled 2020-10-15: qty 5

## 2020-10-15 MED ORDER — ROCURONIUM BROMIDE 10 MG/ML (PF) SYRINGE
PREFILLED_SYRINGE | INTRAVENOUS | Status: DC | PRN
  Administered 2020-10-15 (×2): 20 mg via INTRAVENOUS
  Administered 2020-10-15: 60 mg via INTRAVENOUS

## 2020-10-15 MED ORDER — SODIUM CHLORIDE (PF) 0.9 % IJ SOLN
INTRAMUSCULAR | Status: DC | PRN
  Administered 2020-10-15: 20 mL

## 2020-10-15 MED ORDER — ACETAMINOPHEN 10 MG/ML IV SOLN: 1000.0000 mg | Freq: Once | INTRAVENOUS | Status: DC | PRN

## 2020-10-15 MED ORDER — LACTATED RINGERS IV SOLN: INTRAVENOUS | Status: DC

## 2020-10-15 MED ORDER — SUGAMMADEX SODIUM 200 MG/2ML IV SOLN
INTRAVENOUS | Status: DC | PRN
  Administered 2020-10-15: 200 mg via INTRAVENOUS

## 2020-10-15 MED ORDER — ACETAMINOPHEN 160 MG/5ML PO SOLN
325.0000 mg | Freq: Once | ORAL | Status: DC | PRN
Start: 2020-10-15 — End: 2020-10-15

## 2020-10-15 MED ORDER — CHLORHEXIDINE GLUCONATE CLOTH 2 % EX PADS: 6.0000 | MEDICATED_PAD | Freq: Every day | CUTANEOUS | Status: DC

## 2020-10-15 MED ORDER — DOCUSATE SODIUM 100 MG PO CAPS
100.0000 mg | ORAL_CAPSULE | Freq: Two times a day (BID) | ORAL | Status: DC
  Administered 2020-10-15 – 2020-10-16 (×2): 100 mg via ORAL
  Filled 2020-10-15 (×2): qty 1

## 2020-10-15 MED ORDER — PHENYLEPHRINE 40 MCG/ML (10ML) SYRINGE FOR IV PUSH (FOR BLOOD PRESSURE SUPPORT)
PREFILLED_SYRINGE | INTRAVENOUS | Status: AC
  Filled 2020-10-15: qty 10

## 2020-10-15 MED ORDER — ORAL CARE MOUTH RINSE: 15.0000 mL | Freq: Once | OROMUCOSAL | Status: AC

## 2020-10-15 MED ORDER — HYDROMORPHONE HCL 1 MG/ML IJ SOLN
0.5000 mg | INTRAMUSCULAR | Status: DC | PRN
  Administered 2020-10-15 (×2): 1 mg via INTRAVENOUS
  Filled 2020-10-15 (×2): qty 1

## 2020-10-15 MED ORDER — LIDOCAINE 2% (20 MG/ML) 5 ML SYRINGE
INTRAMUSCULAR | Status: AC
  Filled 2020-10-15: qty 5

## 2020-10-15 MED ORDER — DEXAMETHASONE SODIUM PHOSPHATE 10 MG/ML IJ SOLN
INTRAMUSCULAR | Status: AC
  Filled 2020-10-15: qty 1

## 2020-10-15 MED ORDER — PROMETHAZINE HCL 25 MG/ML IJ SOLN
6.2500 mg | INTRAMUSCULAR | Status: DC | PRN
  Administered 2020-10-15: 12.5 mg via INTRAVENOUS

## 2020-10-15 MED ORDER — HYDROMORPHONE HCL 1 MG/ML IJ SOLN
INTRAMUSCULAR | Status: AC
  Filled 2020-10-15: qty 2

## 2020-10-15 MED ORDER — DEXAMETHASONE SODIUM PHOSPHATE 4 MG/ML IJ SOLN
INTRAMUSCULAR | Status: DC | PRN
  Administered 2020-10-15: 10 mg via INTRAVENOUS

## 2020-10-15 MED ORDER — ONDANSETRON HCL 4 MG/2ML IJ SOLN
INTRAMUSCULAR | Status: AC
  Filled 2020-10-15: qty 2

## 2020-10-15 MED ORDER — ROCURONIUM BROMIDE 10 MG/ML (PF) SYRINGE
PREFILLED_SYRINGE | INTRAVENOUS | Status: AC
  Filled 2020-10-15: qty 10

## 2020-10-15 MED ORDER — HYDRALAZINE HCL 20 MG/ML IJ SOLN
INTRAMUSCULAR | Status: AC
  Filled 2020-10-15: qty 1

## 2020-10-15 MED ORDER — MIDAZOLAM HCL 5 MG/5ML IJ SOLN
INTRAMUSCULAR | Status: DC | PRN
  Administered 2020-10-15: 2 mg via INTRAVENOUS

## 2020-10-15 MED ORDER — AMISULPRIDE (ANTIEMETIC) 5 MG/2ML IV SOLN: 10.0000 mg | Freq: Once | INTRAVENOUS | Status: DC | PRN

## 2020-10-15 MED ORDER — GLYCOPYRROLATE PF 0.2 MG/ML IJ SOSY
PREFILLED_SYRINGE | INTRAMUSCULAR | Status: AC
  Filled 2020-10-15: qty 1

## 2020-10-15 MED ORDER — CEFAZOLIN SODIUM-DEXTROSE 2-4 GM/100ML-% IV SOLN
2.0000 g | INTRAVENOUS | Status: AC
  Administered 2020-10-15: 2 g via INTRAVENOUS
  Filled 2020-10-15: qty 100

## 2020-10-15 MED ORDER — HYDRALAZINE HCL 20 MG/ML IJ SOLN
5.0000 mg | Freq: Four times a day (QID) | INTRAMUSCULAR | Status: DC | PRN
  Administered 2020-10-15: 5 mg via INTRAVENOUS

## 2020-10-15 MED ORDER — SODIUM CHLORIDE 0.9 % IV BOLUS: 1000.0000 mL | Freq: Once | INTRAVENOUS | Status: DC

## 2020-10-15 MED ORDER — LOSARTAN POTASSIUM 50 MG PO TABS
100.0000 mg | ORAL_TABLET | Freq: Every day | ORAL | Status: DC
  Administered 2020-10-15: 100 mg via ORAL
  Filled 2020-10-15: qty 2

## 2020-10-15 MED ORDER — SULFAMETHOXAZOLE-TRIMETHOPRIM 800-160 MG PO TABS
1.0000 | ORAL_TABLET | Freq: Two times a day (BID) | ORAL | 0 refills | Status: DC
  Filled 2020-10-15: qty 6, 3d supply, fill #0

## 2020-10-15 MED ORDER — MIDAZOLAM HCL 2 MG/2ML IJ SOLN
INTRAMUSCULAR | Status: AC
  Filled 2020-10-15: qty 2

## 2020-10-15 MED ORDER — ONDANSETRON HCL 4 MG/2ML IJ SOLN: 4.0000 mg | INTRAMUSCULAR | Status: DC | PRN

## 2020-10-15 MED ORDER — MECLIZINE HCL 25 MG PO TABS: 25.0000 mg | ORAL_TABLET | Freq: Every day | ORAL | Status: DC | PRN

## 2020-10-15 MED ORDER — FENTANYL CITRATE (PF) 100 MCG/2ML IJ SOLN
INTRAMUSCULAR | Status: DC | PRN
  Administered 2020-10-15 (×3): 100 ug via INTRAVENOUS
  Administered 2020-10-15: 50 ug via INTRAVENOUS

## 2020-10-15 MED ORDER — LACTATED RINGERS IR SOLN
Status: DC | PRN
  Administered 2020-10-15: 1000 mL

## 2020-10-15 MED ORDER — PROPOFOL 10 MG/ML IV BOLUS
INTRAVENOUS | Status: DC | PRN
  Administered 2020-10-15: 130 mg via INTRAVENOUS

## 2020-10-15 MED ORDER — PHENYLEPHRINE 40 MCG/ML (10ML) SYRINGE FOR IV PUSH (FOR BLOOD PRESSURE SUPPORT)
PREFILLED_SYRINGE | INTRAVENOUS | Status: DC | PRN
  Administered 2020-10-15: 120 ug via INTRAVENOUS
  Administered 2020-10-15: 80 ug via INTRAVENOUS

## 2020-10-15 MED ORDER — OXYCODONE HCL 5 MG PO TABS
5.0000 mg | ORAL_TABLET | ORAL | Status: DC | PRN
  Administered 2020-10-16 (×2): 5 mg via ORAL
  Filled 2020-10-15 (×2): qty 1

## 2020-10-15 MED ORDER — PROPOFOL 10 MG/ML IV BOLUS
INTRAVENOUS | Status: AC
  Filled 2020-10-15: qty 20

## 2020-10-15 MED ORDER — LABETALOL HCL 5 MG/ML IV SOLN
5.0000 mg | INTRAVENOUS | Status: AC | PRN
  Administered 2020-10-15: 5 mg via INTRAVENOUS

## 2020-10-15 MED ORDER — DIPHENHYDRAMINE HCL 50 MG/ML IJ SOLN: 12.5000 mg | Freq: Four times a day (QID) | INTRAMUSCULAR | Status: DC | PRN

## 2020-10-15 MED ORDER — FENTANYL CITRATE (PF) 100 MCG/2ML IJ SOLN
INTRAMUSCULAR | Status: AC
  Filled 2020-10-15: qty 2

## 2020-10-15 MED ORDER — MEPERIDINE HCL 50 MG/ML IJ SOLN: 6.2500 mg | INTRAMUSCULAR | Status: DC | PRN

## 2020-10-15 MED ORDER — ACETAMINOPHEN 500 MG PO TABS
1000.0000 mg | ORAL_TABLET | Freq: Four times a day (QID) | ORAL | Status: AC
  Administered 2020-10-15 – 2020-10-16 (×4): 1000 mg via ORAL
  Filled 2020-10-15 (×4): qty 2

## 2020-10-15 MED ORDER — BELLADONNA ALKALOIDS-OPIUM 16.2-60 MG RE SUPP
RECTAL | Status: AC
  Filled 2020-10-15: qty 1

## 2020-10-15 MED ORDER — LACTATED RINGERS IV SOLN: INTRAVENOUS | Status: DC | PRN

## 2020-10-15 MED ORDER — CHLORHEXIDINE GLUCONATE CLOTH 2 % EX PADS
6.0000 | MEDICATED_PAD | Freq: Every day | CUTANEOUS | Status: DC
  Administered 2020-10-16: 6 via TOPICAL

## 2020-10-15 MED ORDER — MAGNESIUM CITRATE PO SOLN: 1.0000 | Freq: Once | ORAL | Status: DC

## 2020-10-15 MED ORDER — PROMETHAZINE HCL 25 MG/ML IJ SOLN
INTRAMUSCULAR | Status: AC
  Filled 2020-10-15: qty 1

## 2020-10-15 MED ORDER — BUPIVACAINE LIPOSOME 1.3 % IJ SUSP
20.0000 mL | Freq: Once | INTRAMUSCULAR | Status: AC
  Administered 2020-10-15: 20 mL
  Filled 2020-10-15: qty 20

## 2020-10-15 MED ORDER — CHLORHEXIDINE GLUCONATE 0.12 % MT SOLN
15.0000 mL | Freq: Once | OROMUCOSAL | Status: AC
  Administered 2020-10-15: 15 mL via OROMUCOSAL

## 2020-10-15 MED ORDER — AMLODIPINE BESYLATE 10 MG PO TABS
10.0000 mg | ORAL_TABLET | Freq: Every day | ORAL | Status: DC
  Administered 2020-10-15: 10 mg via ORAL
  Filled 2020-10-15: qty 1

## 2020-10-15 SURGICAL SUPPLY — 65 items
APPLICATOR COTTON TIP 6 STRL (MISCELLANEOUS) ×2 IMPLANT
APPLICATOR COTTON TIP 6IN STRL (MISCELLANEOUS) ×4
CATH FOLEY 2WAY SLVR 18FR 30CC (CATHETERS) ×4 IMPLANT
CATH TIEMANN FOLEY 18FR 5CC (CATHETERS) ×4 IMPLANT
CHLORAPREP W/TINT 26 (MISCELLANEOUS) ×4 IMPLANT
CLIP VESOLOCK LG 6/CT PURPLE (CLIP) ×12 IMPLANT
CNTNR URN SCR LID CUP LEK RST (MISCELLANEOUS) ×2 IMPLANT
CONT SPEC 4OZ STRL OR WHT (MISCELLANEOUS) ×2
COVER SURGICAL LIGHT HANDLE (MISCELLANEOUS) ×4 IMPLANT
COVER TIP SHEARS 8 DVNC (MISCELLANEOUS) ×2 IMPLANT
COVER TIP SHEARS 8MM DA VINCI (MISCELLANEOUS) ×2
COVER WAND RF STERILE (DRAPES) IMPLANT
CUTTER ECHEON FLEX ENDO 45 340 (ENDOMECHANICALS) ×4 IMPLANT
DECANTER SPIKE VIAL GLASS SM (MISCELLANEOUS) ×4 IMPLANT
DERMABOND ADVANCED (GAUZE/BANDAGES/DRESSINGS) ×2
DERMABOND ADVANCED .7 DNX12 (GAUZE/BANDAGES/DRESSINGS) ×2 IMPLANT
DRAIN CHANNEL RND F F (WOUND CARE) IMPLANT
DRAPE ARM DVNC X/XI (DISPOSABLE) ×8 IMPLANT
DRAPE COLUMN DVNC XI (DISPOSABLE) ×2 IMPLANT
DRAPE DA VINCI XI ARM (DISPOSABLE) ×8
DRAPE DA VINCI XI COLUMN (DISPOSABLE) ×2
DRAPE SURG IRRIG POUCH 19X23 (DRAPES) ×4 IMPLANT
DRSG TEGADERM 4X4.75 (GAUZE/BANDAGES/DRESSINGS) ×4 IMPLANT
ELECT PENCIL ROCKER SW 15FT (MISCELLANEOUS) IMPLANT
ELECT REM PT RETURN 15FT ADLT (MISCELLANEOUS) ×4 IMPLANT
GAUZE 4X4 16PLY RFD (DISPOSABLE) IMPLANT
GAUZE SPONGE 2X2 8PLY STRL LF (GAUZE/BANDAGES/DRESSINGS) IMPLANT
GLOVE SURG ENC MOIS LTX SZ6.5 (GLOVE) ×4 IMPLANT
GLOVE SURG ENC TEXT LTX SZ7.5 (GLOVE) ×8 IMPLANT
GLOVE SURG UNDER POLY LF SZ7.5 (GLOVE) ×4 IMPLANT
GOWN STRL REUS W/TWL LRG LVL3 (GOWN DISPOSABLE) ×12 IMPLANT
HOLDER FOLEY CATH W/STRAP (MISCELLANEOUS) ×4 IMPLANT
IRRIG SUCT STRYKERFLOW 2 WTIP (MISCELLANEOUS) ×4
IRRIGATION SUCT STRKRFLW 2 WTP (MISCELLANEOUS) ×2 IMPLANT
IV LACTATED RINGERS 1000ML (IV SOLUTION) ×4 IMPLANT
KIT PROCEDURE DA VINCI SI (MISCELLANEOUS) ×2
KIT PROCEDURE DVNC SI (MISCELLANEOUS) ×2 IMPLANT
KIT TURNOVER KIT A (KITS) ×4 IMPLANT
NEEDLE INSUFFLATION 14GA 120MM (NEEDLE) ×4 IMPLANT
NEEDLE SPNL 22GX7 QUINCKE BK (NEEDLE) ×4 IMPLANT
PACK ROBOT UROLOGY CUSTOM (CUSTOM PROCEDURE TRAY) ×4 IMPLANT
PAD POSITIONING PINK XL (MISCELLANEOUS) ×4 IMPLANT
PORT ACCESS TROCAR AIRSEAL 12 (TROCAR) ×2 IMPLANT
PORT ACCESS TROCAR AIRSEAL 5M (TROCAR) ×2
SEAL CANN UNIV 5-8 DVNC XI (MISCELLANEOUS) ×8 IMPLANT
SEAL XI 5MM-8MM UNIVERSAL (MISCELLANEOUS) ×8
SET TRI-LUMEN FLTR TB AIRSEAL (TUBING) ×4 IMPLANT
SOLUTION ELECTROLUBE (MISCELLANEOUS) ×4 IMPLANT
SPONGE GAUZE 2X2 STER 10/PKG (GAUZE/BANDAGES/DRESSINGS)
SPONGE LAP 4X18 RFD (DISPOSABLE) ×4 IMPLANT
STAPLE RELOAD 45 GRN (STAPLE) ×2 IMPLANT
STAPLE RELOAD 45MM GREEN (STAPLE) ×2
SUT ETHILON 3 0 PS 1 (SUTURE) ×4 IMPLANT
SUT MNCRL AB 4-0 PS2 18 (SUTURE) ×8 IMPLANT
SUT NOVA NAB DX-16 0-1 5-0 T12 (SUTURE) ×4 IMPLANT
SUT PDS AB 1 CT1 27 (SUTURE) ×8 IMPLANT
SUT VIC AB 2-0 SH 27 (SUTURE) ×2
SUT VIC AB 2-0 SH 27X BRD (SUTURE) ×2 IMPLANT
SUT VICRYL 0 UR6 27IN ABS (SUTURE) ×4 IMPLANT
SUT VLOC BARB 180 ABS3/0GR12 (SUTURE) ×12
SUTURE VLOC BRB 180 ABS3/0GR12 (SUTURE) ×6 IMPLANT
SYR 27GX1/2 1ML LL SAFETY (SYRINGE) ×4 IMPLANT
TOWEL OR NON WOVEN STRL DISP B (DISPOSABLE) ×4 IMPLANT
TROCAR XCEL NON-BLD 5MMX100MML (ENDOMECHANICALS) IMPLANT
WATER STERILE IRR 1000ML POUR (IV SOLUTION) ×4 IMPLANT

## 2020-10-15 NOTE — Op Note (Signed)
Jeffrey [name], Jeffrey [name] MEDICAL RECORD NO: [id number] ACCOUNT NO: [id number] DATE OF BIRTH: [birth year] FACILITY: [facility] LOCATION: [id number] PHYSICIAN: Alexis Frock, MD  Operative Report   DATE OF PROCEDURE: 10/15/2020  PROCEDURE PERFORMED:   1.  Laparoscopic adhesiolysis. 2.  Robotic-assisted laparoscopic radical prostatectomy with bilateral pelvic lymphadenectomy.  ESTIMATED BLOOD LOSS:  268 mL  COMPLICATIONS:  None.  SPECIMENS:   1.  Prostatic fat. 2.  Right external iliac lymph node, sentinel. 3.  Right obturator lymph node, sentinel. 4.  Left external iliac lymph node, sentinel. 5.  Left obturator lymph node, sentinel. 6.  Radical prostatectomy.  ASSISTANT:  Bari Mantis, PA  DRAINS:   1.  Jackson-Pratt drain to bulb suction. 2.  Foley catheter to straight drain.  FINDINGS:   1.  Relatively dense omental adhesions in the midline, mostly umbilical area as anticipated status post prior colon surgery. 2.  Sentinel lymph nodes bilaterally denoted on pathology requisition.  INDICATIONS:  The patient is a pleasant 70 year old man who was found on the elevated PSA workup to have multifocal adenocarcinoma of the prostate, mostly on the right side.  This is clinically localized.  I discussed management including surveillance  protocols versus ablative therapy versus surgical extirpation.  He does already have some significant obstructive voiding symptoms and therefore, I lean towards surgery curative intent.  He underwent staging CT scan given his extensive abdominal surgical  history, which did corroborate some anterior abdominal wall.  We discussed the need for possible adhesiolysis as well.  Informed consent was obtained and placed in medical record.  DESCRIPTION OF PROCEDURE:  The patient being identified, procedure being radical prostatectomy was confirmed.  Procedure timeout was performed.  Intravenous antibiotics administered.  General endotracheal anesthesia were induced.  The  patient was placed  in a low lithotomy position.  Sterile field was created.  Prepped and draped the patient's penis, perineum and proximal thigh using iodine and his infraxiphoid abdomen using chlorhexidine gluconate, after clipper shaving and further fashioned on the  operating table using 3 inch tape with foam padding and his arms were tucked to the side using gel rolls.  A test steep Trendelenburg positioning was performed.  He was found to be suitably positioned.  Next, a high-flow, low pressure, pneumoperitoneum  was obtained using Veress technique in the left lower quadrant approximately 4 fingerbreadths lateral to the umbilicus.  This site was chosen as appeared to have a preserved fat plane on CT imaging.  An 8 mm robotic camera port was then placed in same  location.  Laparoscope examination of the peritoneal cavity did reveal no visceral injury.  There was a line of fairly dense adhesions, mostly omental in the midline in the periumbilical region as anticipated.  Additional ports were placed as follows:   Right paramedian 8 mm robotic port, right far lateral 12 mm AirSeal assistant port, left far lateral 8 mm robotic port.  The adhesions were in the location necessary to place a camera port and for specimen extraction.  Therefore, prior to placing port  here laparoscopic adhesiolysis was performed, dropping down the omental attachments using cold scissors and developing adhesions free plane in the midline to a position approximately 6 cm superior to the umbilicus and a final robotic camera port was then  placed in position approximately 1 fingerbreadth above the umbilicus.  Robot was then docked and passed the electronic checks.  Attention was directed at development of space of Retzius.  Incision was made lateral to the right medial umbilical ligament  from the midline towards the area of the internal ring coursing along the iliac vessels towards the area of the right ureter.  Right vas deferens  was encountered, purposely ligated, using medial bucket handle on the right bladder wall also placed in the  pelvic sidewall towards the endopelvic fascia on the right side.  A mirror image was performed on the left side.  Anterior attachments were taken down with cautery scissors to expose the anterior base of the prostate, which was defatted to better denote  the prostate in bladder neck junction.  This set aside and labeled as periprosthetic fat.  Next, 0.2 mL of indocyanine green dye was injected into each lobe of the prostate using a percutaneously placed robotically guided spinal needle with intervening  sections to prevent dye spillage which did not occur.  Next, the endopelvic fascia was swept away from the lateral aspect of the prostate in a base to apex orientation to exposed the dorsal venous complex, which was carefully controlled using a green  load stapler.  Taken exquisite care to avoid membranous urethral injury, which did not occur.  It had been approximately 10 minutes post dye injection, the pelvis was inspected under near infrared fluorescence light.  Sentinel lymphangiography revealed excellent parenchymal uptake of the prostate and several areas of the lymph nodes within the standard template on both sides.  The right external obturator and contralateral lymph node field as such a standard template  lymphangiography was performed first on the right side, the right external group with the boundaries being the right external iliac artery, vein, pelvic sidewall, iliac bifurcation.  Lymphostasis achieved with cold clips.  It was set aside, labeled as  right external iliac lymph nodes, sentinel.  Next, right obturator group dissected free.  The boundaries being the right external iliac vein, pelvic sidewall, obturator nerve.  Lymphostasis were achieved with cold clips.  It was set aside labeled right  obturator lymph node, sentinel.  Right obturator nerve was dissected bluntly and then it  was found to be uninjured.  Contralateral node dissection was performed on the left side and again the left obturator nerve was also inspected and found to be  uninjured.  Attention was directed to bladder dissection.  Next, the bladder neck area was identified by moving the Foley catheter back and forth and a lateral release was performed on each side to even further better denote bladder neck prostate  junction, which was carefully separated using cautery scissor dissection of the anterior, posterior direction keeping what appeared to be a rim of circular muscle fibers at each plane of dissection.  This was performed adherently complete bladder neck  preservation.  Posterior dissection was performed by incising approximately 7 mm inferior posterior portion of the prostate entering the plane of Denonvilliers.  Bilateral vas deferens were encountered,  dissected and reduced approximately 4 cm, ligated  and placed on gentle superior traction.  Bilateral seminal vesicles were dissected to their tip and placed on gentle superior retraction.  Dissection proceeded with this plane towards the area of the apex of the prostate.  This exposed the vascular  pedicles on each side.  Given the predominant tumor on the right side a purposeful wide dissection was performed using sequential clipping technique, in a base to apex orientation on the left side given the paucity of tumor in this location, very  aggressive nerve preservation was performed.  Final apical dissection was performed in the anterior midline, transecting the membranous urethra coldly.  This completely  freed up the prostatectomy specimen was placed in the EndoCatch bag for later  retrieval. Next, digital rectal exam was performed using the indicator glove and laparoscopic vision.  No evidence of rectal violation was noted.  Next, posterior reconstruction was performed using 3-0 V-Loc suture reapproximating the posterior urethral  plate to the posterior  bladder neck, bringing the structures in a tension-free apposition and final mucosa-to-mucosa anastomosis was performed using double-armed 3-0 V-Loc suture from the 6 o'clock to 12 o'clock position, which showed an excellent  tension-free apposition.  A new Foley catheter was placed per urethra, which irrigated quantitatively.  Sponge and needle counts were correct.  Hemostasis was excellent.  Close suction drain was brought through the previous left lateral most robotic port  site into the peritoneal cavity.  The right previous 12 mm assistant port site was closed at the fascia using Carter-Thomason suture passer and 0 Vicryl.  The specimen was retrieved by undocking the robot and extending the previous camera port site  inferiorly.  This is approximately 3.5 cm.  Removing the prostate specimen setting aside for permanent pathology.  Given his significant scarring in the midline from prior surgery fascia was closed using a figure-of-eight Novafil nonabsorbable suture x4  followed by reapproximating the Scarpa's with running Vicryl.  All incision sites were infiltrated with dilute lipolyzed Marcaine and closed the skin using subcuticular Monocryl and Dermabond.  Procedure was then terminated.  The patient tolerated the  procedure well, no immediate perioperative complications.  The patient was taken to postanesthesia care unit in stable condition.  Plan for observation admission.  Please note first assistant, Debbrah Alar was crucial for all portions of the procedure today. She provided valuable retraction, suctioning, specimen manipulation, vascular stapling, lymphatic clipping and general first assistance.   PUS D: 10/15/2020 10:15:43 am T: 10/15/2020 2:38:00 pm  JOB: [id number]/ [id number]

## 2020-10-15 NOTE — Transfer of Care (Signed)
Immediate Anesthesia Transfer of Care Note  Patient: Jeffrey [name]  Procedure(s) Performed: XI ROBOTIC ASSISTED LAPAROSCOPIC RADICAL PROSTATECTOMY, INDOCYANINE GREEN DYE INJECTION AND POSSIBLE ADHESIOLYSIS LYMPHADENECTOMY (Bilateral)  Patient Location: PACU  Anesthesia Type:General  Level of Consciousness: awake, alert , oriented and patient cooperative  Airway & Oxygen Therapy: Patient Spontanous Breathing and Patient connected to face mask  Post-op Assessment: Report given to RN and Post -op Vital signs reviewed and stable  Post vital signs: Reviewed and stable  Last Vitals:  Vitals Value Taken Time  BP 158/113 10/15/20 1015  Temp    Pulse    Resp 11 10/15/20 1016  SpO2    Vitals shown include unvalidated device data.  Last Pain:  Vitals:   10/15/20 0608  TempSrc: Oral  PainSc:          Complications: No notable events documented.

## 2020-10-15 NOTE — Anesthesia Preprocedure Evaluation (Addendum)
Anesthesia Evaluation  Patient identified by MRN, date of birth, ID band Patient awake    Reviewed: Allergy & Precautions, NPO status , Patient's Chart, lab work & pertinent test results  Airway Mallampati: I  TM Distance: >3 FB Neck ROM: Full    Dental  (+) Missing, Poor Dentition, Dental Advisory Given,    Pulmonary Current Smoker and Patient abstained from smoking.,    Pulmonary exam normal        Cardiovascular hypertension, Pt. on medications  Rhythm:Regular Rate:Normal     Neuro/Psych PSYCHIATRIC DISORDERS negative neurological ROS     GI/Hepatic GERD  ,(+) Hepatitis -, C  Endo/Other  negative endocrine ROS  Renal/GU Renal disease     Musculoskeletal  (+) Arthritis ,   Abdominal   Peds  Hematology negative hematology ROS (+)   Anesthesia Other Findings   Reproductive/Obstetrics                            Anesthesia Physical Anesthesia Plan  ASA: 2  Anesthesia Plan: General   Post-op Pain Management:    Induction: Intravenous  PONV Risk Score and Plan: 2 and Ondansetron, Dexamethasone and Midazolam  Airway Management Planned: Oral ETT  Additional Equipment: None  Intra-op Plan:   Post-operative Plan: Extubation in OR  Informed Consent: I have reviewed the patients History and Physical, chart, labs and discussed the procedure including the risks, benefits and alternatives for the proposed anesthesia with the patient or authorized representative who has indicated his/her understanding and acceptance.     Dental advisory given  Plan Discussed with: CRNA  Anesthesia Plan Comments:         Anesthesia Quick Evaluation

## 2020-10-15 NOTE — Discharge Instructions (Addendum)

## 2020-10-15 NOTE — H&P (Signed)
Jeffrey [name] is an 70 y.o. male.    Chief Complaint: Pre-Op Prostatectomy  HPI:   1 - Moderate Risk Prostate Cancer - 6/12 cores up to 70% grade 3 cancer by BX 2022 on eval PSA 11.7 by Dr. Milford [name]. All Rt sided cores positive. TRUS 72mL, no median.   2 - Lower Urinary Tract Symptoms - on tamsulosin with good control of mild obstructive sympotms. PVR 2022 "45mL" (normal).   PMH sig for knee replacement, open partial colon resection for polyps (primary anastamosis). No ischemic CV disease / blood thinners. His PCP is with [facility] on [address]. 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   Today " Jeffrey [name] " is seen to proceed with prostatectomy. No interval fevers. Interval CT w/o advanced disase, some colon in aposition to anterior abdominal wall at area of umbilicus.    Past Medical History:  Diagnosis Date   Arthritis    Chronic hepatitis C ([place])    Hepatic cysts 12/01/2013   Seen on CT   Hypertension    Renal cysts 12/01/2013   Seen on CT    Past Surgical History:  Procedure Laterality Date   COLON SURGERY Right 09/2010   benign neoplasm of colon, s/p R colectomy   REPLACEMENT TOTAL KNEE Left 07/06/2014   Left knee replacement    Family History  Problem Relation Age of Onset   Cancer Maternal Aunt    Social History:  reports that he has been smoking cigarettes. He has never used smokeless tobacco. He reports current alcohol use of about 42.0 standard drinks of alcohol per week. He reports that he does not use drugs.  Allergies: No Known Allergies  Medications Prior to Admission  Medication Sig Dispense Refill   amLODipine (NORVASC) 10 MG tablet TAKE 1 TABLET (10 MG TOTAL) BY MOUTH AT BEDTIME. (Patient taking differently: Take 10 mg by mouth at bedtime.) 90 tablet 3   losartan (COZAAR) 100 MG tablet TAKE 1 TABLET (100 MG TOTAL) BY MOUTH AT BEDTIME. (Patient taking differently: Take 100 mg by mouth at bedtime.) 90 tablet 3   meclizine (ANTIVERT) 25 MG  tablet Take 1 tablet (25 mg total) by mouth daily as needed. 30 tablet 3   cetirizine (ZYRTEC) 10 MG tablet Take 1 tablet (10 mg total) by mouth daily. (Patient not taking: Reported on 10/11/2020) 30 tablet 11   chlorhexidine (PERIDEX) 0.12 % solution Use as directed 15 mLs in the mouth or throat 2 (two) times daily. (Patient not taking: Reported on 10/11/2020) 120 mL 0   erythromycin ophthalmic ointment Place a 1/2 inch ribbon of ointment into the lower eyelid. (Patient not taking: Reported on 10/11/2020) 1 g 0   polyethylene glycol powder (GLYCOLAX/MIRALAX) powder Take 17 g by mouth daily. (Patient not taking: Reported on 10/11/2020) 3350 g 5   sildenafil (VIAGRA) 100 MG tablet TAKE 1 TABLET (100 MG TOTAL) BY MOUTH DAILY AS NEEDED FOR ERECTILE DYSFUNCTION. (Patient not taking: Reported on 10/11/2020) 20 tablet 5   tamsulosin (FLOMAX) 0.4 MG CAPS capsule TAKE 1 CAPSULE BY MOUTH EVERY DAY (Patient not taking: Reported on 10/11/2020) 30 capsule 11   Vitamin D, Ergocalciferol, (DRISDOL) 1.25 MG (50000 UNIT) CAPS capsule TAKE 1 CAPSULE (50,000 UNITS TOTAL) BY MOUTH EVERY 7 (SEVEN) DAYS. (Patient not taking: Reported on 10/11/2020) 8 capsule 0    Results for orders placed or performed during the hospital encounter of 10/13/20 (from the past 48 hour(s))  SARS CORONAVIRUS 2 (TAT 6-24 HRS) Nasopharyngeal Nasopharyngeal Swab  Status: None   Collection Time: 10/13/20 11:38 AM   Specimen: Nasopharyngeal Swab  Result Value Ref Range   SARS Coronavirus 2 NEGATIVE NEGATIVE    Comment: (NOTE) SARS-CoV-2 target nucleic acids are NOT DETECTED.  The SARS-CoV-2 RNA is generally detectable in upper and lower respiratory specimens during the acute phase of infection. Negative results do not preclude SARS-CoV-2 infection, do not rule out co-infections with other pathogens, and should not be used as the sole basis for treatment or other patient management decisions. Negative results must be combined with clinical  observations, patient history, and epidemiological information. The expected result is Negative.  Fact Sheet for Patients: SugarRoll.be  Fact Sheet for Healthcare Providers: https://www.woods-mathews.com/  This test is not yet approved or cleared by the [place] FDA and  has been authorized for detection and/or diagnosis of SARS-CoV-2 by FDA under an Emergency Use Authorization (EUA). This EUA will remain  in effect (meaning this test can be used) for the duration of the COVID-19 declaration under Se ction 564(b)(1) of the Act, 21 U.S.C. section 360bbb-3(b)(1), unless the authorization is terminated or revoked sooner.  Performed at [facility], [place] [address], [place], [place] [address]    No results found.  Review of Systems  Constitutional:  Negative for chills and fever.  All other systems reviewed and are negative.  Blood pressure (!) 133/92, pulse 82, temperature 98.2 F (36.8 C), temperature source Oral, resp. rate 18, height 5\' 8"  (1.727 m), weight 73.4 kg, SpO2 99 %. Physical Exam Vitals reviewed.  HENT:     Head: Normocephalic.     Nose: Nose normal.  Cardiovascular:     Rate and Rhythm: Normal rate.  Pulmonary:     Effort: Pulmonary effort is normal.  Abdominal:     Comments: Prior scars w/o henrias.   Genitourinary:    Comments: No CVAT Musculoskeletal:        General: Normal range of motion.     Cervical back: Normal range of motion.  Skin:    General: Skin is warm.  Neurological:     General: No focal deficit present.  Psychiatric:        Mood and Affect: Mood normal.     Assessment/Plan  Proceed as planned with robotic prostatectomy / node dissection, possible adhesiolysis for prostate cancer. Risks, benefits, alternatives, expected peri-op course discussed previously and reiterated today.    Alexis Frock, MD 10/15/2020, 6:34 AM

## 2020-10-15 NOTE — Brief Op Note (Signed)
10/15/2020  10:05 AM  PATIENT:  Jeffrey [name]  70 y.o. male  PRE-OPERATIVE DIAGNOSIS:  PROSTATE CANCER  POST-OPERATIVE DIAGNOSIS:  PROSTATE CANCER  PROCEDURE:  Procedure(s) with comments: XI ROBOTIC ASSISTED LAPAROSCOPIC RADICAL PROSTATECTOMY, INDOCYANINE GREEN DYE INJECTION AND POSSIBLE ADHESIOLYSIS (N/A) - 3 HRS LYMPHADENECTOMY (Bilateral)  SURGEON:  Surgeon(s) and Role:    Alexis Frock, MD - Primary  PHYSICIAN ASSISTANT:   ASSISTANTS: Debbrah Alar PA   ANESTHESIA:   local and general  EBL:  200 mL   BLOOD ADMINISTERED:none  DRAINS:  JP to bulb    LOCAL MEDICATIONS USED:  MARCAINE     SPECIMEN:  Source of Specimen:  1 - periprostatic fat; 2- pelvic lymph nodes; 3 - prostatectomy  DISPOSITION OF SPECIMEN:  PATHOLOGY  COUNTS:  YES  TOURNIQUET:  * No tourniquets in log *  DICTATION: .Other Dictation: Dictation Number [id number]  PLAN OF CARE: Admit for overnight observation  PATIENT DISPOSITION:  PACU - hemodynamically stable.   Delay start of Pharmacological VTE agent (>24hrs) due to surgical blood loss or risk of bleeding: yes

## 2020-10-15 NOTE — Anesthesia Postprocedure Evaluation (Signed)
Anesthesia Post Note  Patient: Jeffrey [name]  Procedure(s) Performed: XI ROBOTIC ASSISTED LAPAROSCOPIC RADICAL PROSTATECTOMY, INDOCYANINE GREEN DYE INJECTION AND POSSIBLE ADHESIOLYSIS LYMPHADENECTOMY (Bilateral)     Patient location during evaluation: PACU Anesthesia Type: General Level of consciousness: awake and alert Pain management: pain level controlled Vital Signs Assessment: post-procedure vital signs reviewed and stable Respiratory status: spontaneous breathing, nonlabored ventilation, respiratory function stable and patient connected to nasal cannula oxygen Cardiovascular status: blood pressure returned to baseline and stable Postop Assessment: no apparent nausea or vomiting Anesthetic complications: no   No notable events documented.  Last Vitals:  Vitals:   10/15/20 1330 10/15/20 1345  BP: 132/86   Pulse: 76   Resp:    Temp:  36.6 C  SpO2: 96%     Last Pain:  Vitals:   10/15/20 1330  TempSrc:   PainSc: 0-No pain                 Effie Berkshire

## 2020-10-15 NOTE — Anesthesia Procedure Notes (Signed)
Procedure Name: Intubation Date/Time: 10/15/2020 7:40 AM Performed by: Claudia Desanctis, CRNA Pre-anesthesia Checklist: Patient identified, Emergency Drugs available, Suction available and Patient being monitored Patient Re-evaluated:Patient Re-evaluated prior to induction Oxygen Delivery Method: Circle system utilized Preoxygenation: Pre-oxygenation with 100% oxygen Induction Type: IV induction Ventilation: Mask ventilation without difficulty Laryngoscope Size: 2 and Miller Grade View: Grade I Tube type: Oral Tube size: 7.5 mm Number of attempts: 1 Airway Equipment and Method: Stylet Placement Confirmation: ETT inserted through vocal cords under direct vision, positive ETCO2 and breath sounds checked- equal and bilateral Secured at: 22 cm Tube secured with: Tape Dental Injury: Teeth and Oropharynx as per pre-operative assessment

## 2020-10-15 NOTE — Care Management Obs Status (Signed)
[facility] NOTIFICATION   Patient Details  Name: Jeffrey [name] MRN: [id number] Date of Birth: 1950/04/29   Medicare Observation Status Notification Given:  Yes    Dessa Phi, RN 10/15/2020, 3:07 PM

## 2020-10-16 ENCOUNTER — Other Ambulatory Visit: Payer: Self-pay

## 2020-10-16 ENCOUNTER — Encounter (HOSPITAL_COMMUNITY): Payer: Self-pay | Admitting: Urology

## 2020-10-16 DIAGNOSIS — C61 Malignant neoplasm of prostate: Secondary | ICD-10-CM | POA: Diagnosis not present

## 2020-10-16 MED ORDER — SULFAMETHOXAZOLE-TRIMETHOPRIM 800-160 MG PO TABS: 1.0000 | ORAL_TABLET | Freq: Two times a day (BID) | ORAL | 0 refills | Status: DC

## 2020-10-16 MED ORDER — HYDROCODONE-ACETAMINOPHEN 5-325 MG PO TABS: 1.0000 | ORAL_TABLET | Freq: Four times a day (QID) | ORAL | 0 refills | Status: DC | PRN

## 2020-10-16 MED ORDER — TRAMADOL HCL 50 MG PO TABS: 50.0000 mg | ORAL_TABLET | Freq: Four times a day (QID) | ORAL | Status: DC | PRN

## 2020-10-16 MED ORDER — BISACODYL 10 MG RE SUPP
10.0000 mg | Freq: Once | RECTAL | Status: AC
  Administered 2020-10-16: 10 mg via RECTAL
  Filled 2020-10-16: qty 1

## 2020-10-16 NOTE — Discharge Summary (Signed)
  Date of admission: 10/15/2020  Date of discharge: 10/16/2020  Admission diagnosis: Prostate Cancer  Discharge diagnosis: Prostate Cancer  History and Physical: For full details, please see admission history and physical. Briefly, Jeffrey [name] is a 70 y.o. gentleman with localized prostate cancer.  After discussing management/treatment options, he elected to proceed with surgical treatment.  Hospital Course: Jeffrey [name] was taken to the operating room on 10/15/2020 and underwent a robotic assisted laparoscopic radical prostatectomy. He tolerated this procedure well and without complications. Postoperatively, he was able to be transferred to a regular hospital room following recovery from anesthesia.  He was able to begin ambulating the night of surgery. He remained hemodynamically stable overnight.  He had excellent urine output with appropriately minimal output from his pelvic drain and his pelvic drain was removed on POD #1.  He was transitioned to oral pain medication, tolerated a clear liquid diet, and had met all discharge criteria and was able to be discharged home later on POD#1.  Laboratory values:  Recent Labs    10/13/20 1045 10/15/20 1034  HGB 13.7 12.8*  HCT 38.3* 36.3*    Disposition: Home  Discharge instruction: He was instructed to be ambulatory but to refrain from heavy lifting, strenuous activity, or driving. He was instructed on urethral catheter care.  Discharge medications:   Allergies as of 10/16/2020   No Known Allergies      Medication List     STOP taking these medications    cetirizine 10 MG tablet Commonly known as: ZYRTEC   chlorhexidine 0.12 % solution Commonly known as: PERIDEX   erythromycin ophthalmic ointment   polyethylene glycol powder 17 GM/SCOOP powder Commonly known as: GLYCOLAX/MIRALAX   sildenafil 100 MG tablet Commonly known as: VIAGRA   tamsulosin 0.4 MG Caps capsule Commonly known as: FLOMAX   Vitamin D (Ergocalciferol)  1.25 MG (50000 UNIT) Caps capsule Commonly known as: DRISDOL       TAKE these medications    docusate sodium 100 MG capsule Commonly known as: COLACE Take 1 capsule (100 mg total) by mouth 2 (two) times daily.   HYDROcodone-acetaminophen 5-325 MG tablet Commonly known as: Norco Take 1-2 tablets by mouth every 6 (six) hours as needed for moderate pain.   meclizine 25 MG tablet Commonly known as: ANTIVERT Take 1 tablet (25 mg total) by mouth daily as needed.   sulfamethoxazole-trimethoprim 800-160 MG tablet Commonly known as: BACTRIM DS Take 1 tablet by mouth 2 (two) times daily. Start the day prior to foley removal appointment       ASK your doctor about these medications    amLODipine 10 MG tablet Commonly known as: NORVASC TAKE 1 TABLET (10 MG TOTAL) BY MOUTH AT BEDTIME.   losartan 100 MG tablet Commonly known as: COZAAR TAKE 1 TABLET (100 MG TOTAL) BY MOUTH AT BEDTIME.        Followup: He will followup in 1 week for catheter removal and to discuss his surgical pathology results.

## 2020-10-16 NOTE — Progress Notes (Signed)
Patient discharging home.  IV removed - WNL.  Reviewed AVS and medications.  Educated on catheter care at home and given supplies, patient demonstrated and verbalizes understanding.  Follow up in place for 7/5.  Emphasized importance of ambulation, staying hydrated and use of incentive spirometer to prevent PNA.  Patient has no questions at this time - waiting on significant other to pick up this evening around 9pm.  Patient in NAD at this time.

## 2020-10-16 NOTE — Progress Notes (Signed)
Patient ID: Jeffrey [name], male   DOB: 02/13/1951, 70 y.o.   MRN: [id number]   1 Day Post-Op Subjective: The patient is doing well.  No nausea or vomiting. Pain is adequately controlled.  Objective: Vital signs in last 24 hours: Temp:  [97.6 F (36.4 C)-97.9 F (36.6 C)] 97.8 F (36.6 C) (06/25 0300) Pulse Rate:  [66-86] 84 (06/25 0300) Resp:  [10-20] 20 (06/25 0300) BP: (130-188)/(65-175) 130/65 (06/25 0300) SpO2:  [93 %-100 %] 97 % (06/25 0300)  Intake/Output from previous day: 06/24 0701 - 06/25 0700 In: 3245.4 [I.V.:3145.4; IV Piggyback:100] Out: 1945 [Urine:1450; Drains:295; Blood:200] Intake/Output this shift: Total I/O In: 1145.4 [I.V.:1145.4] Out: 910 [Urine:750; Drains:160]  Physical Exam:  General: Alert and oriented. CV: RRR Lungs: Clear bilaterally. GI: Soft, Nondistended. Incisions: Clean, dry, and intact Urine: Clear Extremities: Nontender, no erythema, no edema.  Lab Results: Recent Labs    10/13/20 1045 10/15/20 1034  HGB 13.7 12.8*  HCT 38.3* 36.3*      Assessment/Plan: POD# 1 s/p robotic prostatectomy.  1) SL IVF 2) Ambulate, Incentive spirometry 3) Transition to oral pain medication 4) Dulcolax suppository 5) D/C pelvic drain 6) Plan for likely discharge later today   Pryor Curia. MD   LOS: 0 days   Dutch Gray 10/16/2020, 6:37 AM

## 2020-10-16 NOTE — Progress Notes (Signed)
Patient walked up and down the hall twice between 1900 and 0700, was well tolerated.

## 2020-10-16 NOTE — Progress Notes (Signed)
Site where JP removed leaking large amount of serosanguinous fluid.  Soaked bed, dressing changed with clean gauze

## 2020-10-19 LAB — SURGICAL PATHOLOGY

## 2020-10-22 ENCOUNTER — Encounter (HOSPITAL_COMMUNITY): Payer: Self-pay | Admitting: Urology

## 2020-10-27 ENCOUNTER — Other Ambulatory Visit (HOSPITAL_COMMUNITY): Payer: Self-pay

## 2020-11-08 ENCOUNTER — Other Ambulatory Visit: Payer: Self-pay

## 2020-11-08 ENCOUNTER — Ambulatory Visit (HOSPITAL_COMMUNITY)
Admission: EM | Admit: 2020-11-08 | Discharge: 2020-11-08 | Disposition: A | Discharge status: Home / Self Care | Payer: Medicare Other | Attending: Family Medicine | Admitting: Family Medicine

## 2020-11-08 ENCOUNTER — Encounter (HOSPITAL_COMMUNITY): Payer: Self-pay

## 2020-11-08 ENCOUNTER — Ambulatory Visit (INDEPENDENT_AMBULATORY_CARE_PROVIDER_SITE_OTHER): Payer: Medicare Other

## 2020-11-08 ENCOUNTER — Other Ambulatory Visit (HOSPITAL_COMMUNITY): Payer: Self-pay

## 2020-11-08 DIAGNOSIS — M25551 Pain in right hip: Secondary | ICD-10-CM | POA: Diagnosis not present

## 2020-11-08 MED ORDER — HYDROCODONE-ACETAMINOPHEN 5-325 MG PO TABS
0.5000 | ORAL_TABLET | Freq: Four times a day (QID) | ORAL | 0 refills | Status: DC | PRN
  Filled 2020-11-08: qty 8, 2d supply, fill #0

## 2020-11-08 MED ORDER — HYDROCODONE-ACETAMINOPHEN 5-325 MG PO TABS
ORAL_TABLET | ORAL | Status: AC
  Filled 2020-11-08: qty 1

## 2020-11-08 MED ORDER — HYDROCODONE-ACETAMINOPHEN 5-325 MG PO TABS
1.0000 | ORAL_TABLET | Freq: Once | ORAL | Status: AC
  Administered 2020-11-08: 1 via ORAL

## 2020-11-08 NOTE — Discharge Instructions (Addendum)
Your hip and pelvis x-rays showed no broken bones.  Be aware, you have been prescribed pain medications that may cause drowsiness. While taking this medication, do not take any other medications containing acetaminophen (Tylenol). Do not combine with alcohol or other illicit drugs. Please do not drive, operate heavy machinery, or take part in activities that require making important decisions while on this medication as your judgement may be clouded. If not allergic, you may take ibuprofen with this medicine.

## 2020-11-08 NOTE — ED Provider Notes (Signed)
[place]   [id number] 11/08/20 Arrival Time: 4098  ASSESSMENT & PLAN:  1. Right hip pain    I have personally viewed the imaging studies ordered this visit. No hip or pelvic fracture appreciated.  Meds ordered this encounter  Medications   HYDROcodone-acetaminophen (NORCO/VICODIN) 5-325 MG per tablet 1 tablet   HYDROcodone-acetaminophen (NORCO/VICODIN) 5-325 MG tablet    Sig: Take 1/2 to 1 tablet every 6 hours as needed for severe pain.    Dispense:  8 tablet    Refill:  0   [place] Controlled Substances Registry consulted for this patient. I feel the risk/benefit ratio today is favorable for proceeding with this prescription for a controlled substance. Medication sedation precautions given.  Orders Placed This Encounter  Procedures   DG Hip Unilat W or Wo Pelvis 1 View Right    Recommend:  Follow-up Information     Carollee Leitz, MD .   Specialty: Family Medicine Why: As needed. Contact information: [address] [address] [place] .   Why: If worsening or failing to improve as anticipated over the next several days to one week. Contact information: [address] [phone]                 Discharge Instructions      Your hip and pelvis x-rays showed no broken bones.  Be aware, you have been prescribed pain medications that may cause drowsiness. While taking this medication, do not take any other medications containing acetaminophen (Tylenol). Do not combine with alcohol or other illicit drugs. Please do not drive, operate heavy machinery, or take part in activities that require making important decisions while on this medication as your judgement may be clouded. If not allergic, you may take ibuprofen with this medicine.     WBAT.  Reviewed expectations re: course of current medical issues. Questions answered. Outlined signs  and symptoms indicating need for more acute intervention. Patient verbalized understanding. After Visit Summary given.  SUBJECTIVE: History from: patient. Jeffrey [name] is a 70 y.o. male who reports R hip pain; noted after fall yesterday; unable to elaborate; immed able to bear weight "but hurts to walk". No extremity sensation changes or weakness. No abd pain. Normal bowel/bladder habits. OTC Tylenol without help. Aggravating factors: rest. Alleviating factors: certain movements and weight bearing. Associated symptoms: none reported. Extremity sensation changes or weakness: none. No tx PTA.   Past Surgical History:  Procedure Laterality Date   COLON SURGERY Right 09/2010   benign neoplasm of colon, s/p R colectomy   LYMPHADENECTOMY Bilateral 10/15/2020   Procedure: LYMPHADENECTOMY;  Surgeon: Alexis Frock, MD;  Location: [facility];  Service: Urology;  Laterality: Bilateral;   REPLACEMENT TOTAL KNEE Left 07/06/2014   Left knee replacement   ROBOT ASSISTED LAPAROSCOPIC RADICAL PROSTATECTOMY N/A 10/15/2020   Procedure: XI ROBOTIC ASSISTED LAPAROSCOPIC RADICAL PROSTATECTOMY, INDOCYANINE GREEN DYE INJECTION AND ADHESIOLYSIS;  Surgeon: Alexis Frock, MD;  Location: [facility];  Service: Urology;  Laterality: N/A;  3 HRS      OBJECTIVE:  Vitals:   11/08/20 1130  BP: 111/79  Pulse: (!) 133  Resp: 17  Temp: 99.5 F (37.5 C)  TempSrc: Oral  SpO2: 97%    Recheck P: 104. Does appear to be in pain. General appearance: alert; no distress HEENT: Spur; AT Neck: supple with FROM Resp: unlabored respirations CV: reg Extremities: RLE: warm with  well perfused appearance; poorly localized moderate tenderness over right lateral hip; without gross deformities; swelling: none; bruising: none; hip ROM: normal, with discomfort CV: brisk extremity capillary refill of RLE; 2+ DP pulse of RLE. Skin: warm and dry; no visible rashes Neurologic: favors RLE when standing but able to bear weight; normal  sensation and strength of bilateral LE Psychological: alert and cooperative; normal mood and affect  Imaging: DG Hip Unilat W or Wo Pelvis 1 View Right  Result Date: 11/08/2020 CLINICAL DATA:  Fall.  Right hip pain. EXAM: DG HIP (WITH OR WITHOUT PELVIS) 1V RIGHT COMPARISON:  None. FINDINGS: There is no evidence of hip fracture or dislocation. There is no evidence of arthropathy or other focal bone abnormality. IMPRESSION: Negative right hip radiographs. Electronically Signed   By: San Morelle M.D.   On: 11/08/2020 12:17      No Known Allergies  Past Medical History:  Diagnosis Date   Arthritis    Chronic hepatitis C ([place])    Hepatic cysts 12/01/2013   Seen on CT   Hypertension    Renal cysts 12/01/2013   Seen on CT   Social History   Socioeconomic History   Marital status: Single    Spouse name: Not on file   Number of children: Not on file   Years of education: Not on file   Highest education level: Not on file  Occupational History   Not on file  Tobacco Use   Smoking status: Every Day    Types: Cigarettes   Smokeless tobacco: Never  Vaping Use   Vaping Use: Never used  Substance and Sexual Activity   Alcohol use: Yes    Alcohol/week: 42.0 standard drinks    Types: 42 Standard drinks or equivalent per week    Comment: 3-4 vodka drinks a day   Drug use: No   Sexual activity: Yes    Partners: Female  Other Topics Concern   Not on file  Social History Narrative   Not on file   Social Determinants of Health   Financial Resource Strain: Not on file  Food Insecurity: Not on file  Transportation Needs: Not on file  Physical Activity: Not on file  Stress: Not on file  Social Connections: Not on file   Family History  Problem Relation Age of Onset   Cancer Maternal Aunt    Past Surgical History:  Procedure Laterality Date   COLON SURGERY Right 09/2010   benign neoplasm of colon, s/p R colectomy   LYMPHADENECTOMY Bilateral 10/15/2020   Procedure:  LYMPHADENECTOMY;  Surgeon: Alexis Frock, MD;  Location: [facility];  Service: Urology;  Laterality: Bilateral;   REPLACEMENT TOTAL KNEE Left 07/06/2014   Left knee replacement   ROBOT ASSISTED LAPAROSCOPIC RADICAL PROSTATECTOMY N/A 10/15/2020   Procedure: XI ROBOTIC ASSISTED LAPAROSCOPIC RADICAL PROSTATECTOMY, INDOCYANINE GREEN DYE INJECTION AND ADHESIOLYSIS;  Surgeon: Alexis Frock, MD;  Location: [facility];  Service: Urology;  Laterality: N/A;  3 HRS       Vanessa Kick, MD 11/10/20 [phone]

## 2020-11-08 NOTE — ED Triage Notes (Signed)
Pt presents with back pain after a fall yesterday; pt has surgical prostate procedure 2 weeks ago.

## 2020-11-24 ENCOUNTER — Ambulatory Visit: Payer: Medicare Other | Admitting: Family Medicine

## 2020-11-25 ENCOUNTER — Ambulatory Visit
Admission: RE | Admit: 2020-11-25 | Discharge: 2020-11-25 | Disposition: A | Discharge status: Home / Self Care | Payer: Medicare Other | Source: Ambulatory Visit | Attending: Family Medicine | Admitting: Family Medicine

## 2020-11-25 ENCOUNTER — Other Ambulatory Visit: Payer: Self-pay

## 2020-11-25 ENCOUNTER — Other Ambulatory Visit (HOSPITAL_COMMUNITY): Payer: Self-pay

## 2020-11-25 ENCOUNTER — Ambulatory Visit (INDEPENDENT_AMBULATORY_CARE_PROVIDER_SITE_OTHER): Payer: Medicare Other | Admitting: Family Medicine

## 2020-11-25 VITALS — BP 120/72 | HR 104 | Ht 68.0 in | Wt 152.4 lb

## 2020-11-25 DIAGNOSIS — M545 Low back pain, unspecified: Secondary | ICD-10-CM

## 2020-11-25 MED ORDER — KETOROLAC TROMETHAMINE 60 MG/2ML IM SOLN
60.0000 mg | Freq: Once | INTRAMUSCULAR | Status: AC
  Administered 2020-11-25: 60 mg via INTRAMUSCULAR

## 2020-11-25 MED ORDER — NAPROXEN 500 MG PO TABS
500.0000 mg | ORAL_TABLET | Freq: Two times a day (BID) | ORAL | 0 refills | Status: DC
  Filled 2020-11-25: qty 30, 15d supply, fill #0

## 2020-11-25 NOTE — Progress Notes (Signed)
    SUBJECTIVE:   CHIEF COMPLAINT / HPI:   [place] three weeks ago  Patient reports that he fell 3 weeks ago and landed on his hip.  He was seen in urgent care where they took a picture of his hip and gave him some hydrocodone.  No fractures appreciated.  Since then he has had worsening low back pain.  Considerable pain with any movement whatsoever.  Reports that he took all of the hydrocodone that was prescribed and needs something for pain at this time.  Denies any red flag symptoms such as loss of bowel or bladder function.  OBJECTIVE:   BP 120/72   Pulse (!) 104   Ht '5\' 8"'$  (1.727 m)   Wt 152 lb 6.4 oz (69.1 kg)   SpO2 100%   BMI 23.17 kg/m   General: 70 year old male, complaining of low back pain Cardiac: Regular rate and rhythm, no murmurs appreciated Respiratory: Normal work of breathing, lungs clearbilaterally MSK: Decreased range of motion with flexion extension of lower back, extreme diffuse tenderness of lower back able to ambulate with the assistance of a cane  ASSESSMENT/PLAN:   Bilateral low back pain without sciatica Patient with fall approximately 3 weeks ago.  Landed on his hip so x-rays were obtained of the hip but is now complaining that his back is severely hurting.  He has taken all of the pain medications given to him by urgent care.  Given the duration and severity of the pain I am going to get imaging of his low back.  Given a shot of Toradol today in clinic and recommended to start naproxen tomorrow.  Counseled on avoiding other NSAIDs.  Recommend he can also take Tylenol as well.  Strict ED and return precautions given.  Patient is agreeable to this.     Gifford Shave, MD [place]

## 2020-11-25 NOTE — Patient Instructions (Signed)
It was great seeing you today.  I am sorry you are having trouble with the back pain.  We gave you a shot of Toradol to help with the pain and I want to get imaging of your back.  I also sent a prescription for naproxen to your pharmacy.  Start taking this tomorrow and you will take it twice daily as needed for the pain.  I would like to see you back in approximately 2 weeks for a follow-up visit to make sure your back pain is improving.  If you have any questions or concerns call the clinic.  If you have any worsening symptoms or loss of bowel or bladder control please seek medical attention at the emergency department.  I hope you have a wonderful afternoon!

## 2020-11-26 DIAGNOSIS — M545 Low back pain, unspecified: Secondary | ICD-10-CM | POA: Insufficient documentation

## 2020-11-26 NOTE — Assessment & Plan Note (Signed)
Patient with fall approximately 3 weeks ago.  Landed on his hip so x-rays were obtained of the hip but is now complaining that his back is severely hurting.  He has taken all of the pain medications given to him by urgent care.  Given the duration and severity of the pain I am going to get imaging of his low back.  Given a shot of Toradol today in clinic and recommended to start naproxen tomorrow.  Counseled on avoiding other NSAIDs.  Recommend he can also take Tylenol as well.  Strict ED and return precautions given.  Patient is agreeable to this.

## 2020-11-27 ENCOUNTER — Telehealth: Payer: Self-pay | Admitting: Family Medicine

## 2020-11-27 DIAGNOSIS — S32010S Wedge compression fracture of first lumbar vertebra, sequela: Secondary | ICD-10-CM

## 2020-11-27 NOTE — Telephone Encounter (Signed)
Patient regarding the results from his lumbar x-ray.  He has a L1 compression fracture which is causing his pain.  My plan is to refer to a spine specialist for any treatment options.  I discussed this with on-call attending Dr. Gwendlyn Deutscher and she is in agreement with this plan.  Discussed at length red flag symptoms with the patient such as lower extremity weakness, loss of bowel or bladder function.  We also discussed pain management.  He will go to the emergency department if he has any red flag symptoms or is unable to control the pain.  He has no further questions or concerns.  Patient is scheduled to follow-up with our clinic on the 12th but hopefully he will be able to get in with a spinal specialist before that.

## 2020-12-03 ENCOUNTER — Ambulatory Visit (INDEPENDENT_AMBULATORY_CARE_PROVIDER_SITE_OTHER): Payer: Medicare Other | Admitting: Orthopaedic Surgery

## 2020-12-03 ENCOUNTER — Ambulatory Visit (INDEPENDENT_AMBULATORY_CARE_PROVIDER_SITE_OTHER): Payer: Medicare Other | Admitting: Family Medicine

## 2020-12-03 ENCOUNTER — Other Ambulatory Visit: Payer: Self-pay

## 2020-12-03 ENCOUNTER — Ambulatory Visit: Payer: Self-pay

## 2020-12-03 ENCOUNTER — Other Ambulatory Visit (HOSPITAL_COMMUNITY): Payer: Self-pay

## 2020-12-03 VITALS — BP 110/70 | HR 106 | Temp 97.9°F | Ht 68.0 in | Wt 151.2 lb

## 2020-12-03 VITALS — BP 116/75 | HR 101 | Ht 68.0 in | Wt 151.2 lb

## 2020-12-03 DIAGNOSIS — M545 Low back pain, unspecified: Secondary | ICD-10-CM

## 2020-12-03 DIAGNOSIS — L03116 Cellulitis of left lower limb: Secondary | ICD-10-CM | POA: Diagnosis not present

## 2020-12-03 DIAGNOSIS — S32010A Wedge compression fracture of first lumbar vertebra, initial encounter for closed fracture: Secondary | ICD-10-CM | POA: Diagnosis not present

## 2020-12-03 DIAGNOSIS — M7989 Other specified soft tissue disorders: Secondary | ICD-10-CM

## 2020-12-03 DIAGNOSIS — M79672 Pain in left foot: Secondary | ICD-10-CM

## 2020-12-03 DIAGNOSIS — M25572 Pain in left ankle and joints of left foot: Secondary | ICD-10-CM

## 2020-12-03 MED ORDER — KETOROLAC TROMETHAMINE 30 MG/ML IJ SOLN
30.0000 mg | Freq: Once | INTRAMUSCULAR | Status: AC
  Administered 2020-12-03: 30 mg via INTRAMUSCULAR

## 2020-12-03 MED ORDER — CEPHALEXIN 500 MG PO CAPS
500.0000 mg | ORAL_CAPSULE | Freq: Three times a day (TID) | ORAL | 0 refills | Status: AC
  Filled 2020-12-03: qty 30, 10d supply, fill #0

## 2020-12-03 NOTE — Progress Notes (Signed)
   SUBJECTIVE:   CHIEF COMPLAINT / HPI:   Chief Complaint  Patient presents with   Back Pain     Jeffrey [name] is a 70 y.o. male here for follow up for back pain. Pt fell in the kitchen about a month ago. He was seen in the ED and had negative hip xrays. Pt followed up with Dr. Caron Presume last week for ongoing pain. He had a compression fracture noted on his lumbar xrays. Pt has been taking Tylenol when needed for pain. Reports ongoing back pain that is worse with bending down. Reports Toradol shot given last week provided significant relief. Has not been taking Naprosyn that was prescribed by Dr. Caron Presume. No bowel/bladder incontinence or saddle paraesthesia .  He has an appointment with [facility] later today.   Pt reports left foot swelling and significant pain for the past week. He drinks alcoholic beverages "occasionally". Hx of alcohol abuse. No hx of gout. Denies any insect bites. No fevers. Reports chills.    PERTINENT  PMH / PSH: reviewed and updated as appropriate   OBJECTIVE:   BP 110/70   Pulse (!) 106   Temp 97.9 F (36.6 C)   Ht '5\' 8"'$  (1.727 m)   Wt 151 lb 4 oz (68.6 kg)   SpO2 99%   BMI 23.00 kg/m    GEN: well appearing male in no acute distress  CVS: well perfused, borderline tachycardic, regular rhythm  RESP: speaking in full sentences without pause, no respiratory distress  MSK: left foot (see image below), diffuse edema, erythema and ?warmth, tenderness to midfoot, right foot without swelling, tenderness or warmth  Lumbar spine: - Inspection: no gross deformity or asymmetry, swelling or ecchymosis. No skin changes - Palpation: No TTP over the spinous processes, paraspinal muscles, or SI joints b/l - ROM: full active ROM of the lumbar spine in flexion and extension without pain - Strength: 5/5 strength of lower extremity in L4-S1 nerve root distributions b/l - Neuro: sensation intact in the L4-S1 nerve root distribution b/l, 2+ L4 and S1 reflexes - Special  testing: Negative straight leg raise     ASSESSMENT/PLAN:   Compression fracture of L1 lumbar vertebra (HCC) Toradol 30 mg injection today. Pt to take Tylenol 1300 mg BID PRN. Limit NSAID use 2/2 to age. Patient to follow up with [facility] today at 2:15 PM. Consider intranasal calcitonin(200u). Pt may need bone density screening for osteoporosis if compression fx.   Cellulitis of left lower extremity Left foot cellulitis. Treat with Keflex TID for 10 days. Doubt gout as it is atypical location. Return precautions given. Defer need for imaging to ortho as pt has appt today.      Lyndee Hensen, DO PGY-3, [facility] 12/05/2020

## 2020-12-03 NOTE — Progress Notes (Signed)
Office Visit Note   Patient: Jeffrey [name]           Date of Birth: [birth year]           MRN: [id number] Visit Date: 12/03/2020              Requested by: Zenia Resides, MD [address] [place],  [place] [address] PCP: Carollee Leitz, MD   Assessment & Plan: Visit Diagnoses:  1. Pain in left foot   2. Pain in left ankle and joints of left foot   3. Foot swelling   4. Compression fracture of L1 vertebra, initial encounter ([place])     Plan: Patient's foot and ankle x-rays are negative for acute injury.  X-rays 1 week ago showed compression of L1 20%.  He can continue using Tylenol extra strength for pain.  Recheck 4 to 5 wks. Repeat L1 xrays on return AP and lateral   Follow-Up Instructions: Return in about 5 weeks (around 01/07/2021).   Orders:  Orders Placed This Encounter  Procedures   XR Ankle Complete Left   XR Foot Complete Left   No orders of the defined types were placed in this encounter.     Procedures: No procedures performed   Clinical Data: No additional findings.   Subjective: No chief complaint on file.   HPI 70 year old male referred by [facility] service for history of a fall 11/07/2020 when he fell in the kitchen.  He denies loss of consciousness just slipped and had back pain since that time.  X-rays taken 11/26/2020 showed 20% L1 compression fracture.  This involves a superior endplate.  He states he is gotten slightly better in the last 4 weeks.  He is also noticed greater than 1 week history of severe left foot and ankle pain with significant swelling.  Patient does have history of liver fibrosis, prostate cancer history of hepatitis C, alcohol abuse.  Patient does not recall any injury to his left foot or ankle but has noticed significant progressive swelling and pain requiring him to use a cane.  Hip x-rays were obtained today after he fell and were negative for fracture performed on 11/08/2020.  No history of gout or other mixed  connective tissue diseases.  Review of Systems all other systems noncontributory to HPI.   Objective: Vital Signs: BP 116/75   Pulse (!) 101   Ht '5\' 8"'$  (1.727 m)   Wt 151 lb 4 oz (68.6 kg)   BMI 23.00 kg/m   Physical Exam Constitutional:      Appearance: He is well-developed.  HENT:     Head: Normocephalic and atraumatic.     Right Ear: External ear normal.     Left Ear: External ear normal.  Eyes:     Pupils: Pupils are equal, round, and reactive to light.  Neck:     Thyroid: No thyromegaly.     Trachea: No tracheal deviation.  Cardiovascular:     Rate and Rhythm: Normal rate.  Pulmonary:     Effort: Pulmonary effort is normal.     Breath sounds: No wheezing.  Abdominal:     General: Bowel sounds are normal.     Palpations: Abdomen is soft.  Musculoskeletal:     Cervical back: Neck supple.  Skin:    General: Skin is warm and dry.     Capillary Refill: Capillary refill takes less than 2 seconds.  Neurological:     Mental Status: He is alert and oriented to  person, place, and time.  Psychiatric:        Behavior: Behavior normal.        Thought Content: Thought content normal.        Judgment: Judgment normal.    Ortho Exam patient has diffuse swelling tenderness without erythema with tenderness over the midfoot forefoot medial lateral ankle.  Negative anterior drawer.  No ecchymosis no plantar foot lesions.  Opposite right foot shows no swelling no tenderness good range of motion.  Specialty Comments:  No specialty comments available.  Imaging: No results found.   PMFS History: Patient Active Problem List   Diagnosis Date Noted   Foot swelling 12/07/2020   Compression fracture of L1 lumbar vertebra ([place]) 12/05/2020   Cellulitis of left lower extremity 12/05/2020   Bilateral low back pain without sciatica 11/26/2020   Prostate cancer ([place]) 10/15/2020   Nocturia 05/14/2020   High risk heterosexual behavior 06/15/2019   Tobacco use 10/12/2018   Acid  reflux 07/25/2018   Rash and nonspecific skin eruption 03/06/2018   Liver fibrosis 02/13/2018   Tubular adenoma 12/26/2016   Left hip pain 11/26/2016   History of partial colectomy 05/18/2016   Erectile dysfunction 05/18/2016   Episodic recurrent vertigo 01/20/2016   Chronic hepatitis C without hepatic coma ([place]) 06/23/2015   Essential hypertension 04/12/2015   Arthritis of knee 04/12/2015   Alcohol abuse 04/12/2015   Past Medical History:  Diagnosis Date   Arthritis    Chronic hepatitis C ([place])    Hepatic cysts 12/01/2013   Seen on CT   Hypertension    Renal cysts 12/01/2013   Seen on CT    Family History  Problem Relation Age of Onset   Cancer Maternal Aunt     Past Surgical History:  Procedure Laterality Date   COLON SURGERY Right 09/2010   benign neoplasm of colon, s/p R colectomy   LYMPHADENECTOMY Bilateral 10/15/2020   Procedure: LYMPHADENECTOMY;  Surgeon: Alexis Frock, MD;  Location: [facility];  Service: Urology;  Laterality: Bilateral;   REPLACEMENT TOTAL KNEE Left 07/06/2014   Left knee replacement   ROBOT ASSISTED LAPAROSCOPIC RADICAL PROSTATECTOMY N/A 10/15/2020   Procedure: XI ROBOTIC ASSISTED LAPAROSCOPIC RADICAL PROSTATECTOMY, INDOCYANINE GREEN DYE INJECTION AND ADHESIOLYSIS;  Surgeon: Alexis Frock, MD;  Location: [facility];  Service: Urology;  Laterality: N/A;  3 HRS   Social History   Occupational History   Not on file  Tobacco Use   Smoking status: Every Day    Types: Cigarettes   Smokeless tobacco: Never  Vaping Use   Vaping Use: Never used  Substance and Sexual Activity   Alcohol use: Yes    Alcohol/week: 42.0 standard drinks    Types: 42 Standard drinks or equivalent per week    Comment: 3-4 vodka drinks a day   Drug use: No   Sexual activity: Yes    Partners: Female

## 2020-12-03 NOTE — Progress Notes (Signed)
R ankl

## 2020-12-03 NOTE — Progress Notes (Deleted)
   SUBJECTIVE:   CHIEF COMPLAINT / HPI:   Chief Complaint  Patient presents with   Back Pain     Jeffrey [name] is a 70 y.o. male here for ***   Pt reports ***    PERTINENT  PMH / PSH: reviewed and updated as appropriate   OBJECTIVE:   BP 110/70   Pulse (!) 106   Ht '5\' 8"'$  (1.727 m)   Wt 151 lb 4 oz (68.6 kg)   SpO2 99%   BMI 23.00 kg/m   ***   ASSESSMENT/PLAN:   No problem-specific Assessment & Plan notes found for this encounter.     Lyndee Hensen, DO PGY-3, [facility] 12/03/2020      {    This will disappear when note is signed, click to select method of visit    :1}

## 2020-12-03 NOTE — Patient Instructions (Addendum)
Thank you for coming into the office today. As dicussed for your foot, continue taking your pain medication. Be sure to stop by your pharmacy to pick up your antibiotics. If you have worsening foot pain or new fever, call us at [phone].   For you back pain, be sure to follow up with [facility] this afternoon as scheduled. Do not take Ibuprofen, Advil, Alleve, Naprosyn for the next 6-8 hours. For pain take Tylenol.   Take Care,   Dr. Susa Simmonds

## 2020-12-05 ENCOUNTER — Encounter: Payer: Self-pay | Admitting: Family Medicine

## 2020-12-05 DIAGNOSIS — L03116 Cellulitis of left lower limb: Secondary | ICD-10-CM | POA: Insufficient documentation

## 2020-12-05 DIAGNOSIS — S32010A Wedge compression fracture of first lumbar vertebra, initial encounter for closed fracture: Secondary | ICD-10-CM | POA: Insufficient documentation

## 2020-12-05 NOTE — Assessment & Plan Note (Signed)
Left foot cellulitis. Treat with Keflex TID for 10 days. Doubt gout as it is atypical location. Return precautions given. Defer need for imaging to ortho as pt has appt today.

## 2020-12-05 NOTE — Assessment & Plan Note (Signed)
Toradol 30 mg injection today. Pt to take Tylenol 1300 mg BID PRN. Limit NSAID use 2/2 to age. Patient to follow up with [facility] today at 2:15 PM. Consider intranasal calcitonin(200u). Pt may need bone density screening for osteoporosis if compression fx.

## 2020-12-06 ENCOUNTER — Other Ambulatory Visit (HOSPITAL_COMMUNITY): Payer: Self-pay

## 2020-12-06 MED FILL — Amlodipine Besylate Tab 10 MG (Base Equivalent): ORAL | 90 days supply | Qty: 90 | Fill #0 | Status: AC

## 2020-12-07 DIAGNOSIS — M7989 Other specified soft tissue disorders: Secondary | ICD-10-CM | POA: Insufficient documentation

## 2020-12-13 ENCOUNTER — Telehealth: Payer: Self-pay | Admitting: Orthopaedic Surgery

## 2020-12-13 NOTE — Telephone Encounter (Signed)
Pt would like to speak with Dr.Yates whenever possible. It has something to do with his follow up appt.   CB [phone]

## 2020-12-13 NOTE — Telephone Encounter (Signed)
Please see below. Patient is requesting to speak with you.

## 2020-12-14 ENCOUNTER — Other Ambulatory Visit: Payer: Self-pay | Admitting: Orthopaedic Surgery

## 2020-12-22 NOTE — Telephone Encounter (Signed)
Holding as reminder

## 2021-01-03 NOTE — Telephone Encounter (Signed)
Appt 01/11/2021 at 1:15pm

## 2021-01-10 NOTE — Telephone Encounter (Signed)
I called patient and reminded him of appt tomorrow afternoon.

## 2021-01-11 ENCOUNTER — Other Ambulatory Visit: Payer: Self-pay

## 2021-01-11 ENCOUNTER — Encounter: Payer: Self-pay | Admitting: Orthopaedic Surgery

## 2021-01-11 ENCOUNTER — Ambulatory Visit (INDEPENDENT_AMBULATORY_CARE_PROVIDER_SITE_OTHER): Payer: Medicare Other | Admitting: Orthopaedic Surgery

## 2021-01-11 ENCOUNTER — Ambulatory Visit: Payer: Self-pay

## 2021-01-11 VITALS — BP 113/77 | HR 118 | Ht 67.0 in | Wt 150.0 lb

## 2021-01-11 DIAGNOSIS — S32010A Wedge compression fracture of first lumbar vertebra, initial encounter for closed fracture: Secondary | ICD-10-CM | POA: Diagnosis not present

## 2021-01-11 NOTE — Progress Notes (Signed)
Office Visit Note   Patient: Jeffrey [name]           Date of Birth: 01/20/1951           MRN: [id number] Visit Date: 01/11/2021              Requested by: Carollee Leitz, MD [address] [place],  [place] [address] PCP: Carollee Leitz, MD   Assessment & Plan: Visit Diagnoses:  1. Compression fracture of L1 vertebra, initial encounter ([place])     Plan: Patient's had significant increase in pain after initial improvement for the first month.Would recommend proceeding with MRI scan to make sure he does not have associated epidural hematoma or disc herniation with compression.  Follow-up after MRI scan.  Follow-Up Instructions: No follow-ups on file.   Orders:  Orders Placed This Encounter  Procedures   XR Lumbar Spine 2-3 Views   No orders of the defined types were placed in this encounter.     Procedures: No procedures performed   Clinical Data: No additional findings.   Subjective: Chief Complaint  Patient presents with   Lower Back - Follow-up, Fracture    Fall 11/07/2020    HPI 70 year old male had a fall 11/07/2020 with L1 compression fracture.  He got gradual improvement for a month but states in the last several weeks his pain is gotten significantly worse he is not able to sleep.  He states he has difficulty with breathing with sharp pain.  Pain radiates to the left side worse than right and he rates the pain as 10 out of 10.  He is taken Tylenol extra strength and states he puts him to sleep he has had to use a cane for ambulation.  No bowel bladder symptoms.  Difficulty walking.  Review of Systems all other systems updated unchanged.  Had positive history of hepatitis C and prostate cancer.  All other systems noncontributory.   Objective: Vital Signs: BP 113/77   Pulse (!) 118   Ht 5\' 7"  (1.702 m)   Wt 150 lb (68 kg)   BMI 23.49 kg/m   Physical Exam Constitutional:      Appearance: He is well-developed.  HENT:     Head: Normocephalic and atraumatic.      Right Ear: External ear normal.     Left Ear: External ear normal.  Eyes:     Pupils: Pupils are equal, round, and reactive to light.  Neck:     Thyroid: No thyromegaly.     Trachea: No tracheal deviation.  Cardiovascular:     Rate and Rhythm: Normal rate.  Pulmonary:     Effort: Pulmonary effort is normal.     Breath sounds: No wheezing.  Abdominal:     General: Bowel sounds are normal.     Palpations: Abdomen is soft.  Musculoskeletal:     Cervical back: Neck supple.  Skin:    General: Skin is warm and dry.     Capillary Refill: Capillary refill takes less than 2 seconds.  Neurological:     Mental Status: He is alert and oriented to person, place, and time.  Psychiatric:        Behavior: Behavior normal.        Thought Content: Thought content normal.        Judgment: Judgment normal.    Ortho Exam ankle dorsiflexion plantarflexion is intact.  He has sharp pain with forward flexion.  Pain radiates around just below the ribs worse on the left than  right side.  Specialty Comments:  No specialty comments available.  Imaging: No results found.   PMFS History: Patient Active Problem List   Diagnosis Date Noted   Foot swelling 12/07/2020   Compression fracture of L1 lumbar vertebra ([place]) 12/05/2020   Cellulitis of left lower extremity 12/05/2020   Bilateral low back pain without sciatica 11/26/2020   Prostate cancer ([place]) 10/15/2020   Nocturia 05/14/2020   High risk heterosexual behavior 06/15/2019   Tobacco use 10/12/2018   Acid reflux 07/25/2018   Rash and nonspecific skin eruption 03/06/2018   Liver fibrosis 02/13/2018   Tubular adenoma 12/26/2016   Left hip pain 11/26/2016   History of partial colectomy 05/18/2016   Erectile dysfunction 05/18/2016   Episodic recurrent vertigo 01/20/2016   Chronic hepatitis C without hepatic coma ([place]) 06/23/2015   Essential hypertension 04/12/2015   Arthritis of knee 04/12/2015   Alcohol abuse 04/12/2015   Past Medical  History:  Diagnosis Date   Arthritis    Chronic hepatitis C ([place])    Hepatic cysts 12/01/2013   Seen on CT   Hypertension    Renal cysts 12/01/2013   Seen on CT    Family History  Problem Relation Age of Onset   Cancer Maternal Aunt     Past Surgical History:  Procedure Laterality Date   COLON SURGERY Right 09/2010   benign neoplasm of colon, s/p R colectomy   LYMPHADENECTOMY Bilateral 10/15/2020   Procedure: LYMPHADENECTOMY;  Surgeon: Alexis Frock, MD;  [facility];  Service: Urology;  Laterality: Bilateral;   REPLACEMENT TOTAL KNEE Left 07/06/2014   Left knee replacement   ROBOT ASSISTED LAPAROSCOPIC RADICAL PROSTATECTOMY N/A 10/15/2020   Procedure: XI ROBOTIC ASSISTED LAPAROSCOPIC RADICAL PROSTATECTOMY, INDOCYANINE GREEN DYE INJECTION AND ADHESIOLYSIS;  Surgeon: Alexis Frock, MD;  Location: [facility];  Service: Urology;  Laterality: N/A;  3 HRS   Social History   Occupational History   Not on file  Tobacco Use   Smoking status: Every Day    Types: Cigarettes   Smokeless tobacco: Never  Vaping Use   Vaping Use: Never used  Substance and Sexual Activity   Alcohol use: Yes    Alcohol/week: 42.0 standard drinks    Types: 42 Standard drinks or equivalent per week    Comment: 3-4 vodka drinks a day   Drug use: No   Sexual activity: Yes    Partners: Female

## 2021-01-21 ENCOUNTER — Ambulatory Visit
Admission: RE | Admit: 2021-01-21 | Discharge: 2021-01-21 | Disposition: A | Discharge status: Home / Self Care | Payer: Medicare Other | Source: Ambulatory Visit | Attending: Orthopaedic Surgery | Admitting: Orthopaedic Surgery

## 2021-01-21 DIAGNOSIS — S32010A Wedge compression fracture of first lumbar vertebra, initial encounter for closed fracture: Secondary | ICD-10-CM

## 2021-01-23 ENCOUNTER — Inpatient Hospital Stay (HOSPITAL_COMMUNITY)
Admission: EM | Admit: 2021-01-23 | Discharge: 2021-01-28 | DRG: 540 | Disposition: A | Discharge status: Home Health | Payer: Medicare Other | Attending: Family Medicine | Admitting: Family Medicine

## 2021-01-23 ENCOUNTER — Telehealth: Payer: Self-pay | Admitting: Orthopaedic Surgery

## 2021-01-23 DIAGNOSIS — G5793 Unspecified mononeuropathy of bilateral lower limbs: Secondary | ICD-10-CM

## 2021-01-23 DIAGNOSIS — Z9079 Acquired absence of other genital organ(s): Secondary | ICD-10-CM

## 2021-01-23 DIAGNOSIS — M5116 Intervertebral disc disorders with radiculopathy, lumbar region: Secondary | ICD-10-CM | POA: Diagnosis present

## 2021-01-23 DIAGNOSIS — L299 Pruritus, unspecified: Secondary | ICD-10-CM | POA: Diagnosis present

## 2021-01-23 DIAGNOSIS — M4854XA Collapsed vertebra, not elsewhere classified, thoracic region, initial encounter for fracture: Secondary | ICD-10-CM | POA: Diagnosis present

## 2021-01-23 DIAGNOSIS — M4856XA Collapsed vertebra, not elsewhere classified, lumbar region, initial encounter for fracture: Secondary | ICD-10-CM | POA: Diagnosis present

## 2021-01-23 DIAGNOSIS — M4805 Spinal stenosis, thoracolumbar region: Secondary | ICD-10-CM | POA: Diagnosis present

## 2021-01-23 DIAGNOSIS — F1010 Alcohol abuse, uncomplicated: Secondary | ICD-10-CM | POA: Diagnosis present

## 2021-01-23 DIAGNOSIS — M869 Osteomyelitis, unspecified: Secondary | ICD-10-CM

## 2021-01-23 DIAGNOSIS — Z79899 Other long term (current) drug therapy: Secondary | ICD-10-CM

## 2021-01-23 DIAGNOSIS — M4804 Spinal stenosis, thoracic region: Secondary | ICD-10-CM | POA: Diagnosis present

## 2021-01-23 DIAGNOSIS — S32010A Wedge compression fracture of first lumbar vertebra, initial encounter for closed fracture: Secondary | ICD-10-CM | POA: Diagnosis present

## 2021-01-23 DIAGNOSIS — M4626 Osteomyelitis of vertebra, lumbar region: Secondary | ICD-10-CM | POA: Diagnosis present

## 2021-01-23 DIAGNOSIS — G629 Polyneuropathy, unspecified: Secondary | ICD-10-CM | POA: Diagnosis present

## 2021-01-23 DIAGNOSIS — F17210 Nicotine dependence, cigarettes, uncomplicated: Secondary | ICD-10-CM | POA: Diagnosis present

## 2021-01-23 DIAGNOSIS — K5900 Constipation, unspecified: Secondary | ICD-10-CM | POA: Diagnosis not present

## 2021-01-23 DIAGNOSIS — Z96652 Presence of left artificial knee joint: Secondary | ICD-10-CM | POA: Diagnosis present

## 2021-01-23 DIAGNOSIS — N281 Cyst of kidney, acquired: Secondary | ICD-10-CM | POA: Diagnosis present

## 2021-01-23 DIAGNOSIS — Z20822 Contact with and (suspected) exposure to covid-19: Secondary | ICD-10-CM | POA: Diagnosis present

## 2021-01-23 DIAGNOSIS — D649 Anemia, unspecified: Secondary | ICD-10-CM | POA: Diagnosis present

## 2021-01-23 DIAGNOSIS — B182 Chronic viral hepatitis C: Secondary | ICD-10-CM | POA: Diagnosis present

## 2021-01-23 DIAGNOSIS — A419 Sepsis, unspecified organism: Secondary | ICD-10-CM | POA: Diagnosis present

## 2021-01-23 DIAGNOSIS — E871 Hypo-osmolality and hyponatremia: Secondary | ICD-10-CM | POA: Diagnosis present

## 2021-01-23 DIAGNOSIS — Z8546 Personal history of malignant neoplasm of prostate: Secondary | ICD-10-CM

## 2021-01-23 DIAGNOSIS — Z9049 Acquired absence of other specified parts of digestive tract: Secondary | ICD-10-CM

## 2021-01-23 DIAGNOSIS — M4624 Osteomyelitis of vertebra, thoracic region: Secondary | ICD-10-CM | POA: Diagnosis present

## 2021-01-23 DIAGNOSIS — M4625 Osteomyelitis of vertebra, thoracolumbar region: Secondary | ICD-10-CM | POA: Diagnosis not present

## 2021-01-23 DIAGNOSIS — I10 Essential (primary) hypertension: Secondary | ICD-10-CM | POA: Diagnosis present

## 2021-01-23 DIAGNOSIS — Z809 Family history of malignant neoplasm, unspecified: Secondary | ICD-10-CM

## 2021-01-23 DIAGNOSIS — E785 Hyperlipidemia, unspecified: Secondary | ICD-10-CM | POA: Diagnosis present

## 2021-01-23 DIAGNOSIS — R109 Unspecified abdominal pain: Secondary | ICD-10-CM

## 2021-01-23 DIAGNOSIS — M48061 Spinal stenosis, lumbar region without neurogenic claudication: Secondary | ICD-10-CM | POA: Diagnosis present

## 2021-01-23 DIAGNOSIS — E882 Lipomatosis, not elsewhere classified: Secondary | ICD-10-CM | POA: Diagnosis present

## 2021-01-23 LAB — CBC WITH DIFFERENTIAL/PLATELET
Abs Immature Granulocytes: 0.05 10*3/uL (ref 0.00–0.07)
Basophils Absolute: 0 10*3/uL (ref 0.0–0.1)
Basophils Relative: 0 %
Eosinophils Absolute: 0 10*3/uL (ref 0.0–0.5)
Eosinophils Relative: 0 %
HCT: 33.7 % — ABNORMAL LOW (ref 39.0–52.0)
Hemoglobin: 10.9 g/dL — ABNORMAL LOW (ref 13.0–17.0)
Immature Granulocytes: 0 %
Lymphocytes Relative: 6 %
Lymphs Abs: 0.9 10*3/uL (ref 0.7–4.0)
MCH: 30.4 pg (ref 26.0–34.0)
MCHC: 32.3 g/dL (ref 30.0–36.0)
MCV: 93.9 fL (ref 80.0–100.0)
Monocytes Absolute: 0.8 10*3/uL (ref 0.1–1.0)
Monocytes Relative: 5 %
Neutro Abs: 13.9 10*3/uL — ABNORMAL HIGH (ref 1.7–7.7)
Neutrophils Relative %: 89 %
Platelets: UNDETERMINED 10*3/uL (ref 150–400)
RBC: 3.59 MIL/uL — ABNORMAL LOW (ref 4.22–5.81)
RDW: 15.2 % (ref 11.5–15.5)
Smear Review: UNDETERMINED
WBC: 15.8 10*3/uL — ABNORMAL HIGH (ref 4.0–10.5)
nRBC: 0 % (ref 0.0–0.2)

## 2021-01-23 LAB — COMPREHENSIVE METABOLIC PANEL
ALT: 6 U/L (ref 0–44)
AST: 14 U/L — ABNORMAL LOW (ref 15–41)
Albumin: 3.4 g/dL — ABNORMAL LOW (ref 3.5–5.0)
Alkaline Phosphatase: 118 U/L (ref 38–126)
Anion gap: 14 (ref 5–15)
BUN: 7 mg/dL — ABNORMAL LOW (ref 8–23)
CO2: 19 mmol/L — ABNORMAL LOW (ref 22–32)
Calcium: 10 mg/dL (ref 8.9–10.3)
Chloride: 97 mmol/L — ABNORMAL LOW (ref 98–111)
Creatinine, Ser: 0.93 mg/dL (ref 0.61–1.24)
GFR, Estimated: >60 mL/min (ref >60)
Glucose, Bld: 95 mg/dL (ref 70–99)
Potassium: 3.6 mmol/L (ref 3.5–5.1)
Sodium: 130 mmol/L — ABNORMAL LOW (ref 135–145)
Total Bilirubin: 0.4 mg/dL (ref 0.3–1.2)
Total Protein: 8.4 g/dL — ABNORMAL HIGH (ref 6.5–8.1)

## 2021-01-23 LAB — RESP PANEL BY RT-PCR (FLU A&B, COVID) ARPGX2
Influenza A by PCR: NEGATIVE
Influenza B by PCR: NEGATIVE
SARS Coronavirus 2 by RT PCR: NEGATIVE

## 2021-01-23 LAB — LACTIC ACID, PLASMA
Lactic Acid, Venous: 2.4 mmol/L (ref 0.5–1.9)
Lactic Acid, Venous: 1.5 mmol/L (ref 0.5–1.9)

## 2021-01-23 MED ORDER — OXYCODONE-ACETAMINOPHEN 5-325 MG PO TABS
1.0000 | ORAL_TABLET | Freq: Once | ORAL | Status: DC
  Filled 2021-01-23: qty 1

## 2021-01-23 MED ORDER — ONDANSETRON HCL 4 MG/2ML IJ SOLN
4.0000 mg | Freq: Once | INTRAMUSCULAR | Status: AC
  Administered 2021-01-23: 4 mg via INTRAVENOUS
  Filled 2021-01-23: qty 2

## 2021-01-23 MED ORDER — VANCOMYCIN HCL 750 MG/150ML IV SOLN
750.0000 mg | Freq: Two times a day (BID) | INTRAVENOUS | Status: DC
  Administered 2021-01-24 – 2021-01-26 (×5): 750 mg via INTRAVENOUS
  Filled 2021-01-23 (×6): qty 150

## 2021-01-23 MED ORDER — VANCOMYCIN HCL 1500 MG/300ML IV SOLN
1500.0000 mg | Freq: Once | INTRAVENOUS | Status: DC
Start: 2021-01-23 — End: 2021-01-24
  Administered 2021-01-24: 1500 mg via INTRAVENOUS
  Filled 2021-01-23 (×2): qty 300

## 2021-01-23 MED ORDER — HYDROMORPHONE HCL 1 MG/ML IJ SOLN
1.0000 mg | Freq: Once | INTRAMUSCULAR | Status: AC
  Administered 2021-01-23: 1 mg via INTRAVENOUS
  Filled 2021-01-23: qty 1

## 2021-01-23 MED ORDER — SODIUM CHLORIDE 0.9 % IV SOLN
2.0000 g | Freq: Once | INTRAVENOUS | Status: AC
  Administered 2021-01-23: 2 g via INTRAVENOUS
  Filled 2021-01-23: qty 2

## 2021-01-23 MED ORDER — LACTATED RINGERS IV BOLUS
1000.0000 mL | Freq: Once | INTRAVENOUS | Status: AC
  Administered 2021-01-23: 1000 mL via INTRAVENOUS

## 2021-01-23 NOTE — ED Triage Notes (Signed)
Pt here from PCP [facility]) for further eval of back pain x63mos, concern for osteomylitis on T9-T10, fracture of L1. Advised to come to [facility] for admission to receive IV abx.

## 2021-01-23 NOTE — Progress Notes (Signed)
Pharmacy Antibiotic Note  Jeffrey [name] is a 70 y.o. male admitted on 01/23/2021 presenting with lower back pain, outpt MRI with osteo/discitis (T9-10, T12-L1).  Pharmacy has been consulted for vancomycin dosing.  Plan: Vancomycin 1500 mg IV x 1, then 750 mg IV q 12h (target vancomycin trough 15-20) Monitor renal function,  Vancomycin trough at steady state       Temp (24hrs), Avg:99.3 F (37.4 C), Min:99.3 F (37.4 C), Max:99.3 F (37.4 C)  Recent Labs  Lab 01/23/21 2020  WBC 15.8*  CREATININE 0.93  LATICACIDVEN 2.4*    Estimated Creatinine Clearance: 71.1 mL/min (by C-G formula based on SCr of 0.93 mg/dL).    No Known Allergies  Bertis Ruddy, PharmD Clinical Pharmacist ED Pharmacist Phone # [phone] 01/23/2021 9:19 PM

## 2021-01-23 NOTE — ED Provider Notes (Signed)
Emergency Medicine Provider Triage Evaluation Note  Jeffrey [name] , a 70 y.o. male  was evaluated in triage.  Pt complains of low back pain for the past 2 months.  Patient was sent over from PCP office for admission with possible T12-L1 osteomyelitis.   Review of Systems  Positive: Back pain Negative: fever  Physical Exam  BP (!) 142/104 (BP Location: Right Arm)   Pulse (!) 115   Temp 99.3 F (37.4 C) (Oral)   Resp 16   SpO2 99%  Gen:   Awake, no distress   Resp:  Normal effort  MSK:   Moves extremities without difficulty  Other:    Medical Decision Making  Medically screening exam initiated at 7:04 PM.  Appropriate orders placed.  Khair Chasteen was informed that the remainder of the evaluation will be completed by another provider, this initial triage assessment does not replace that evaluation, and the importance of remaining in the ED until their evaluation is complete.  Note from Dr. Gregary Signs shows osteo and discitis at T9-10 . Needs IV ABX hospitialist , no surgery needed . I will see him Monday.   He had a compression Fx at L1 which is stable. Previous prostate surgery .  Likely will need PICC line and IV ABX . I called pt and he had  been called by PA Sharman Crate and told to go to ER so he can be admitted by Hospitalist.   [place] 11/07/20 when in his kitchen . Hx Prostate CA, Hepatitis C, Etoh abuse, liver fibrosis.   Blood cultures, CMP, CBC, ESR/CRP ordered   Sherrell Puller, PA-C 01/23/21 1934    Varney Biles, MD 01/24/21 [phone]

## 2021-01-23 NOTE — ED Provider Notes (Signed)
[facility] EMERGENCY DEPARTMENT Provider Note   CSN: [id number] Arrival date & time: 01/23/21  1842     History Chief Complaint  Patient presents with   Back Pain    Jeffrey [name] is a 70 y.o. male.  Pt with hx lumbar compression fx 10/2020, pain improved, but in past 1-2 months, progressive mid to lower back pain, constant, dull, severe, worse w standing/walking, occasionally radiating downwards. Denies numbness/weakness. No problems with normal bowel/bladder function. Has felt as if fever at times. Denies recent trauma to back. Has seen ortho for same, had outpatient mri 9/30 concerning for osteomyelitis/disciitis, and was told to come to ER for admission.   The history is provided by the patient and medical records.  Back Pain Associated symptoms: fever   Associated symptoms: no abdominal pain, no chest pain, no headaches, no numbness and no weakness       Past Medical History:  Diagnosis Date   Arthritis    Chronic hepatitis C ([place])    Hepatic cysts 12/01/2013   Seen on CT   Hypertension    Renal cysts 12/01/2013   Seen on CT    Patient Active Problem List   Diagnosis Date Noted   Foot swelling 12/07/2020   Compression fracture of L1 lumbar vertebra ([place]) 12/05/2020   Cellulitis of left lower extremity 12/05/2020   Bilateral low back pain without sciatica 11/26/2020   Prostate cancer ([place]) 10/15/2020   Nocturia 05/14/2020   High risk heterosexual behavior 06/15/2019   Tobacco use 10/12/2018   Acid reflux 07/25/2018   Rash and nonspecific skin eruption 03/06/2018   Liver fibrosis 02/13/2018   Tubular adenoma 12/26/2016   Left hip pain 11/26/2016   History of partial colectomy 05/18/2016   Erectile dysfunction 05/18/2016   Episodic recurrent vertigo 01/20/2016   Chronic hepatitis C without hepatic coma ([place]) 06/23/2015   Essential hypertension 04/12/2015   Arthritis of knee 04/12/2015   Alcohol abuse 04/12/2015    Past Surgical History:   Procedure Laterality Date   COLON SURGERY Right 09/2010   benign neoplasm of colon, s/p R colectomy   LYMPHADENECTOMY Bilateral 10/15/2020   Procedure: LYMPHADENECTOMY;  Surgeon: Alexis Frock, MD;  Location: [facility];  Service: Urology;  Laterality: Bilateral;   REPLACEMENT TOTAL KNEE Left 07/06/2014   Left knee replacement   ROBOT ASSISTED LAPAROSCOPIC RADICAL PROSTATECTOMY N/A 10/15/2020   Procedure: XI ROBOTIC ASSISTED LAPAROSCOPIC RADICAL PROSTATECTOMY, INDOCYANINE GREEN DYE INJECTION AND ADHESIOLYSIS;  Surgeon: Alexis Frock, MD;  Location: [facility];  Service: Urology;  Laterality: N/A;  3 HRS       Family History  Problem Relation Age of Onset   Cancer Maternal Aunt     Social History   Tobacco Use   Smoking status: Every Day    Types: Cigarettes   Smokeless tobacco: Never  Vaping Use   Vaping Use: Never used  Substance Use Topics   Alcohol use: Yes    Alcohol/week: 42.0 standard drinks    Types: 42 Standard drinks or equivalent per week    Comment: 3-4 vodka drinks a day   Drug use: No    Home Medications Prior to Admission medications   Medication Sig Start Date End Date Taking? Authorizing Provider  amLODipine (NORVASC) 10 MG tablet TAKE 1 TABLET (10 MG TOTAL) BY MOUTH AT BEDTIME. Patient taking differently: Take 10 mg by mouth at bedtime. 05/17/20 05/17/21  Benay Pike, MD  losartan (COZAAR) 100 MG tablet TAKE 1 TABLET (100 MG TOTAL)  BY MOUTH AT BEDTIME. Patient taking differently: Take 100 mg by mouth at bedtime. 05/17/20 05/17/21  Benay Pike, MD  meclizine (ANTIVERT) 25 MG tablet Take 1 tablet (25 mg total) by mouth daily as needed. 05/16/19   Benay Pike, MD  naproxen (NAPROSYN) 500 MG tablet Take 1 tablet (500 mg total) by mouth 2 (two) times daily with a meal. 11/25/20   Gifford Shave, MD    Allergies    Patient has no known allergies.  Review of Systems   Review of Systems  Constitutional:  Positive for fever.  HENT:  Negative for sore  throat.   Eyes:  Negative for redness.  Respiratory:  Negative for cough and shortness of breath.   Cardiovascular:  Negative for chest pain.  Gastrointestinal:  Negative for abdominal pain.  Genitourinary:  Negative for flank pain.  Musculoskeletal:  Positive for back pain. Negative for neck pain.  Skin:  Negative for rash.  Neurological:  Negative for weakness, numbness and headaches.  Hematological:  Does not bruise/bleed easily.  Psychiatric/Behavioral:  Negative for confusion.    Physical Exam Updated Vital Signs BP (!) 142/104 (BP Location: Right Arm)   Pulse 94   Temp 99.3 F (37.4 C) (Oral)   Resp 19   SpO2 100%   Physical Exam Vitals and nursing note reviewed.  Constitutional:      Appearance: Normal appearance. He is well-developed.  HENT:     Head: Atraumatic.     Nose: Nose normal.     Mouth/Throat:     Mouth: Mucous membranes are moist.     Pharynx: Oropharynx is clear.  Eyes:     General: No scleral icterus.    Conjunctiva/sclera: Conjunctivae normal.     Pupils: Pupils are equal, round, and reactive to light.  Neck:     Trachea: No tracheal deviation.  Cardiovascular:     Rate and Rhythm: Normal rate and regular rhythm.     Pulses: Normal pulses.     Heart sounds: Normal heart sounds. No murmur heard.   No friction rub. No gallop.  Pulmonary:     Effort: Pulmonary effort is normal. No accessory muscle usage or respiratory distress.     Breath sounds: Normal breath sounds.  Abdominal:     General: Bowel sounds are normal. There is no distension.     Palpations: Abdomen is soft.     Tenderness: There is no abdominal tenderness. There is no guarding.  Genitourinary:    Comments: No cva tenderness. Musculoskeletal:        General: No swelling.     Cervical back: Normal range of motion and neck supple. No rigidity.     Comments: CTLS spine, non tender, aligned, no step off.   Skin:    General: Skin is warm and dry.     Findings: No rash.   Neurological:     Mental Status: He is alert.     Comments: Alert, speech clear. Motor intact bil, stre 5/5. Sens grossly intact bil.   Psychiatric:        Mood and Affect: Mood normal.    ED Results / Procedures / Treatments   Labs (all labs ordered are listed, but only abnormal results are displayed) Results for orders placed or performed during the hospital encounter of 01/23/21  Resp Panel by RT-PCR (Flu A&B, Covid) Nasopharyngeal Swab   Specimen: Nasopharyngeal Swab; Nasopharyngeal(NP) swabs in vial transport medium  Result Value Ref Range   SARS Coronavirus 2 by  RT PCR NEGATIVE NEGATIVE   Influenza A by PCR NEGATIVE NEGATIVE   Influenza B by PCR NEGATIVE NEGATIVE   MR Lumbar Spine w/o contrast  Addendum Date: 01/23/2021   ADDENDUM REPORT: 01/23/2021 10:52 ADDENDUM: These results were called by telephone at the time of interpretation on 01/23/2021 at 10:47 am to provider Sudie Bailey PA , who verbally acknowledged these results. Electronically Signed   By: Franchot Gallo M.D.   On: 01/23/2021 10:52   Result Date: 01/23/2021 CLINICAL DATA:  Low back pain.  History of prostate cancer. EXAM: MRI LUMBAR SPINE WITHOUT CONTRAST TECHNIQUE: Multiplanar, multisequence MR imaging of the lumbar spine was performed. No intravenous contrast was administered. COMPARISON:  CT abdomen pelvis 09/30/2020. Lumbar radiographs 01/11/2021 FINDINGS: Segmentation:  Normal Alignment:  Normal Vertebrae: Bone marrow edema throughout the T9 and T10 vertebral bodies. There is increased signal in the disc space. There is erosion of the endplates at T9 and F02 with significant loss of height of T9 and mild loss of height of T10. Findings compatible with disc space infection. Findings compatible with disc space infection also at T12-L1 with diffuse bone marrow edema and endplate erosion. There is moderate loss of height of L1 vertebral body. Mild retropulsion of L1 into the canal. Conus medullaris and cauda equina:  Conus extends to the L2 level. Conus and cauda equina appear normal. Paraspinal and other soft tissues: Negative for paraspinous mass or abscess. Axial images not obtained through the T9-10 level. Disc levels: T12-L1: Findings compatible with discitis and osteomyelitis. Retropulsion of L1 into the spinal canal causing moderate spinal stenosis. L1-2: Mild degenerative change.  Negative for stenosis L2-3: Mild disc and facet degeneration. Epidural lipomatosis. Mild spinal stenosis L3-4: Diffuse disc bulging and moderate facet hypertrophy. Prominent epidural lipomatosis. Moderate spinal stenosis L4-5: Disc bulging and moderate facet degeneration. Prominent epidural lipomatosis. Moderate spinal stenosis L5-S1: Bilateral facet degeneration. Prominent epidural lipomatosis. IMPRESSION: 1. Findings compatible with discitis and osteomyelitis at T9-10. Endplate erosion and fracture of T9 and T10. This was not present on the prior CT of 09/30/2020. Axial images not obtained through this level. Recommend further evaluation with MRI thoracic spine without and with contrast. 2. Findings compatible with discitis and osteomyelitis at T12-L1 with fracture of L1. Retropulsion of L1 into the canal contributing to moderate spinal stenosis. No abscess identified. Recommend postcontrast imaging lumbar spine at the time of the thoracic MRI. Findings not present on the prior CT abdomen pelvis 09/30/2020. 3. Prominent epidural lipomatosis in the lumbar spine with multilevel spinal stenosis. Electronically Signed: By: Franchot Gallo M.D. On: 01/23/2021 10:06   XR Lumbar Spine 2-3 Views  Result Date: 01/12/2021 2 view x-rays lumbar spine AP and lateral obtained and reviewed this shows L1 compression fracture unchanged from 11/26/2020 images.  No bone retropulsion. Impression: Stable L1 compression fracture.   EKG None  Radiology No results found.  Procedures Procedures   Medications Ordered in ED Medications   oxyCODONE-acetaminophen (PERCOCET/ROXICET) 5-325 MG per tablet 1 tablet (has no administration in time range)    ED Course  I have reviewed the triage vital signs and the nursing notes.  Pertinent labs & imaging results that were available during my care of the patient were reviewed by me and considered in my medical decision making (see chart for details).    MDM Rules/Calculators/A&P                          Iv ns. Labs and cultures.  Reviewed nursing notes and prior charts for additional history.  Recent mri reviewed/interpreted by me - concerning for possible disciitis/osteo - radiology recommends MRI t spine - will order.   Labs reviewed/interpreted by me - covid neg.   After cultures sent. Iv abx.   Dilaudid iv mg iv for pain.   Medicine service consulted for admission.  Pt s FP patient - FP called for admission.      Final Clinical Impression(s) / ED Diagnoses Final diagnoses:  None    Rx / DC Orders ED Discharge Orders     None        Lajean Saver, MD 01/23/21 2342

## 2021-01-23 NOTE — H&P (Addendum)
[facility] Admission History and Physical Service Pager: [phone]  Patient name: Jeffrey [name] Medical record number: [id number] Date of birth: 12/31/1950 Age: 70 y.o. Gender: male  Primary Care Provider: Carollee Leitz, MD Consultants: Orthopedic surgery Code Status: Full Code  Preferred Emergency Contact: Sayge Salvato, [phone], daughter   Judy Pimple, [phone], Friend  Chief Complaint: Back pain x 2 months  Assessment and Plan: Jeffrey [name] is a 70 y.o. male presenting for admission for IV antibiotics for vertebral osteomyelitis and discitis with back pain for 2 months. PMH is significant for hypertension, history of prostate cancer s/p radical prostatectomy (10/15/2020), chronic alcohol use disorder, cataracts, dyslipidemia, history of hepatitis C & hepatic cysts.  Vertebral osteomyelitis and discitis T9-T10, T12-L1 In setting of meeting 3/4 SIRS sepsis criteria The patient had an L1 compression fracture on 11/07/2020 after a fall.  He reports back pain since then.  He was seen by orthopedic surgery Dr. Rodell Perna on 01/11/2021 for this fracture.  The pain radiates to the left side worse than the right. He had been taking Tylenol Extra Strength. No bowel or bladder symptoms.  Has difficulty with walking due to pain and uses a cane for ambulation.  He had an MRI lumbar spine without contrast on 01/21/2021, with results returning today with findings of discitis and osteomyelitis at T9-T10, and T12-L1 with fracture of L1.  No abscess identified.  Dr. Lorin Mercy encouraged patient to come directly to ED for admission for intravenous antibiotics, with likely PICC line placement for continued abx as outpatient. No surgical intervention at this time, per Ortho. Upon arrival to ED, the patient was afebrile, meeting 3/4 SIRS criteria with tachycardia to 115 bpm, tachypnea with respirations of 22/min.  Blood cultures were obtained.  WBC elevated to 15.8,  Lactic acid  elevated to 2.4.  In the ED, the patient received IV Dilaudid 1 mg x1, p.o. Percocet 5-325 x 1, 1 L bolus LR, IV Zofran 4 mg x 1.  He also received 1500 mg IV vancomycin at 150 mL/HR x1 and cefepime 2 g x 1. During my exam, he was well-appearing with reported adequate pain control. A potential source of infection for the osteomyelitis is direct inoculation from robotic assisted laparoscopic radical prostatectomy on 10/15/2020 for localized prostate cancer although no complications were noted and he tolerated the procedure well. Considered urinary source of infection as well given patient had indwelling foley that has been removed. Needle biopsy not necessary at this time as patient has both clinical and radiographic findings typical of vertebral osteomyelitis. Blood cultures pending. We will start appropriate empiric regimen of vancomycin and ceftriaxone for coverage of MRSA. Expect that he may require a minimum of 6 weeks of IV antibiotics. -Admit to progressive FPTS, attending Dr. Dorcas Mcmurray -Orthopedic surgery (Dr. Rodell Perna) following, appreciate their care and recommendations -Pending blood cultures x2  -Pending urinalysis and urine cultures -Start IV Vancomycin, dosage per pharmacy consult  -Start IV Ceftriaxone 2g q24 hours -IV NS @ 21ml/hr for 12 hours  -PO Oxycodone 10mg  q3 hoursPRN for moderate pain -Dilaudid 1mg  q 4 hours as needed for severe or breakthrough pain -Vital signs q2 hours for 12 hours  -Up with assistance -PT/OT evaluate and treat -AM CBC, CMP   Constipation Patient reports he has not had a bowel movement for the past 4 days.  He says that he has been taking "Tylenol extra strength" that has been causing his constipation, and upon further investigation he is associating this with the  5-pill prescription narcotic he was prescribed from Dr. Lorin Mercy.  On exam, his abdomen is distended and tender to palpation throughout with normal active bowel sounds.  We will get x-ray to rule  out any acute abdominal pathology including ileus or obstruction. -KUB -MiraLAX 17 g daily -Senna 1 tablet daily  Hypertension Chronic and stable.  Blood pressure 142/104 upon arrival.  Remained consistently elevated 140s-150s/100-120s. He did not take his blood pressure medications prior to presentation to the ED. Home medications include amlodipine 10 mg daily and losartan 100 mg daily. -Continue home losartan and amlodipine  -monitor BP with VS   Neuropathy in bilateral lower extremities Intermittent tingling in bilateral feet that feels "like pins and needles." He does not have a history of diabetes mellitus, unlikely to be a diabetic neuropathy. This could be due to vitamin deficiency in the setting of his alcohol use disorder. -Consider gabapentin to alleviate symptoms if persistent - AM Vitamin b12 level - AM Hgb A1c -Thiamine tablet 100 mg daily -Multivitamin tablet daily  Chronic hepatitis C, genotype 1 Positive hepatitis C in early 2000's.  No history of IV drug abuse, continues to deny this on admission.  In 2017, test result for hepatitis C RNA by PCR was positive.  He completed 8 weeks of [name] treatment.  He had work-up to include liver staging with elastography which showed no cirrhosis and only F2 to F3 fibrosis.  Today, AST 14 and ALT 6. -Avoid hepatotoxic medications  Alcohol use disorder Patient drinks 3-4 drinks daily with a total of 42 weekly.  No history of DT or withdrawal seizures.  Last drink at 1800 on 01/22/21. -CIWA protocol without Ativan for now  Tobacco use disorder Smokes ~8 cigarettes per day for decades.  -Nicotine patch  Hepatic and renal cysts Chronic.  Seen on CT imaging in 2015. Creatinine 0.93, BUN 7.  History of localized prostate cancer S/p robotic assisted laparoscopic radical prostatectomy on 10/15/2020 by Dr. Alexis Frock with urethral catheter on discharge for 1 week.   - patient to continue with outpatient urology follow up    History of dyslipidemia Patient has history of very high HDL and low LDL cholesterol.  He was evaluated for this by cardiology in 2021.  Believed that etiology is his longstanding alcohol abuse and the effects on the liver affecting cholesterol regulation.  No therapy or testing recommended at that time as he was not at high risk for heart disease. - no further intervention at this time   History of benign neoplasm of colon, s/p right colectomy Patient had an open right partial colectomy for polyps with primary anastomosis in June 2012 in [place].  Patient is supposed to have colonoscopies every 2 years. Last colonoscopy in 2018 with removal of sessile polyp.  - will recommend maintaining outpatient schedule   FEN/GI: Cardiac diet  Prophylaxis: Lovenox subcutaneous  Disposition: Progressive  History of Present Illness:  Jeffrey [name] is a 70 y.o. male presenting with back pain found to have osteomyelitis throughout his spine.   Patient reports he has had back pain for the last two months. He states that he fell in his home when the fall happened. He states that he would take 3-4 tylenols when he was experiencing back pain.  He states that he thinks vertigo may have contributed to his fall in which he fractured a vertebra in his back. He states that he was not drinking at that time. Patient has noticed that he has chills more frequently since  the fall. He reports having pain with standing in his abdomen and into his leg. He reports that he has not had a BM in the last four days. He states that he recently had a procedure to remove his prostate and has still been having leaking of urine. He follows with Dr. Tammi Klippel (appt scheduled for 01/24/21) at [facility]. He denies any abnormal color to his urine. States that he had a urinary catheter for one week and a half.  Patient reports ambulating with assistance of cane. He reports that he is able to complete most of his IADLs. Patient reports that he  currently lives alone.  He reports regular adherence to his amlodipine and losartan medications. He has not checked BP at home. He also takes meclizine as needed for vertigo, last dose reported in Sept.   He reports that 1.5 years ago, he had a colonoscopy.   He reports drinking alcohol  that includes a 6 pack of beer. During a week, he reports drinking three days per week. He reports that his last drink was yesterday afternoon around 1800. He denies any hx of withdrawal seizures. He denies previous hospitalization for withdrawal.   He states that he smokes ~8 cigarettes per day and reports smoking more on the days that he drinks. He states that he smoked more ~10 years ago. He reports that he usually smokes marijuana daily. He denies smoking crack or using IV drugs.   He reports being vaccinated for COVID. He denies taking influenza vaccines.   Review Of Systems: Per HPI with the following additions:   Review of Systems  Constitutional:  Positive for appetite change, chills and unexpected weight change.       Reports losing 20 lbs since July 2022  HENT:  Negative for rhinorrhea and sore throat.   Respiratory:  Negative for shortness of breath and wheezing.   Cardiovascular:  Negative for chest pain.  Gastrointestinal:  Positive for constipation. Negative for diarrhea, nausea and vomiting.  Genitourinary:  Negative for dysuria and hematuria.       Urinary leakage   Musculoskeletal:  Positive for back pain. Negative for myalgias.  Skin:  Negative for rash and wound.  Neurological:  Negative for headaches.    Patient Active Problem List   Diagnosis Date Noted   Osteomyelitis of thoracic spine ([place]) 01/23/2021   Foot swelling 12/07/2020   Compression fracture of L1 lumbar vertebra ([place]) 12/05/2020   Cellulitis of left lower extremity 12/05/2020   Bilateral low back pain without sciatica 11/26/2020   Prostate cancer ([place]) 10/15/2020   Nocturia 05/14/2020   High risk heterosexual  behavior 06/15/2019   Tobacco use 10/12/2018   Acid reflux 07/25/2018   Rash and nonspecific skin eruption 03/06/2018   Liver fibrosis 02/13/2018   Tubular adenoma 12/26/2016   Left hip pain 11/26/2016   History of partial colectomy 05/18/2016   Erectile dysfunction 05/18/2016   Episodic recurrent vertigo 01/20/2016   Chronic hepatitis C without hepatic coma ([place]) 06/23/2015   Essential hypertension 04/12/2015   Arthritis of knee 04/12/2015   Alcohol abuse 04/12/2015    Past Medical History: Past Medical History:  Diagnosis Date   Arthritis    Chronic hepatitis C ([place])    Hepatic cysts 12/01/2013   Seen on CT   Hypertension    Renal cysts 12/01/2013   Seen on CT    Past Surgical History: Past Surgical History:  Procedure Laterality Date   COLON SURGERY Right 09/2010   benign neoplasm of  colon, s/p R colectomy   LYMPHADENECTOMY Bilateral 10/15/2020   Procedure: LYMPHADENECTOMY;  Surgeon: Alexis Frock, MD;  Location: [facility];  Service: Urology;  Laterality: Bilateral;   REPLACEMENT TOTAL KNEE Left 07/06/2014   Left knee replacement   ROBOT ASSISTED LAPAROSCOPIC RADICAL PROSTATECTOMY N/A 10/15/2020   Procedure: XI ROBOTIC ASSISTED LAPAROSCOPIC RADICAL PROSTATECTOMY, INDOCYANINE GREEN DYE INJECTION AND ADHESIOLYSIS;  Surgeon: Alexis Frock, MD;  Location: [facility];  Service: Urology;  Laterality: N/A;  3 HRS    Social History: Social History   Tobacco Use   Smoking status: Every Day    Types: Cigarettes   Smokeless tobacco: Never  Vaping Use   Vaping Use: Never used  Substance Use Topics   Alcohol use: Yes    Alcohol/week: 42.0 standard drinks    Types: 42 Standard drinks or equivalent per week    Comment: 3-4 vodka drinks a day   Drug use: No    Family History: Family History  Problem Relation Age of Onset   Cancer Maternal Aunt     Allergies and Medications: No Known Allergies No current facility-administered medications on file prior to encounter.    Current Outpatient Medications on File Prior to Encounter  Medication Sig Dispense Refill   acetaminophen (TYLENOL) 500 MG tablet Take 1,000 mg by mouth every 6 (six) hours as needed for moderate pain.     losartan (COZAAR) 100 MG tablet TAKE 1 TABLET (100 MG TOTAL) BY MOUTH AT BEDTIME. (Patient taking differently: Take 100 mg by mouth daily.) 90 tablet 3   meclizine (ANTIVERT) 25 MG tablet Take 1 tablet (25 mg total) by mouth daily as needed. (Patient taking differently: Take 25 mg by mouth daily as needed for dizziness.) 30 tablet 3   amLODipine (NORVASC) 10 MG tablet TAKE 1 TABLET (10 MG TOTAL) BY MOUTH AT BEDTIME. (Patient taking differently: Take 10 mg by mouth daily.) 90 tablet 3   naproxen (NAPROSYN) 500 MG tablet Take 1 tablet (500 mg total) by mouth 2 (two) times daily with a meal. (Patient not taking: Reported on 01/23/2021) 30 tablet 0    Objective: BP (!) 149/98   Pulse 88   Temp 99.3 F (37.4 C) (Oral)   Resp 15   SpO2 98%   Exam: General: elderly male lying supine in bed in NAD  Eyes: EOMI, PERRLA, sclera anicteric, cloudy lenses ENTM: Edentulous upper teeth, poor dentition.  Mucous membranes moist. Neck: Supple, normal range of motion, no nuchal rigidity Cardiovascular: Regular rate and rhythm, no murmurs appreciated. Respiratory: Good aeration throughout lung fields, clear to auscultation in all fields.  No wheezing, crackles.  No respiratory distress.  Normal respiratory effort. Gastrointestinal: Midline vertical incision from prior colectomy.  Port site incisions from prostatectomy surgery.  Abdomen soft, distended.  Normoactive bowel sounds in all lung quadrants. MSK: No bony deformities.  Point tenderness in lumbar spine to palpation. Derm: Skin warm and dry. Neuro: No focal neurologic deficits.  Normal sensation in bilateral feet. Psych: Mood and affect normal.  Speech clear and fluent, but at times tangential.  Easily redirected.  Labs and Imaging: CBC BMET   Recent Labs  Lab 01/23/21 2020  WBC 15.8*  HGB 10.9*  HCT 33.7*  PLT PLATELET CLUMPS NOTED ON SMEAR, UNABLE TO ESTIMATE   Recent Labs  Lab 01/23/21 2020  NA 130*  K 3.6  CL 97*  CO2 19*  BUN 7*  CREATININE 0.93  GLUCOSE 95  CALCIUM 10.0      MRI Thoracic Spine:  acute osteomyelitis discitis at T9-10 and T12-L1, Associated epidural enhancement/phlegmon within the adjacent ventral epidural spaces at these levels without frank epidural abscess. Suspected involvement of the bilateral T9-10 facets with associated septic arthritis. Associated pathologic compression fracture of T9 with 40% height loss and 3 mm bony retropulsion, with additional pathologic compression fracture of L1 with 50% height loss and 5 mm bony retropulsion. Superimposed multilevel degenerative spondylosis, facet hypertrophy, and epidural lipomatosis. Resultant moderate diffuse spinal stenosis throughout the thoracic spine as above  KUB: no acute abnormalities    Orvis Brill, DO  01/23/21 2110  PGY-1, [place] Intern pager: [phone], text pages welcome  FPTS Upper-Level Resident Addendum   I have independently interviewed and examined the patient. I have discussed the above with Dr.Dameron and agree with the documented plan. My edits for correction/addition/clarification are included above. Please see any attending notes.   Eulis Foster, MD PGY-3, [facility] 01/24/2021 2:20 AM  FPTS Service pager: [phone] (text pages welcome through [facility])

## 2021-01-24 ENCOUNTER — Observation Stay (HOSPITAL_COMMUNITY): Payer: Medicare Other

## 2021-01-24 DIAGNOSIS — F1010 Alcohol abuse, uncomplicated: Secondary | ICD-10-CM | POA: Diagnosis present

## 2021-01-24 DIAGNOSIS — K5900 Constipation, unspecified: Secondary | ICD-10-CM | POA: Diagnosis present

## 2021-01-24 DIAGNOSIS — M4625 Osteomyelitis of vertebra, thoracolumbar region: Secondary | ICD-10-CM | POA: Diagnosis present

## 2021-01-24 DIAGNOSIS — Z8546 Personal history of malignant neoplasm of prostate: Secondary | ICD-10-CM | POA: Diagnosis not present

## 2021-01-24 DIAGNOSIS — A419 Sepsis, unspecified organism: Secondary | ICD-10-CM | POA: Diagnosis present

## 2021-01-24 DIAGNOSIS — M4626 Osteomyelitis of vertebra, lumbar region: Secondary | ICD-10-CM

## 2021-01-24 DIAGNOSIS — G629 Polyneuropathy, unspecified: Secondary | ICD-10-CM | POA: Diagnosis present

## 2021-01-24 DIAGNOSIS — S32010A Wedge compression fracture of first lumbar vertebra, initial encounter for closed fracture: Secondary | ICD-10-CM

## 2021-01-24 DIAGNOSIS — E871 Hypo-osmolality and hyponatremia: Secondary | ICD-10-CM | POA: Diagnosis present

## 2021-01-24 DIAGNOSIS — M869 Osteomyelitis, unspecified: Secondary | ICD-10-CM

## 2021-01-24 DIAGNOSIS — Z20822 Contact with and (suspected) exposure to covid-19: Secondary | ICD-10-CM | POA: Diagnosis present

## 2021-01-24 DIAGNOSIS — Z9079 Acquired absence of other genital organ(s): Secondary | ICD-10-CM | POA: Diagnosis not present

## 2021-01-24 DIAGNOSIS — I10 Essential (primary) hypertension: Secondary | ICD-10-CM | POA: Diagnosis present

## 2021-01-24 DIAGNOSIS — L299 Pruritus, unspecified: Secondary | ICD-10-CM | POA: Diagnosis present

## 2021-01-24 DIAGNOSIS — M48061 Spinal stenosis, lumbar region without neurogenic claudication: Secondary | ICD-10-CM | POA: Diagnosis present

## 2021-01-24 DIAGNOSIS — M4856XA Collapsed vertebra, not elsewhere classified, lumbar region, initial encounter for fracture: Secondary | ICD-10-CM | POA: Diagnosis present

## 2021-01-24 DIAGNOSIS — M4805 Spinal stenosis, thoracolumbar region: Secondary | ICD-10-CM | POA: Diagnosis present

## 2021-01-24 DIAGNOSIS — G5793 Unspecified mononeuropathy of bilateral lower limbs: Secondary | ICD-10-CM | POA: Diagnosis not present

## 2021-01-24 DIAGNOSIS — N281 Cyst of kidney, acquired: Secondary | ICD-10-CM | POA: Diagnosis present

## 2021-01-24 DIAGNOSIS — M4624 Osteomyelitis of vertebra, thoracic region: Secondary | ICD-10-CM

## 2021-01-24 DIAGNOSIS — Z9049 Acquired absence of other specified parts of digestive tract: Secondary | ICD-10-CM | POA: Diagnosis not present

## 2021-01-24 DIAGNOSIS — Z96652 Presence of left artificial knee joint: Secondary | ICD-10-CM | POA: Diagnosis present

## 2021-01-24 DIAGNOSIS — F17210 Nicotine dependence, cigarettes, uncomplicated: Secondary | ICD-10-CM | POA: Diagnosis present

## 2021-01-24 DIAGNOSIS — B182 Chronic viral hepatitis C: Secondary | ICD-10-CM | POA: Diagnosis present

## 2021-01-24 DIAGNOSIS — D649 Anemia, unspecified: Secondary | ICD-10-CM | POA: Diagnosis present

## 2021-01-24 DIAGNOSIS — M5116 Intervertebral disc disorders with radiculopathy, lumbar region: Secondary | ICD-10-CM | POA: Diagnosis present

## 2021-01-24 DIAGNOSIS — M4854XA Collapsed vertebra, not elsewhere classified, thoracic region, initial encounter for fracture: Secondary | ICD-10-CM | POA: Diagnosis present

## 2021-01-24 DIAGNOSIS — E785 Hyperlipidemia, unspecified: Secondary | ICD-10-CM | POA: Diagnosis present

## 2021-01-24 LAB — URINALYSIS, ROUTINE W REFLEX MICROSCOPIC
Bilirubin Urine: NEGATIVE
Glucose, UA: NEGATIVE mg/dL
Ketones, ur: 20 mg/dL — AB
Nitrite: POSITIVE — AB
Protein, ur: 30 mg/dL — AB
Specific Gravity, Urine: 1.02 (ref 1.005–1.030)
WBC, UA: >50 WBC/hpf — ABNORMAL HIGH (ref 0–5)
pH: 5 (ref 5.0–8.0)

## 2021-01-24 LAB — CBC WITH DIFFERENTIAL/PLATELET
Abs Immature Granulocytes: 0.03 10*3/uL (ref 0.00–0.07)
Basophils Absolute: 0 10*3/uL (ref 0.0–0.1)
Basophils Relative: 0 %
Eosinophils Absolute: 0.1 10*3/uL (ref 0.0–0.5)
Eosinophils Relative: 1 %
HCT: 30.1 % — ABNORMAL LOW (ref 39.0–52.0)
Hemoglobin: 9.7 g/dL — ABNORMAL LOW (ref 13.0–17.0)
Immature Granulocytes: 0 %
Lymphocytes Relative: 15 %
Lymphs Abs: 1.5 10*3/uL (ref 0.7–4.0)
MCH: 30.5 pg (ref 26.0–34.0)
MCHC: 32.2 g/dL (ref 30.0–36.0)
MCV: 94.7 fL (ref 80.0–100.0)
Monocytes Absolute: 1 10*3/uL (ref 0.1–1.0)
Monocytes Relative: 10 %
Neutro Abs: 7.3 10*3/uL (ref 1.7–7.7)
Neutrophils Relative %: 74 %
Platelets: 398 10*3/uL (ref 150–400)
RBC: 3.18 MIL/uL — ABNORMAL LOW (ref 4.22–5.81)
RDW: 15.1 % (ref 11.5–15.5)
WBC: 9.9 10*3/uL (ref 4.0–10.5)
nRBC: 0 % (ref 0.0–0.2)

## 2021-01-24 LAB — COMPREHENSIVE METABOLIC PANEL
ALT: 6 U/L (ref 0–44)
AST: 10 U/L — ABNORMAL LOW (ref 15–41)
Albumin: 2.9 g/dL — ABNORMAL LOW (ref 3.5–5.0)
Alkaline Phosphatase: 115 U/L (ref 38–126)
Anion gap: 8 (ref 5–15)
BUN: 8 mg/dL (ref 8–23)
CO2: 21 mmol/L — ABNORMAL LOW (ref 22–32)
Calcium: 9.5 mg/dL (ref 8.9–10.3)
Chloride: 100 mmol/L (ref 98–111)
Creatinine, Ser: 0.82 mg/dL (ref 0.61–1.24)
GFR, Estimated: >60 mL/min (ref >60)
Glucose, Bld: 101 mg/dL — ABNORMAL HIGH (ref 70–99)
Potassium: 3.6 mmol/L (ref 3.5–5.1)
Sodium: 129 mmol/L — ABNORMAL LOW (ref 135–145)
Total Bilirubin: 0.7 mg/dL (ref 0.3–1.2)
Total Protein: 7.3 g/dL (ref 6.5–8.1)

## 2021-01-24 LAB — HEMOGLOBIN A1C
Hgb A1c MFr Bld: 4.5 % — ABNORMAL LOW (ref 4.8–5.6)
Mean Plasma Glucose: 82.45 mg/dL

## 2021-01-24 LAB — HIV ANTIBODY (ROUTINE TESTING W REFLEX): HIV Screen 4th Generation wRfx: NONREACTIVE

## 2021-01-24 LAB — VITAMIN B12: Vitamin B-12: 241 pg/mL (ref 180–914)

## 2021-01-24 MED ORDER — SODIUM CHLORIDE 0.9 % IV SOLN: INTRAVENOUS | Status: AC

## 2021-01-24 MED ORDER — GABAPENTIN 100 MG PO CAPS
100.0000 mg | ORAL_CAPSULE | Freq: Three times a day (TID) | ORAL | Status: DC
  Administered 2021-01-24 – 2021-01-28 (×14): 100 mg via ORAL
  Filled 2021-01-24 (×14): qty 1

## 2021-01-24 MED ORDER — HYDROMORPHONE HCL 1 MG/ML IJ SOLN
0.5000 mg | INTRAMUSCULAR | Status: DC | PRN
  Administered 2021-01-24 – 2021-01-25 (×3): 0.5 mg via INTRAVENOUS
  Filled 2021-01-24: qty 1
  Filled 2021-01-24: qty 0.5
  Filled 2021-01-24: qty 1

## 2021-01-24 MED ORDER — MELATONIN 3 MG PO TABS
3.0000 mg | ORAL_TABLET | Freq: Every day | ORAL | Status: DC
  Administered 2021-01-24 – 2021-01-27 (×5): 3 mg via ORAL
  Filled 2021-01-24 (×5): qty 1

## 2021-01-24 MED ORDER — THIAMINE HCL 100 MG PO TABS
100.0000 mg | ORAL_TABLET | Freq: Every day | ORAL | Status: DC
  Administered 2021-01-24: 100 mg via ORAL
  Filled 2021-01-24: qty 1

## 2021-01-24 MED ORDER — ENOXAPARIN SODIUM 40 MG/0.4ML IJ SOSY
40.0000 mg | PREFILLED_SYRINGE | INTRAMUSCULAR | Status: DC
  Administered 2021-01-24 – 2021-01-28 (×5): 40 mg via SUBCUTANEOUS
  Filled 2021-01-24 (×5): qty 0.4

## 2021-01-24 MED ORDER — GADOBUTROL 1 MMOL/ML IV SOLN
7.0000 mL | Freq: Once | INTRAVENOUS | Status: AC | PRN
  Administered 2021-01-24: 7 mL via INTRAVENOUS

## 2021-01-24 MED ORDER — SENNA 8.6 MG PO TABS
1.0000 | ORAL_TABLET | Freq: Every day | ORAL | Status: DC
  Administered 2021-01-24 – 2021-01-25 (×2): 8.6 mg via ORAL
  Filled 2021-01-24 (×2): qty 1

## 2021-01-24 MED ORDER — SODIUM CHLORIDE 0.9 % IV SOLN
2.0000 g | INTRAVENOUS | Status: DC
  Administered 2021-01-24 – 2021-01-28 (×5): 2 g via INTRAVENOUS
  Filled 2021-01-24 (×6): qty 20

## 2021-01-24 MED ORDER — AMLODIPINE BESYLATE 10 MG PO TABS
10.0000 mg | ORAL_TABLET | Freq: Every day | ORAL | Status: DC
  Administered 2021-01-24 – 2021-01-28 (×5): 10 mg via ORAL
  Filled 2021-01-24 (×2): qty 1
  Filled 2021-01-24: qty 2
  Filled 2021-01-24 (×2): qty 1

## 2021-01-24 MED ORDER — NICOTINE 7 MG/24HR TD PT24
7.0000 mg | MEDICATED_PATCH | Freq: Every day | TRANSDERMAL | Status: DC
  Filled 2021-01-24 (×3): qty 1

## 2021-01-24 MED ORDER — ADULT MULTIVITAMIN W/MINERALS CH
1.0000 | ORAL_TABLET | Freq: Every day | ORAL | Status: DC
  Administered 2021-01-24 – 2021-01-28 (×5): 1 via ORAL
  Filled 2021-01-24 (×5): qty 1

## 2021-01-24 MED ORDER — POLYETHYLENE GLYCOL 3350 17 G PO PACK
17.0000 g | PACK | Freq: Every day | ORAL | Status: DC
  Administered 2021-01-24 – 2021-01-25 (×2): 17 g via ORAL
  Filled 2021-01-24 (×2): qty 1

## 2021-01-24 MED ORDER — OXYCODONE HCL 5 MG PO TABS
10.0000 mg | ORAL_TABLET | ORAL | Status: DC | PRN
  Administered 2021-01-24 – 2021-01-28 (×15): 10 mg via ORAL
  Filled 2021-01-24 (×15): qty 2

## 2021-01-24 MED ORDER — THIAMINE HCL 100 MG/ML IJ SOLN: 100.0000 mg | Freq: Every day | INTRAMUSCULAR | Status: DC

## 2021-01-24 MED ORDER — THIAMINE HCL 100 MG PO TABS
100.0000 mg | ORAL_TABLET | Freq: Every day | ORAL | Status: DC
  Administered 2021-01-25 – 2021-01-28 (×4): 100 mg via ORAL
  Filled 2021-01-24 (×4): qty 1

## 2021-01-24 MED ORDER — LOSARTAN POTASSIUM 50 MG PO TABS
100.0000 mg | ORAL_TABLET | Freq: Every day | ORAL | Status: DC
  Administered 2021-01-24 – 2021-01-28 (×5): 100 mg via ORAL
  Filled 2021-01-24 (×5): qty 2

## 2021-01-24 NOTE — Consult Note (Addendum)
Reason for Consult: Back pain and bilateral lower extremity radiculopathy Referring Physician: Emergency department  Jeffrey [name] is an 70 y.o. male.  HPI: Patient is being seen with complaints of worsening back pain and now bilateral lower extremity radiculopathy.  Patient has known history of L1 compression fracture that he suffered from a fall July 2022.  Last seen by Dr. Lorin Mercy in the clinic for this January 11, 2021 for worsening back pain and lumbar MRI scan was ordered.  That study showed:  CLINICAL DATA:  Low back pain.  History of prostate cancer.   EXAM: MRI LUMBAR SPINE WITHOUT CONTRAST   TECHNIQUE: Multiplanar, multisequence MR imaging of the lumbar spine was performed. No intravenous contrast was administered.   COMPARISON:  CT abdomen pelvis 09/30/2020. Lumbar radiographs 01/11/2021   FINDINGS: Segmentation:  Normal   Alignment:  Normal   Vertebrae: Bone marrow edema throughout the T9 and T10 vertebral bodies. There is increased signal in the disc space. There is erosion of the endplates at T9 and L93 with significant loss of height of T9 and mild loss of height of T10. Findings compatible with disc space infection.   Findings compatible with disc space infection also at T12-L1 with diffuse bone marrow edema and endplate erosion. There is moderate loss of height of L1 vertebral body. Mild retropulsion of L1 into the canal.   Conus medullaris and cauda equina: Conus extends to the L2 level. Conus and cauda equina appear normal.   Paraspinal and other soft tissues: Negative for paraspinous mass or abscess. Axial images not obtained through the T9-10 level.   Disc levels:   T12-L1: Findings compatible with discitis and osteomyelitis. Retropulsion of L1 into the spinal canal causing moderate spinal stenosis.   L1-2: Mild degenerative change.  Negative for stenosis   L2-3: Mild disc and facet degeneration. Epidural lipomatosis. Mild spinal stenosis    L3-4: Diffuse disc bulging and moderate facet hypertrophy. Prominent epidural lipomatosis. Moderate spinal stenosis   L4-5: Disc bulging and moderate facet degeneration. Prominent epidural lipomatosis. Moderate spinal stenosis   L5-S1: Bilateral facet degeneration. Prominent epidural lipomatosis.   IMPRESSION: 1. Findings compatible with discitis and osteomyelitis at T9-10. Endplate erosion and fracture of T9 and T10. This was not present on the prior CT of 09/30/2020. Axial images not obtained through this level. Recommend further evaluation with MRI thoracic spine without and with contrast. 2. Findings compatible with discitis and osteomyelitis at T12-L1 with fracture of L1. Retropulsion of L1 into the canal contributing to moderate spinal stenosis. No abscess identified. Recommend postcontrast imaging lumbar spine at the time of the thoracic MRI. Findings not present on the prior CT abdomen pelvis 09/30/2020. 3. Prominent epidural lipomatosis in the lumbar spine with multilevel spinal stenosis.   Electronically Signed: By: Franchot Gallo M.D. On: 01/23/2021 10:06  Dr. Lorin Mercy spoke with patient yesterday and patient was advised to go to the emergency department.  Patient had thoracic spine MRI today and this showed:  EXAM: MRI THORACIC WITHOUT AND WITH CONTRAST   TECHNIQUE: Multiplanar and multiecho pulse sequences of the thoracic spine were obtained without and with intravenous contrast.   CONTRAST:  68mL GADAVIST GADOBUTROL 1 MMOL/ML IV SOLN   COMPARISON:  MRI of the lumbar spine from 01/21/2021.   FINDINGS: Alignment:  Examination moderately degraded by motion artifact.   Mild exaggeration of the normal thoracic kyphosis. No significant listhesis.   Vertebrae: Abnormal edema and enhancement seen involving the T9 and T10 vertebral bodies as well as the intervening  T9-10 interspace. Associated disc space narrowing with endplate irregularity, erosion, and  enhancement. Findings consistent with acute osteomyelitis discitis. Extension to involve the posterior elements as well, with possible septic arthritis involving the T9-10 facets bilaterally. Associated pathologic fracture of T9 with up to approximately 40% height loss and trace 3 mm bony retropulsion. Mild height loss noted at the adjacent superior endplate of P29 as well. Associated epidural enhancement/phlegmon within the adjacent ventral epidural space at the level of T9-10 (series 11, image 9). No frank epidural abscess. Additionally, phlegmonous changes present within the paraspinous soft tissues adjacent to the T9-10 interspace without definite discrete abscess or drainable fluid collection.   Additional changes consistent with osteomyelitis discitis at T12-L1 again seen as well. Height loss at the L1 vertebral body suspected to reflect an associated pathologic fracture as well. Associated height loss measures up to approximately 50% with 5 mm bony retropulsion. Associated epidural enhancement/phlegmon at this level as well without frank epidural abscess. Mild paraspinous phlegmonous change without discrete soft tissue abscess or drainable fluid collection.   No other sites of acute infection within the thoracic spine. Vertebral body height otherwise maintained. Bone marrow signal intensity diffusely heterogeneous. No visible worrisome osseous lesions.   Cord: Signal intensity within the thoracic spinal cord is grossly within normal limits on this motion degraded exam. Diffuse prominence of the dorsal epidural fat throughout the thoracic spine noted.   Paraspinal and other soft tissues: Paraspinous phlegmonous change adjacent to the T9-10 and T12-L1 interspaces as above. Mild diffuse subcutaneous edema seen throughout the visualized mid and lower back, which could be related to overall volume status. Approximate 1 cm T2 hyperintense simple cyst partially visualize within the  left kidney. Multiple scattered probable cysts noted within the visualized liver, largest of which measures approximately 1 cm (series 6, image 23).   Disc levels:   T1-2: Degenerative intervertebral disc space narrowing with diffuse disc bulge and reactive endplate spurring. Bilateral facet hypertrophy. No significant stenosis.   T2-3: Degenerative intervertebral disc space narrowing with diffuse disc bulge and reactive endplate spurring. Bilateral facet hypertrophy. No significant spinal stenosis. Foramina remain patent.   T3-4: Degenerative intervertebral disc space narrowing with diffuse disc bulge and reactive endplate spurring. Bilateral facet hypertrophy with prominence of the dorsal epidural fat. Resultant mild spinal stenosis. Foramina remain patent.   T4-5: Disc desiccation without significant disc bulge. Bilateral facet hypertrophy with prominence of the dorsal epidural fat. Moderate spinal stenosis, largely due to epidural fat. Foramina remain patent.   T5-6: Negative interspace. Mild facet hypertrophy. Prominent dorsal epidural fat with associated moderate spinal stenosis. Foramina remain patent.   T6-7: Negative interspace. Mild facet hypertrophy. Prominent dorsal epidural fat with associated moderate spinal stenosis. Foramina remain patent.   T7-8: Negative interspace. Prominence of the dorsal epidural fat with associated moderate spinal stenosis. Foramina remain patent.   T8-9: Disc desiccation with minimal disc bulge. Prominence of the dorsal epidural fat. Resultant moderate spinal stenosis. Foramina remain patent.   T9-10: Changes consistent with osteomyelitis discitis with associated T9 pathologic compression fracture and 3 mm bony retropulsion. Mild phlegmon within the ventral epidural space. Superimposed facet hypertrophy with prominence of the dorsal epidural fat. Resultant moderate spinal stenosis with minimal cord flattening, but no definite cord  signal changes. Moderate bilateral foraminal narrowing.   T10-11: Negative interspace. Bilateral facet hypertrophy. Prominence of the epidural fat. No significant spinal stenosis. Foramina remain patent.   T11-12: Negative interspace. Bilateral facet hypertrophy with mild epidural lipomatosis. No significant spinal stenosis. Foramina  remain patent.   T12-L1: Changes consistent with osteomyelitis discitis. Pathologic L1 compression fracture with 5 mm bony retropulsion. Mild epidural phlegmon within the ventral epidural space. Bilateral facet hypertrophy with epidural lipomatosis. Resultant moderate spinal stenosis with mild cord flattening is relatively similar to previous. No definite cord signal changes. Mild bilateral foraminal narrowing.   IMPRESSION: 1. Changes consistent with acute osteomyelitis discitis at T9-10 and T12-L1 as above. Associated epidural enhancement/phlegmon within the adjacent ventral epidural spaces at these levels without frank epidural abscess. Suspected involvement of the bilateral T9-10 facets with associated septic arthritis. 2. Associated pathologic compression fracture of T9 with 40% height loss and 3 mm bony retropulsion, with additional pathologic compression fracture of L1 with 50% height loss and 5 mm bony retropulsion. Associated moderate spinal stenosis at these levels, similar to previous. 3. Superimposed multilevel degenerative spondylosis, facet hypertrophy, and epidural lipomatosis. Resultant moderate diffuse spinal stenosis throughout the thoracic spine as above.     Electronically Signed   By: Jeannine Boga M.D.   On: 01/24/2021 01:56  Patient states that since his last visit with Dr. Lorin Mercy in the clinic he has been having worsening back pain with radiation to both legs above the knees.  Pain worse with activity.  Also complains of sharp tingling type sensation in both feet intermittently.  No complaints of bowel or bladder  incontinence.  States that he has had a poor appetite.  Past Medical History:  Diagnosis Date   Arthritis    Chronic hepatitis C ([place])    Hepatic cysts 12/01/2013   Seen on CT   Hypertension    Renal cysts 12/01/2013   Seen on CT    Past Surgical History:  Procedure Laterality Date   COLON SURGERY Right 09/2010   benign neoplasm of colon, s/p R colectomy   LYMPHADENECTOMY Bilateral 10/15/2020   Procedure: LYMPHADENECTOMY;  Surgeon: Alexis Frock, MD;  Location: [facility];  Service: Urology;  Laterality: Bilateral;   REPLACEMENT TOTAL KNEE Left 07/06/2014   Left knee replacement   ROBOT ASSISTED LAPAROSCOPIC RADICAL PROSTATECTOMY N/A 10/15/2020   Procedure: XI ROBOTIC ASSISTED LAPAROSCOPIC RADICAL PROSTATECTOMY, INDOCYANINE GREEN DYE INJECTION AND ADHESIOLYSIS;  Surgeon: Alexis Frock, MD;  Location: [facility];  Service: Urology;  Laterality: N/A;  3 HRS    Family History  Problem Relation Age of Onset   Cancer Maternal Aunt     Social History:  reports that he has been smoking cigarettes. He has never used smokeless tobacco. He reports current alcohol use of about 42.0 standard drinks per week. He reports that he does not use drugs.  Allergies: No Known Allergies  Medications: I have reviewed the patient's current medications.  Results for orders placed or performed during the hospital encounter of 01/23/21 (from the past 48 hour(s))  Resp Panel by RT-PCR (Flu A&B, Covid) Nasopharyngeal Swab     Status: None   Collection Time: 01/23/21  7:40 PM   Specimen: Nasopharyngeal Swab; Nasopharyngeal(NP) swabs in vial transport medium  Result Value Ref Range   SARS Coronavirus 2 by RT PCR NEGATIVE NEGATIVE    Comment: (NOTE) SARS-CoV-2 target nucleic acids are NOT DETECTED.  The SARS-CoV-2 RNA is generally detectable in upper respiratory specimens during the acute phase of infection. The lowest concentration of SARS-CoV-2 viral copies this assay can detect is 138 copies/mL. A  negative result does not preclude SARS-Cov-2 infection and should not be used as the sole basis for treatment or other patient management decisions. A negative result may occur with  improper specimen collection/handling, submission of specimen other than nasopharyngeal swab, presence of viral mutation(s) within the areas targeted by this assay, and inadequate number of viral copies(<138 copies/mL). A negative result must be combined with clinical observations, patient history, and epidemiological information. The expected result is Negative.  Fact Sheet for Patients:  EntrepreneurPulse.com.au  Fact Sheet for Healthcare Providers:  IncredibleEmployment.be  This test is no t yet approved or cleared by the [place] FDA and  has been authorized for detection and/or diagnosis of SARS-CoV-2 by FDA under an Emergency Use Authorization (EUA). This EUA will remain  in effect (meaning this test can be used) for the duration of the COVID-19 declaration under Section 564(b)(1) of the Act, 21 U.S.C.section 360bbb-3(b)(1), unless the authorization is terminated  or revoked sooner.       Influenza A by PCR NEGATIVE NEGATIVE   Influenza B by PCR NEGATIVE NEGATIVE    Comment: (NOTE) The Xpert Xpress SARS-CoV-2/FLU/RSV plus assay is intended as an aid in the diagnosis of influenza from Nasopharyngeal swab specimens and should not be used as a sole basis for treatment. Nasal washings and aspirates are unacceptable for Xpert Xpress SARS-CoV-2/FLU/RSV testing.  Fact Sheet for Patients: EntrepreneurPulse.com.au  Fact Sheet for Healthcare Providers: IncredibleEmployment.be  This test is not yet approved or cleared by the [place] FDA and has been authorized for detection and/or diagnosis of SARS-CoV-2 by FDA under an Emergency Use Authorization (EUA). This EUA will remain in effect (meaning this test can be used)  for the duration of the COVID-19 declaration under Section 564(b)(1) of the Act, 21 U.S.C. section 360bbb-3(b)(1), unless the authorization is terminated or revoked.  Performed at [facility], [place] [address] [place], [place] [address]   Blood culture (routine x 2)     Status: None (Preliminary result)   Collection Time: 01/23/21  8:00 PM   Specimen: BLOOD LEFT HAND  Result Value Ref Range   Specimen Description BLOOD LEFT HAND    Special Requests      BOTTLES DRAWN AEROBIC AND ANAEROBIC Blood Culture results may not be optimal due to an inadequate volume of blood received in culture bottles   Culture      NO GROWTH < 12 HOURS Performed at [facility] [address] [place], [place] [address]    Report Status PENDING   CBC with Differential     Status: Abnormal   Collection Time: 01/23/21  8:20 PM  Result Value Ref Range   WBC 15.8 (H) 4.0 - 10.5 K/uL   RBC 3.59 (L) 4.22 - 5.81 MIL/uL   Hemoglobin 10.9 (L) 13.0 - 17.0 g/dL   HCT 33.7 (L) 39.0 - 52.0 %   MCV 93.9 80.0 - 100.0 fL   MCH 30.4 26.0 - 34.0 pg   MCHC 32.3 30.0 - 36.0 g/dL   RDW 15.2 11.5 - 15.5 %   Platelets PLATELET CLUMPS NOTED ON SMEAR, UNABLE TO ESTIMATE 150 - 400 K/uL   nRBC 0.0 0.0 - 0.2 %   Neutrophils Relative % 89 %   Neutro Abs 13.9 (H) 1.7 - 7.7 K/uL   Lymphocytes Relative 6 %   Lymphs Abs 0.9 0.7 - 4.0 K/uL   Monocytes Relative 5 %   Monocytes Absolute 0.8 0.1 - 1.0 K/uL   Eosinophils Relative 0 %   Eosinophils Absolute 0.0 0.0 - 0.5 K/uL   Basophils Relative 0 %   Basophils Absolute 0.0 0.0 - 0.1 K/uL   WBC Morphology MORPHOLOGY  UNREMARKABLE    RBC Morphology MORPHOLOGY UNREMARKABLE    Smear Review PLATELET CLUMPS NOTED ON SMEAR, UNABLE TO ESTIMATE    Immature Granulocytes 0 %   Abs Immature Granulocytes 0.05 0.00 - 0.07 K/uL    Comment: Performed at [facility] [address], [place], [place] [address]  Comprehensive metabolic panel     Status: Abnormal   Collection  Time: 01/23/21  8:20 PM  Result Value Ref Range   Sodium 130 (L) 135 - 145 mmol/L   Potassium 3.6 3.5 - 5.1 mmol/L   Chloride 97 (L) 98 - 111 mmol/L   CO2 19 (L) 22 - 32 mmol/L   Glucose, Bld 95 70 - 99 mg/dL    Comment: Glucose reference range applies only to samples taken after fasting for at least 8 hours.   BUN 7 (L) 8 - 23 mg/dL   Creatinine, Ser 0.93 0.61 - 1.24 mg/dL   Calcium 10.0 8.9 - 10.3 mg/dL   Total Protein 8.4 (H) 6.5 - 8.1 g/dL   Albumin 3.4 (L) 3.5 - 5.0 g/dL   AST 14 (L) 15 - 41 U/L   ALT 6 0 - 44 U/L   Alkaline Phosphatase 118 38 - 126 U/L   Total Bilirubin 0.4 0.3 - 1.2 mg/dL   GFR, Estimated >60 >60 mL/min    Comment: (NOTE) Calculated using the CKD-EPI Creatinine Equation (2021)    Anion gap 14 5 - 15    Comment: Performed at [facility], [place] [address], [place], [place] [address]  Lactic acid, plasma     Status: Abnormal   Collection Time: 01/23/21  8:20 PM  Result Value Ref Range   Lactic Acid, Venous 2.4 (HH) 0.5 - 1.9 mmol/L    Comment: CRITICAL RESULT CALLED TO, READ BACK BY AND VERIFIED WITH: Meyer Russel RN 01/23/21 2059 Wiliam Ke Performed at [facility], [place] [address] [place], [place] [address]   Lactic acid, plasma     Status: None   Collection Time: 01/23/21 10:00 PM  Result Value Ref Range   Lactic Acid, Venous 1.5 0.5 - 1.9 mmol/L    Comment: Performed at [facility] [address], [place], [place] [address]  HIV Antibody (routine testing w rflx)     Status: None   Collection Time: 01/24/21 12:00 AM  Result Value Ref Range   HIV Screen 4th Generation wRfx Non Reactive Non Reactive    Comment: Performed at [facility], [place] [address] [place],  [address]  CBC WITH DIFFERENTIAL     Status: Abnormal   Collection Time: 01/24/21  2:10 AM  Result Value Ref Range   WBC 9.9 4.0 - 10.5 K/uL   RBC 3.18 (L) 4.22 - 5.81 MIL/uL   Hemoglobin 9.7 (L) 13.0 - 17.0 g/dL   HCT 30.1 (L) 39.0 - 52.0 %   MCV 94.7  80.0 - 100.0 fL   MCH 30.5 26.0 - 34.0 pg   MCHC 32.2 30.0 - 36.0 g/dL   RDW 15.1 11.5 - 15.5 %   Platelets 398 150 - 400 K/uL   nRBC 0.0 0.0 - 0.2 %   Neutrophils Relative % 74 %   Neutro Abs 7.3 1.7 - 7.7 K/uL   Lymphocytes Relative 15 %   Lymphs Abs 1.5 0.7 - 4.0 K/uL   Monocytes Relative 10 %   Monocytes Absolute 1.0 0.1 - 1.0 K/uL   Eosinophils Relative 1 %   Eosinophils  Absolute 0.1 0.0 - 0.5 K/uL   Basophils Relative 0 %   Basophils Absolute 0.0 0.0 - 0.1 K/uL   Immature Granulocytes 0 %   Abs Immature Granulocytes 0.03 0.00 - 0.07 K/uL    Comment: Performed at [facility], [place] [address] [place], [place] [address]  Hemoglobin A1c     Status: Abnormal   Collection Time: 01/24/21  2:10 AM  Result Value Ref Range   Hgb A1c MFr Bld 4.5 (L) 4.8 - 5.6 %    Comment: (NOTE) Pre diabetes:          5.7%-6.4%  Diabetes:              >6.4%  Glycemic control for   <7.0% adults with diabetes    Mean Plasma Glucose 82.45 mg/dL    Comment: Performed at [facility] [address] [place], [place] [address]  Urinalysis, Routine w reflex microscopic Urine, Clean Catch     Status: Abnormal   Collection Time: 01/24/21  2:10 AM  Result Value Ref Range   Color, Urine YELLOW YELLOW   APPearance HAZY (A) CLEAR   Specific Gravity, Urine 1.020 1.005 - 1.030   pH 5.0 5.0 - 8.0   Glucose, UA NEGATIVE NEGATIVE mg/dL   Hgb urine dipstick SMALL (A) NEGATIVE   Bilirubin Urine NEGATIVE NEGATIVE   Ketones, ur 20 (A) NEGATIVE mg/dL   Protein, ur 30 (A) NEGATIVE mg/dL   Nitrite POSITIVE (A) NEGATIVE   Leukocytes,Ua MODERATE (A) NEGATIVE   RBC / HPF 0-5 0 - 5 RBC/hpf   WBC, UA >50 (H) 0 - 5 WBC/hpf   Bacteria, UA MANY (A) NONE SEEN   Squamous Epithelial / LPF 0-5 0 - 5   Mucus PRESENT     Comment: Performed at [facility], [place] [address] [place], [place] [address]  Comprehensive metabolic panel     Status: Abnormal   Collection Time: 01/24/21  2:10 AM  Result Value  Ref Range   Sodium 129 (L) 135 - 145 mmol/L   Potassium 3.6 3.5 - 5.1 mmol/L   Chloride 100 98 - 111 mmol/L   CO2 21 (L) 22 - 32 mmol/L   Glucose, Bld 101 (H) 70 - 99 mg/dL    Comment: Glucose reference range applies only to samples taken after fasting for at least 8 hours.   BUN 8 8 - 23 mg/dL   Creatinine, Ser 0.82 0.61 - 1.24 mg/dL   Calcium 9.5 8.9 - 10.3 mg/dL   Total Protein 7.3 6.5 - 8.1 g/dL   Albumin 2.9 (L) 3.5 - 5.0 g/dL   AST 10 (L) 15 - 41 U/L   ALT 6 0 - 44 U/L   Alkaline Phosphatase 115 38 - 126 U/L   Total Bilirubin 0.7 0.3 - 1.2 mg/dL   GFR, Estimated >60 >60 mL/min    Comment: (NOTE) Calculated using the CKD-EPI Creatinine Equation (2021)    Anion gap 8 5 - 15    Comment: Performed at [facility] [address], [place], [place] [address]  Vitamin B12     Status: None   Collection Time: 01/24/21  2:10 AM  Result Value Ref Range   Vitamin B-12 241 180 - 914 pg/mL    Comment: (NOTE) This assay is not validated for testing neonatal or myeloproliferative syndrome specimens for Vitamin B12 levels. Performed at [facility], [place] [address], [place], [place] [address]  MR THORACIC SPINE W WO CONTRAST  Result Date: 01/24/2021 CLINICAL DATA:  Initial evaluation for epidural abscess. EXAM: MRI THORACIC WITHOUT AND WITH CONTRAST TECHNIQUE: Multiplanar and multiecho pulse sequences of the thoracic spine were obtained without and with intravenous contrast. CONTRAST:  25mL GADAVIST GADOBUTROL 1 MMOL/ML IV SOLN COMPARISON:  MRI of the lumbar spine from 01/21/2021. FINDINGS: Alignment:  Examination moderately degraded by motion artifact. Mild exaggeration of the normal thoracic kyphosis. No significant listhesis. Vertebrae: Abnormal edema and enhancement seen involving the T9 and T10 vertebral bodies as well as the intervening T9-10 interspace. Associated disc space narrowing with endplate irregularity, erosion, and enhancement. Findings consistent with acute  osteomyelitis discitis. Extension to involve the posterior elements as well, with possible septic arthritis involving the T9-10 facets bilaterally. Associated pathologic fracture of T9 with up to approximately 40% height loss and trace 3 mm bony retropulsion. Mild height loss noted at the adjacent superior endplate of Q73 as well. Associated epidural enhancement/phlegmon within the adjacent ventral epidural space at the level of T9-10 (series 11, image 9). No frank epidural abscess. Additionally, phlegmonous changes present within the paraspinous soft tissues adjacent to the T9-10 interspace without definite discrete abscess or drainable fluid collection. Additional changes consistent with osteomyelitis discitis at T12-L1 again seen as well. Height loss at the L1 vertebral body suspected to reflect an associated pathologic fracture as well. Associated height loss measures up to approximately 50% with 5 mm bony retropulsion. Associated epidural enhancement/phlegmon at this level as well without frank epidural abscess. Mild paraspinous phlegmonous change without discrete soft tissue abscess or drainable fluid collection. No other sites of acute infection within the thoracic spine. Vertebral body height otherwise maintained. Bone marrow signal intensity diffusely heterogeneous. No visible worrisome osseous lesions. Cord: Signal intensity within the thoracic spinal cord is grossly within normal limits on this motion degraded exam. Diffuse prominence of the dorsal epidural fat throughout the thoracic spine noted. Paraspinal and other soft tissues: Paraspinous phlegmonous change adjacent to the T9-10 and T12-L1 interspaces as above. Mild diffuse subcutaneous edema seen throughout the visualized mid and lower back, which could be related to overall volume status. Approximate 1 cm T2 hyperintense simple cyst partially visualize within the left kidney. Multiple scattered probable cysts noted within the visualized liver,  largest of which measures approximately 1 cm (series 6, image 23). Disc levels: T1-2: Degenerative intervertebral disc space narrowing with diffuse disc bulge and reactive endplate spurring. Bilateral facet hypertrophy. No significant stenosis. T2-3: Degenerative intervertebral disc space narrowing with diffuse disc bulge and reactive endplate spurring. Bilateral facet hypertrophy. No significant spinal stenosis. Foramina remain patent. T3-4: Degenerative intervertebral disc space narrowing with diffuse disc bulge and reactive endplate spurring. Bilateral facet hypertrophy with prominence of the dorsal epidural fat. Resultant mild spinal stenosis. Foramina remain patent. T4-5: Disc desiccation without significant disc bulge. Bilateral facet hypertrophy with prominence of the dorsal epidural fat. Moderate spinal stenosis, largely due to epidural fat. Foramina remain patent. T5-6: Negative interspace. Mild facet hypertrophy. Prominent dorsal epidural fat with associated moderate spinal stenosis. Foramina remain patent. T6-7: Negative interspace. Mild facet hypertrophy. Prominent dorsal epidural fat with associated moderate spinal stenosis. Foramina remain patent. T7-8: Negative interspace. Prominence of the dorsal epidural fat with associated moderate spinal stenosis. Foramina remain patent. T8-9: Disc desiccation with minimal disc bulge. Prominence of the dorsal epidural fat. Resultant moderate spinal stenosis. Foramina remain patent. T9-10: Changes consistent with osteomyelitis discitis with associated T9 pathologic compression fracture and 3 mm bony retropulsion. Mild phlegmon within the  ventral epidural space. Superimposed facet hypertrophy with prominence of the dorsal epidural fat. Resultant moderate spinal stenosis with minimal cord flattening, but no definite cord signal changes. Moderate bilateral foraminal narrowing. T10-11: Negative interspace. Bilateral facet hypertrophy. Prominence of the epidural fat. No  significant spinal stenosis. Foramina remain patent. T11-12: Negative interspace. Bilateral facet hypertrophy with mild epidural lipomatosis. No significant spinal stenosis. Foramina remain patent. T12-L1: Changes consistent with osteomyelitis discitis. Pathologic L1 compression fracture with 5 mm bony retropulsion. Mild epidural phlegmon within the ventral epidural space. Bilateral facet hypertrophy with epidural lipomatosis. Resultant moderate spinal stenosis with mild cord flattening is relatively similar to previous. No definite cord signal changes. Mild bilateral foraminal narrowing. IMPRESSION: 1. Changes consistent with acute osteomyelitis discitis at T9-10 and T12-L1 as above. Associated epidural enhancement/phlegmon within the adjacent ventral epidural spaces at these levels without frank epidural abscess. Suspected involvement of the bilateral T9-10 facets with associated septic arthritis. 2. Associated pathologic compression fracture of T9 with 40% height loss and 3 mm bony retropulsion, with additional pathologic compression fracture of L1 with 50% height loss and 5 mm bony retropulsion. Associated moderate spinal stenosis at these levels, similar to previous. 3. Superimposed multilevel degenerative spondylosis, facet hypertrophy, and epidural lipomatosis. Resultant moderate diffuse spinal stenosis throughout the thoracic spine as above. Electronically Signed   By: Jeannine Boga M.D.   On: 01/24/2021 01:56   DG Abd 2 Views  Result Date: 01/24/2021 CLINICAL DATA:  Abdominal pain and constipation, initial encounter EXAM: ABDOMEN - 2 VIEW COMPARISON:  CT from 09/30/2020 FINDINGS: Cardiac shadow is within normal limits. Lungs are clear bilaterally. No acute bony abnormality in the chest is noted. Scattered large and small bowel gas is noted. No free intraperitoneal air is seen. No abnormal mass or abnormal calcifications are noted. No acute bony abnormality is seen. Degenerative changes of lumbar  spine are noted. IMPRESSION: No acute abnormality in the chest and abdomen. Electronically Signed   By: Inez Catalina M.D.   On: 01/24/2021 01:36    Review of Systems  Constitutional:  Positive for activity change and appetite change.  Musculoskeletal:  Positive for back pain.  Neurological:  Positive for numbness (Numbness and tingling).  Blood pressure (!) 156/100, pulse 81, temperature 98.2 F (36.8 C), temperature source Oral, resp. rate 13, SpO2 97 %. Physical Exam Constitutional:      General: He is not in acute distress. HENT:     Head: Normocephalic.  Eyes:     Extraocular Movements: Extraocular movements intact.  Pulmonary:     Effort: No respiratory distress.  Musculoskeletal:     Comments: Patient laying in bed.  Negative logroll bilateral hips.  Positive left straight leg raise.  Neurovascular intact.  No quad, hamstring, anterior tib or gastroc weakness bilaterally.  Neurological:     Mental Status: He is alert.    Assessment/Plan: 1)  acute osteomyelitis discitis at T9-10 and T12-L1  2) pathologic compression fracture of T9 with 40% height loss and 3 mm bony retropulsion, with additional pathologic compression fracture of L1 with 50% height loss and 5 mm bony retropulsion.   Patient already started on IV Vanco and ceftriaxone.  He will continue this.  Blood cultures pending.  Discussed condition with my attending Dr. Rodell Perna and he will see patient this afternoon. All questions answered.   Benjiman Core 01/24/2021, 1:49 PM   Addendum: Reviewed above exam findings and agree.  Discussed with patient and reviewed MRI findings with him including images.  Gastrocsoleus anterior  tib quads are strong.  He is feeling much better with less pain.  Comfortable in bed.  We will follow with you.  At this point no surgery is indicated.  If he develops some instability after treatment with antibiotics then surgery to be readdressed.  We will follow with you.  Likely will need PICC  line for home IV antibiotics with duration as recommended by infectious disease.

## 2021-01-24 NOTE — Hospital Course (Addendum)
Jeffrey [name] is a 70 year old male with a history of hypertension, prostate cancer s/p radical prostatectomy, chronic alcohol use disorder, cataracts, dyslipidemia, history of hepatitis C and hepatic cysts who was admitted to the [facility] for IV antibiotic treatment for acute vertebral osteomyelitis and discitis. Hospital course as outlined below:  Lumbar and thoracic spine osteomyelitis and discitis SEPSIS without acute organ dysfunction Compression fracture lumbar vertebrae Patient had L1 compression fracture on 11/07/2020 and presented with osteomyelitis after findings on MRI lumbar spine ordered outpatient by orthopedic surgery.  On arrival, patient was afebrile, tachycardic, tachypneic.  Patient had leukocytosis with WBC 15.8, lactic acid elevated to 2.4.  Received Dilaudid, Percocet, 1 L bolus LR, Zofran in the ED.  Blood cultures were negative at 24 hours and urine cultures showed Urine culture showed 100,000 colonies gram negative rods e coli but asymptomatic. Empiric antibiotic regimen started of vancomycin and ceftriaxone for coverage of MRSA. Orthopedic surgery consulted and did not recommend procedure a this time. They will follow up out patient. Infectious disease consulted and recommended long term course vancomycin 750 mg IV every 12 hours + ceftriaxone 2 grams IV every 24 hours for 6 weeks. With weekly labs (CBC with diff, CMP, CRP, ESR, vancomycin trough). PIC should be pulled at completion of the IV antibiotics. Received Oxycodone 10mg  q3 hours PO PRN for moderate pain and Dilaudid 1mg  q 4 hours PO PRN for severe or breakthrough pain. Sent home with oxycodone only. PT recommendations are home health PT, back brace, and walker.  Neuropathic painbilateral lower extremities secondary to compression fracture and osteomyelitis Patient said he has had intermittent tingling in bilateral feet.  No history of diabetes mellitus, unlikely to be a diabetic neuropathy  given Hgb A1c low at 4.5. Neuropathy could be due to vitamin deficiency in the setting of his alcohol use disorder or secondary to spinal stenosis in context of osteomyelitis. Folate in 2017 was normal at 16.2. Vitamin B12 level wnl. Gabapentin initiated and was helpful.   Chronic hyponatremia Chronic hyponatremia most likely secondary to alcohol use disorder. Sodium ranging from 127 to 138 since 2016. Today corrected sodium of 129.  Serum osmolality 274 mildly low to normal. Normal urine osmolality at 478. Urine sodium is 100. Slightly hypotonic hyponatremic with normal GFR and no use of thiazides, no peripheral edema, and no signs of hypovolemia. Most likely isoosmolar hyponatremia related to recent prostectomy. Could consider morning cortisol and ACTH stimulation test to further workup hypotonic hyponatremic. Since improving, will CTM for now.   Hypertension Has remained HTN but improving last BP 139/76, SBP (124-162) and DBP (76-109). Home medications include amlodipine 10 mg daily and losartan 100 mg daily. Given patient's age, target blood pressure will continue with current medications and continue to monitor. Hydroxyzine for pruritis may be beneficial for HTN. Increase in BP may be related to pain or alcohol withdrawal. Continued home Losartan 100 mg daily and Amlodipine 10 mg daily   Anemia and Thrombocytopenia  Hemoglobin down to 8.1 from 10.9 on admission. In June hemoglobin was 13.7. HCT down to 25.3 down from 33.7. Could be contributed to alcohol use disorder, past hep C, multiple lab draws, or potential side effect from Lovenox. Since, no signs or symptoms of anemia on physical exam or during the interview and above transfusion threshold, follow up outpatient. May need a blood smear or further workup.Transfusion threshold would be hemoglobin less than 7 because no known history of CAD.   Alcohol Use Disorder  CIWA  scores were 0 to 1. Per patient, no history of DT. Not interested in  cessation or further treatment at this time. Did not require ativan while hospitalized.   Issues to Follow Up:  Follow up outpatient appointment with Infectious Disease Dr. Scharlene Gloss on 10/28 at 10:30 am.  Follow up outpatient appointment with Orthopedic Surgery with Dr. Rodell Perna 3 weeks after discharge.  Follow up chronic medical issues including but not limited to hyponatremia, hypertension, apering opioid pain medications, and tapering gabapentin for radiculopathy as tolerated with outpatient PCP.  Home health for home IV antibiotics. PICC line removed post treatment.  Patient demonstrated anemia and thrombocytopenia while hospitalized. Will need to be followed up outpatient PCP. May need a blood smear/further work up.

## 2021-01-24 NOTE — Progress Notes (Addendum)
Family Medicine Teaching Service Daily Progress Note Intern Pager: [phone]  Patient name: Jeffrey [name] Medical record number: [id number] Date of birth: April 30, 1950 Age: 70 y.o. Gender: male  Primary Care Provider: Carollee Leitz, MD Consultants: Orthopedic Surgery  Code Status: Full Code  [name] PMH is significant for hypertension, history of prostate cancer s/p radical prostatectomy (10/15/2020), chronic alcohol use disorder, cataracts, dyslipidemia, history of hepatitis C & hepatic cysts.   Vertebral osteomyelitis and discitis T9-T10, T12-L1 In setting of meeting 3/4 SIRS sepsis criteria Potential source of osteomyelitis could be via cellulitis of the left lower extremity diagnosed in August 2022. Direct inoculation from robotic assisted laparoscopic radical prostatectomy on 10/15/2020 for localized prostate cancer is also a potential source, although no complications were noted and he tolerated the procedure well. Considered urinary source of infection as well given patient had indwelling foley that was removed. UA showed small glucose, moderation ketones, positive nitrites, and protein. Urine culture pending. No longer tachycardic with pulse (70-89), WBC normalized (15.8 to 9.9), and respiratory rate normalized (10-14). Lactic acid normalized (24. To 1.5). During my exam, patient's pain was well controlled.  - Orthopedic surgery (Dr. Rodell Perna) following, appreciate their care and recommendations - Follow up blood cultures results: no growth at 12 hours  - Continue IV Vancomycin, dosage per pharmacy consult  - Continue IV Ceftriaxone 2g q24 hours - Continue Oxycodone 10mg  q3 hours PO PRN for moderate  pain - Continue Dilaudid 1mg  q 4 hours PO PRN for severe or breakthrough pain - Vital signs q2 hours for 12 hours  - Up with assistance - PT/OT evaluate and treat - AM CBC, CMP - F/u HIV antibody    Constipation Patient reports he has not had a bowel movement for the past 4 days.  He says that he has been taking "Tylenol extra strength" that has been causing his constipation, and upon further investigation he is associating this with the 5-pill prescription narcotic he was prescribed from Dr. Lorin Mercy. On exam, stomach is mildly distended but no pain with palpation. 10/3: KUB showed no acute abnormality.  -MiraLAX 17 g daily -Senna 1 tablet daily  Hypertension Has remained HTN with most recent BP 166/109 and SBP ranging from 140-171 and DBP ranging from 89-139. He did not take his blood pressure medications prior to presentation to the ED. Home medications include amlodipine 10 mg daily and losartan 100 mg daily. - Continue home Losartan 100 mg daily and Amlodipine 10 mg daily  - Monitor BP with VS   Neuropathy in bilateral lower extremities Intermittent tingling in bilateral feet that feels "like pins and needles." Most likely due to cord compression. He does not have a history of diabetes mellitus, unlikely to be a diabetic neuropathy given Hgb A1c low at 4.5. Neuropathy could be due to vitamin deficiency in the setting of his alcohol use disorder. Folate in 2017 was normal at 16.2.10/3: Vitamin B12 level wnl.  - Continue Thiamine 100 mg PO daily - Continue Multivitamin tablet daily - Start Gabapentin 100 mg TID   Chronic hyponatremia Chronic hyponatremia ranging from 129 to 138 since 2016. Today sodium of 129. Negative for non specific symptoms of hyponatremia including headache, fatigue, nausea, vomiting, dizziness, forgetfulness, confusion, and  muscle cramps. Received 1 liter LR and 0.9% sodium chloride infusion, which were discontinued. Most likely due to alcohol use disorder.  - CTM    Chronic Hepatitis C, Genotype 1 Positive hepatitis C in early 2000's.  No history of IV drug abuse, continues to deny this on admission.  In 2017, test result for hepatitis C RNA by PCR was positive.  He completed 8 weeks of [name] treatment.  He had work-up to include liver staging with elastography which showed no cirrhosis and only F2 to F3 fibrosis.  Today, AST 10 and ALT 6. - Avoid hepatotoxic medications   Alcohol Use Disorder Patient drinks 3-4 drinks daily with a total of 42 weekly.  No history of DT or withdrawal seizures. Has had hallucinations in setting of withdrawal in the past.  Last drink at 1800 on 01/22/21. - CIWA protocol without Ativan for now   Tobacco use disorder Smokes ~8 cigarettes per day for decades.  -Nicotine patch 7 mg    Hepatic and renal cysts Chronic.  Seen on CT imaging in 2015. Creatinine 0.93, BUN 7.   History of localized prostate cancer S/p robotic assisted laparoscopic radical prostatectomy on 10/15/2020 by Dr. Alexis Frock with urethral catheter on discharge for 1 week.   - Patient to continue with outpatient urology follow up    History of dyslipidemia Patient has history of dyslipidemia. He was evaluated for this by cardiology in 2021.  Believed that etiology is his longstanding alcohol abuse and the effects on the liver affecting cholesterol regulation.  No therapy or testing recommended at that time as he was not at high risk for heart disease. Most recent lipid panel in 2021 wnl.  - No further intervention at this time    History of benign neoplasm of colon, s/p right colectomy Patient had an open right partial colectomy for polyps with primary anastomosis in June 2012 in [place].  Patient is supposed to have colonoscopies every 2 years. Last colonoscopy in 2018 with removal of sessile polyp.  - Will recommend maintaining outpatient schedule   Circadian Rhythm Regulation   Continue Melatonin 3 mg nightly.   Delirium Prevention  Protocol Although this patient is not currently delirious he is at greater risk given his age and deliriogenic medications. - RN to open blinds every morning - To bedside: glasses, hearing aide, patient's own shoes. Make available to patient's when possible and encourage use - RN to assess orientation to person, place and time qam and prn, with frequent reorientation and introduction of caregivers, with date written on whiteboard and clock in room. - Recommend extended visiting hours with familiar family/friends as feasible - Encourage normal sleep-wake cycle by promoting a dark, quiet environment at night and stimulating, light environment during the day. - Turn the TV off when patient is asleep or not in use. - Avoid deliriogenic medications (opiates, benzodiazepines, antihistamines), as medically able    FEN/GI: Cardiac Diet  PPx: Lovenox  Dispo:Pending PT recommendations  pending clinical improvement . Barriers include orthopedic recommendations.   Subjective:  Patient resting comfortably in bed. He has not slept well because of pain but feels his back pain is better managed after pain medicine this morning. Decreased appetite over the last couple months with patient reported weight loss. Has been constipated and usually takes several doses of Miralax. Says he has had bilateral pins and needles in his feet.   Objective: Temp:  [98.2 F (36.8 C)-99.3 F (37.4 C)] 98.2 F (36.8 C) (10/03 0204) Pulse  Rate:  [70-115] 78 (10/03 0500) Resp:  [10-22] 11 (10/03 0500) BP: (140-171)/(89-139) 149/102 (10/03 0500) SpO2:  [94 %-100 %] 96 % (10/03 0500) Physical Exam: Physical Exam  Gen: AOx3, in NAD HEENT: NCAT, no LAD, sclera anicteric, cloudy lenses, Edentulous upper teeth with one golden tooth, poor dentition. Mucous membranes moist. CV: RRR, no mgr, 2+ bilateral dorsalis pedis Lung: CTAB, normal WOB Abd: NT, mildly distended +BS Neuro: CN II-XII grossly intact, Aox4  Skin: warm, dry, no  rashes or open lesions noted   Laboratory: Recent Labs  Lab 01/23/21 2020 01/24/21 0210  WBC 15.8* 9.9  HGB 10.9* 9.7*  HCT 33.7* 30.1*  PLT PLATELET CLUMPS NOTED ON SMEAR, UNABLE TO ESTIMATE 398   Recent Labs  Lab 01/23/21 2020 01/24/21 0210  NA 130* 129*  K 3.6 3.6  CL 97* 100  CO2 19* 21*  BUN 7* 8  CREATININE 0.93 0.82  CALCIUM 10.0 9.5  PROT 8.4* 7.3  BILITOT 0.4 0.7  ALKPHOS 118 115  ALT 6 6  AST 14* 10*  GLUCOSE 95 101*    Imaging/Diagnostic Tests: MR THORACIC SPINE W WO CONTRAST  Result Date: 01/24/2021 CLINICAL DATA:  Initial evaluation for epidural abscess. EXAM: MRI THORACIC WITHOUT AND WITH CONTRAST TECHNIQUE: Multiplanar and multiecho pulse sequences of the thoracic spine were obtained without and with intravenous contrast. CONTRAST:  36mL GADAVIST GADOBUTROL 1 MMOL/ML IV SOLN COMPARISON:  MRI of the lumbar spine from 01/21/2021. FINDINGS: Alignment:  Examination moderately degraded by motion artifact. Mild exaggeration of the normal thoracic kyphosis. No significant listhesis. Vertebrae: Abnormal edema and enhancement seen involving the T9 and T10 vertebral bodies as well as the intervening T9-10 interspace. Associated disc space narrowing with endplate irregularity, erosion, and enhancement. Findings consistent with acute osteomyelitis discitis. Extension to involve the posterior elements as well, with possible septic arthritis involving the T9-10 facets bilaterally. Associated pathologic fracture of T9 with up to approximately 40% height loss and trace 3 mm bony retropulsion. Mild height loss noted at the adjacent superior endplate of K93 as well. Associated epidural enhancement/phlegmon within the adjacent ventral epidural space at the level of T9-10 (series 11, image 9). No frank epidural abscess. Additionally, phlegmonous changes present within the paraspinous soft tissues adjacent to the T9-10 interspace without definite discrete abscess or drainable fluid  collection. Additional changes consistent with osteomyelitis discitis at T12-L1 again seen as well. Height loss at the L1 vertebral body suspected to reflect an associated pathologic fracture as well. Associated height loss measures up to approximately 50% with 5 mm bony retropulsion. Associated epidural enhancement/phlegmon at this level as well without frank epidural abscess. Mild paraspinous phlegmonous change without discrete soft tissue abscess or drainable fluid collection. No other sites of acute infection within the thoracic spine. Vertebral body height otherwise maintained. Bone marrow signal intensity diffusely heterogeneous. No visible worrisome osseous lesions. Cord: Signal intensity within the thoracic spinal cord is grossly within normal limits on this motion degraded exam. Diffuse prominence of the dorsal epidural fat throughout the thoracic spine noted. Paraspinal and other soft tissues: Paraspinous phlegmonous change adjacent to the T9-10 and T12-L1 interspaces as above. Mild diffuse subcutaneous edema seen throughout the visualized mid and lower back, which could be related to overall volume status. Approximate 1 cm T2 hyperintense simple cyst partially visualize within the left kidney. Multiple scattered probable cysts noted within the visualized liver, largest of which measures approximately 1 cm (series 6, image 23). Disc levels: T1-2: Degenerative intervertebral disc space narrowing with diffuse  disc bulge and reactive endplate spurring. Bilateral facet hypertrophy. No significant stenosis. T2-3: Degenerative intervertebral disc space narrowing with diffuse disc bulge and reactive endplate spurring. Bilateral facet hypertrophy. No significant spinal stenosis. Foramina remain patent. T3-4: Degenerative intervertebral disc space narrowing with diffuse disc bulge and reactive endplate spurring. Bilateral facet hypertrophy with prominence of the dorsal epidural fat. Resultant mild spinal stenosis.  Foramina remain patent. T4-5: Disc desiccation without significant disc bulge. Bilateral facet hypertrophy with prominence of the dorsal epidural fat. Moderate spinal stenosis, largely due to epidural fat. Foramina remain patent. T5-6: Negative interspace. Mild facet hypertrophy. Prominent dorsal epidural fat with associated moderate spinal stenosis. Foramina remain patent. T6-7: Negative interspace. Mild facet hypertrophy. Prominent dorsal epidural fat with associated moderate spinal stenosis. Foramina remain patent. T7-8: Negative interspace. Prominence of the dorsal epidural fat with associated moderate spinal stenosis. Foramina remain patent. T8-9: Disc desiccation with minimal disc bulge. Prominence of the dorsal epidural fat. Resultant moderate spinal stenosis. Foramina remain patent. T9-10: Changes consistent with osteomyelitis discitis with associated T9 pathologic compression fracture and 3 mm bony retropulsion. Mild phlegmon within the ventral epidural space. Superimposed facet hypertrophy with prominence of the dorsal epidural fat. Resultant moderate spinal stenosis with minimal cord flattening, but no definite cord signal changes. Moderate bilateral foraminal narrowing. T10-11: Negative interspace. Bilateral facet hypertrophy. Prominence of the epidural fat. No significant spinal stenosis. Foramina remain patent. T11-12: Negative interspace. Bilateral facet hypertrophy with mild epidural lipomatosis. No significant spinal stenosis. Foramina remain patent. T12-L1: Changes consistent with osteomyelitis discitis. Pathologic L1 compression fracture with 5 mm bony retropulsion. Mild epidural phlegmon within the ventral epidural space. Bilateral facet hypertrophy with epidural lipomatosis. Resultant moderate spinal stenosis with mild cord flattening is relatively similar to previous. No definite cord signal changes. Mild bilateral foraminal narrowing. IMPRESSION: 1. Changes consistent with acute osteomyelitis  discitis at T9-10 and T12-L1 as above. Associated epidural enhancement/phlegmon within the adjacent ventral epidural spaces at these levels without frank epidural abscess. Suspected involvement of the bilateral T9-10 facets with associated septic arthritis. 2. Associated pathologic compression fracture of T9 with 40% height loss and 3 mm bony retropulsion, with additional pathologic compression fracture of L1 with 50% height loss and 5 mm bony retropulsion. Associated moderate spinal stenosis at these levels, similar to previous. 3. Superimposed multilevel degenerative spondylosis, facet hypertrophy, and epidural lipomatosis. Resultant moderate diffuse spinal stenosis throughout the thoracic spine as above. Electronically Signed   By: Jeannine Boga M.D.   On: 01/24/2021 01:56   DG Abd 2 Views  Result Date: 01/24/2021 CLINICAL DATA:  Abdominal pain and constipation, initial encounter EXAM: ABDOMEN - 2 VIEW COMPARISON:  CT from 09/30/2020 FINDINGS: Cardiac shadow is within normal limits. Lungs are clear bilaterally. No acute bony abnormality in the chest is noted. Scattered large and small bowel gas is noted. No free intraperitoneal air is seen. No abnormal mass or abnormal calcifications are noted. No acute bony abnormality is seen. Degenerative changes of lumbar spine are noted. IMPRESSION: No acute abnormality in the chest and abdomen. Electronically Signed   By: Inez Catalina M.D.   On: 01/24/2021 01:36     Silver Huguenin, Medical Student 01/24/2021, 7:33 AM [facility] Intern pager: [phone], text pages welcome   I was personally present and re-performed the exam and medical decision making and verified the service and findings are accurately documented in the student's note.  Alcus Dad, MD 01/24/2021 12:16 PM

## 2021-01-24 NOTE — Progress Notes (Signed)
FPTS Interim Progress Note  Spoke with Silvestre Gunner at 13:40-13:43 regarding the placed orthopedic consult for this patient. He will reach out to Dr. Rodell Perna, with [facility], to check if the patient is on their list to be seen. We appreciate their recommendations and assistance with the care of this patient.   Silver Huguenin, Medical Student 01/24/2021, 1:43 PM [place] Pager [phone]

## 2021-01-25 ENCOUNTER — Ambulatory Visit: Payer: Medicare Other | Admitting: Orthopaedic Surgery

## 2021-01-25 ENCOUNTER — Inpatient Hospital Stay: Payer: Self-pay

## 2021-01-25 DIAGNOSIS — K5900 Constipation, unspecified: Secondary | ICD-10-CM

## 2021-01-25 DIAGNOSIS — I10 Essential (primary) hypertension: Secondary | ICD-10-CM

## 2021-01-25 DIAGNOSIS — M4624 Osteomyelitis of vertebra, thoracic region: Secondary | ICD-10-CM | POA: Diagnosis not present

## 2021-01-25 DIAGNOSIS — A419 Sepsis, unspecified organism: Secondary | ICD-10-CM

## 2021-01-25 DIAGNOSIS — M4626 Osteomyelitis of vertebra, lumbar region: Secondary | ICD-10-CM | POA: Diagnosis not present

## 2021-01-25 LAB — BASIC METABOLIC PANEL
Anion gap: 10 (ref 5–15)
BUN: 6 mg/dL — ABNORMAL LOW (ref 8–23)
CO2: 23 mmol/L (ref 22–32)
Calcium: 9.4 mg/dL (ref 8.9–10.3)
Chloride: 94 mmol/L — ABNORMAL LOW (ref 98–111)
Creatinine, Ser: 0.79 mg/dL (ref 0.61–1.24)
GFR, Estimated: >60 mL/min (ref >60)
Glucose, Bld: 108 mg/dL — ABNORMAL HIGH (ref 70–99)
Potassium: 3.5 mmol/L (ref 3.5–5.1)
Sodium: 127 mmol/L — ABNORMAL LOW (ref 135–145)

## 2021-01-25 LAB — CBC
HCT: 30.3 % — ABNORMAL LOW (ref 39.0–52.0)
Hemoglobin: 9.8 g/dL — ABNORMAL LOW (ref 13.0–17.0)
MCH: 29.9 pg (ref 26.0–34.0)
MCHC: 32.3 g/dL (ref 30.0–36.0)
MCV: 92.4 fL (ref 80.0–100.0)
Platelets: 389 10*3/uL (ref 150–400)
RBC: 3.28 MIL/uL — ABNORMAL LOW (ref 4.22–5.81)
RDW: 14.7 % (ref 11.5–15.5)
WBC: 10.3 10*3/uL (ref 4.0–10.5)
nRBC: 0 % (ref 0.0–0.2)

## 2021-01-25 LAB — SODIUM, URINE, RANDOM: Sodium, Ur: 100 mmol/L

## 2021-01-25 LAB — OSMOLALITY: Osmolality: 274 mOsm/kg — ABNORMAL LOW (ref 275–295)

## 2021-01-25 LAB — OSMOLALITY, URINE: Osmolality, Ur: 478 mOsm/kg (ref 300–900)

## 2021-01-25 MED ORDER — DIPHENHYDRAMINE HCL 25 MG PO CAPS: 25.0000 mg | ORAL_CAPSULE | Freq: Once | ORAL | Status: DC

## 2021-01-25 MED ORDER — POLYETHYLENE GLYCOL 3350 17 G PO PACK
17.0000 g | PACK | Freq: Two times a day (BID) | ORAL | Status: DC
  Administered 2021-01-25 – 2021-01-28 (×6): 17 g via ORAL
  Filled 2021-01-25 (×6): qty 1

## 2021-01-25 MED ORDER — HYDROXYZINE HCL 10 MG PO TABS
10.0000 mg | ORAL_TABLET | Freq: Three times a day (TID) | ORAL | Status: DC | PRN
  Administered 2021-01-28: 10 mg via ORAL
  Filled 2021-01-25 (×2): qty 1

## 2021-01-25 NOTE — Plan of Care (Signed)

## 2021-01-25 NOTE — Consult Note (Signed)
[facility]  Total days of antibiotics 3 (received 1 dose IV cefepime on 10/2)        Day 2 ceftriaxone (started 10/3)        Day 2 vancomycin (started 10/3)               Reason for Consult: Vertebral osteomyelitis    Referring Physician: Dickie La, MD  Active Problems:   Essential hypertension   Alcohol abuse   Compression fracture of L1 lumbar vertebra (HCC)   Osteomyelitis of thoracic spine (HCC)   Sepsis ([place])   Neuropathic pain of both legs   Osteomyelitis of lumbar spine (HCC)    HPI: Jeffrey [name] is a 70 y.o. male with HTN, HLD, prostate cancer s/p prostatectomy on 10/15/2020, alcohol use disorder, and chronic hepatitis C who presented after the results of his MRI of his lumbar spine returned, which revealed evidence of discitis and osteomyelitis at T9-T10, and T12-L1 with a compression fracture of L1. The compression fracture was known previously and was secondary to a fall that the patient experienced in July. He has been following with orthopedic surgery for this. After the MRI resulted, Dr. Lorin Mercy (ortho) encouraged the patient to come directly to the hospital for IV antibiotic therapy.   The patient states that he is still is having a significant amount of back pain, despite pain control medications. He endorses having episodes of chills, but denies any fevers. He has not had a bowel movement in >4 days and notes that he typically has a bowel movement twice a day. He also states that is he still able to ambulate, but does require the use of a cane because of his pain. The patient also endorses some urinary incontinence, but stated that this has been going on since he was first diagnosed with prostate cancer and continued even after his prostatectomy in June. He denies any dysuria, hematuria, or any other urinary symptoms. The patient notes an extensive history of alcohol and tobacco use, although he did not quantify this. He denies any history of IV  drug use.   Past Medical History:  Diagnosis Date   Arthritis    Chronic hepatitis C ([place])    Hepatic cysts 12/01/2013   Seen on CT   Hypertension    Renal cysts 12/01/2013   Seen on CT    Allergies: No Known Allergies  Current antibiotics: Vanc and ceftriaxone    MEDICATIONS:  amLODipine  10 mg Oral Daily   diphenhydrAMINE  25 mg Oral Once   enoxaparin (LOVENOX) injection  40 mg Subcutaneous Q24H   gabapentin  100 mg Oral TID   losartan  100 mg Oral Daily   melatonin  3 mg Oral QHS   multivitamin with minerals  1 tablet Oral Daily   nicotine  7 mg Transdermal Daily   polyethylene glycol  17 g Oral BID   senna  1 tablet Oral Daily   thiamine  100 mg Oral Daily    Social History   Tobacco Use   Smoking status: Every Day    Types: Cigarettes   Smokeless tobacco: Never  Vaping Use   Vaping Use: Never used  Substance Use Topics   Alcohol use: Yes    Alcohol/week: 42.0 standard drinks    Types: 42 Standard drinks or equivalent per week    Comment: 3-4 vodka drinks a day   Drug use: No    Family History  Problem Relation Age of  Onset   Cancer Maternal Aunt     Review of Systems -  Review of Systems  Constitutional:  Positive for chills. Negative for fever.  HENT: Negative.    Eyes: Negative.   Respiratory:  Negative for cough and shortness of breath.   Cardiovascular:  Negative for chest pain and palpitations.  Gastrointestinal:  Positive for constipation. Negative for nausea and vomiting.  Genitourinary:  Negative for dysuria and hematuria.       +urinary incontinence  Musculoskeletal:  Positive for back pain and falls.  Neurological:  Positive for weakness. Negative for dizziness, loss of consciousness and headaches.     OBJECTIVE: Temp:  [97.5 F (36.4 C)-98.1 F (36.7 C)] 97.6 F (36.4 C) (10/04 0715) Pulse Rate:  [66-104] 104 (10/04 0715) Resp:  [13-20] 19 (10/04 0715) BP: (143-180)/(84-133) 143/93 (10/04 0715) SpO2:  [79 %-100 %] 100 %  (10/04 0715) Weight:  [72.1 kg] 72.1 kg (10/03 2207) Physical Exam Constitutional:      General: He is in acute distress.  Eyes:     Extraocular Movements: Extraocular movements intact.  Cardiovascular:     Rate and Rhythm: Normal rate and regular rhythm.     Pulses: Normal pulses.     Heart sounds: No murmur heard. Pulmonary:     Effort: No respiratory distress.     Breath sounds: Normal breath sounds.  Abdominal:     General: Bowel sounds are normal.     Palpations: Abdomen is soft.     Tenderness: There is no abdominal tenderness.  Musculoskeletal:     Right lower leg: No edema.     Left lower leg: No edema.     Comments: Positive left straight leg test  Neurological:     Mental Status: He is alert and oriented to person, place, and time.     Comments: Unable to assess gait 2/2 severe back pain  Lower extremity strength 5/5 bilaterally      LABS: Results for orders placed or performed during the hospital encounter of 01/23/21 (from the past 48 hour(s))  Resp Panel by RT-PCR (Flu A&B, Covid) Nasopharyngeal Swab     Status: None   Collection Time: 01/23/21  7:40 PM   Specimen: Nasopharyngeal Swab; Nasopharyngeal(NP) swabs in vial transport medium  Result Value Ref Range   SARS Coronavirus 2 by RT PCR NEGATIVE NEGATIVE    Comment: (NOTE) SARS-CoV-2 target nucleic acids are NOT DETECTED.  The SARS-CoV-2 RNA is generally detectable in upper respiratory specimens during the acute phase of infection. The lowest concentration of SARS-CoV-2 viral copies this assay can detect is 138 copies/mL. A negative result does not preclude SARS-Cov-2 infection and should not be used as the sole basis for treatment or other patient management decisions. A negative result may occur with  improper specimen collection/handling, submission of specimen other than nasopharyngeal swab, presence of viral mutation(s) within the areas targeted by this assay, and inadequate number of  viral copies(<138 copies/mL). A negative result must be combined with clinical observations, patient history, and epidemiological information. The expected result is Negative.  Fact Sheet for Patients:  EntrepreneurPulse.com.au  Fact Sheet for Healthcare Providers:  IncredibleEmployment.be  This test is no t yet approved or cleared by the [place] FDA and  has been authorized for detection and/or diagnosis of SARS-CoV-2 by FDA under an Emergency Use Authorization (EUA). This EUA will remain  in effect (meaning this test can be used) for the duration of the COVID-19 declaration under Section 564(b)(1) of the  Act, 21 U.S.C.section 360bbb-3(b)(1), unless the authorization is terminated  or revoked sooner.       Influenza A by PCR NEGATIVE NEGATIVE   Influenza B by PCR NEGATIVE NEGATIVE    Comment: (NOTE) The Xpert Xpress SARS-CoV-2/FLU/RSV plus assay is intended as an aid in the diagnosis of influenza from Nasopharyngeal swab specimens and should not be used as a sole basis for treatment. Nasal washings and aspirates are unacceptable for Xpert Xpress SARS-CoV-2/FLU/RSV testing.  Fact Sheet for Patients: EntrepreneurPulse.com.au  Fact Sheet for Healthcare Providers: IncredibleEmployment.be  This test is not yet approved or cleared by the [place] FDA and has been authorized for detection and/or diagnosis of SARS-CoV-2 by FDA under an Emergency Use Authorization (EUA). This EUA will remain in effect (meaning this test can be used) for the duration of the COVID-19 declaration under Section 564(b)(1) of the Act, 21 U.S.C. section 360bbb-3(b)(1), unless the authorization is terminated or revoked.  Performed at [facility], [place] [address], [place], [place] [address]   Blood culture (routine x 2)     Status: None (Preliminary result)   Collection Time: 01/23/21  8:00 PM   Specimen: BLOOD LEFT  HAND  Result Value Ref Range   Specimen Description BLOOD LEFT HAND    Special Requests      BOTTLES DRAWN AEROBIC AND ANAEROBIC Blood Culture results may not be optimal due to an inadequate volume of blood received in culture bottles   Culture      NO GROWTH 2 DAYS Performed at [facility], [place] [address], [place], [place] [address]    Report Status PENDING   CBC with Differential     Status: Abnormal   Collection Time: 01/23/21  8:20 PM  Result Value Ref Range   WBC 15.8 (H) 4.0 - 10.5 K/uL   RBC 3.59 (L) 4.22 - 5.81 MIL/uL   Hemoglobin 10.9 (L) 13.0 - 17.0 g/dL   HCT 33.7 (L) 39.0 - 52.0 %   MCV 93.9 80.0 - 100.0 fL   MCH 30.4 26.0 - 34.0 pg   MCHC 32.3 30.0 - 36.0 g/dL   RDW 15.2 11.5 - 15.5 %   Platelets PLATELET CLUMPS NOTED ON SMEAR, UNABLE TO ESTIMATE 150 - 400 K/uL   nRBC 0.0 0.0 - 0.2 %   Neutrophils Relative % 89 %   Neutro Abs 13.9 (H) 1.7 - 7.7 K/uL   Lymphocytes Relative 6 %   Lymphs Abs 0.9 0.7 - 4.0 K/uL   Monocytes Relative 5 %   Monocytes Absolute 0.8 0.1 - 1.0 K/uL   Eosinophils Relative 0 %   Eosinophils Absolute 0.0 0.0 - 0.5 K/uL   Basophils Relative 0 %   Basophils Absolute 0.0 0.0 - 0.1 K/uL   WBC Morphology MORPHOLOGY UNREMARKABLE    RBC Morphology MORPHOLOGY UNREMARKABLE    Smear Review PLATELET CLUMPS NOTED ON SMEAR, UNABLE TO ESTIMATE    Immature Granulocytes 0 %   Abs Immature Granulocytes 0.05 0.00 - 0.07 K/uL    Comment: Performed at [facility], [place] [address] [place], [place] [address]  Comprehensive metabolic panel     Status: Abnormal   Collection Time: 01/23/21  8:20 PM  Result Value Ref Range   Sodium 130 (L) 135 - 145 mmol/L   Potassium 3.6 3.5 - 5.1 mmol/L   Chloride 97 (L) 98 - 111 mmol/L   CO2 19 (L) 22 - 32 mmol/L   Glucose, Bld 95 70 - 99 mg/dL  Comment: Glucose reference range applies only to samples taken after fasting for at least 8 hours.   BUN 7 (L) 8 - 23 mg/dL   Creatinine, Ser 0.93 0.61 - 1.24 mg/dL    Calcium 10.0 8.9 - 10.3 mg/dL   Total Protein 8.4 (H) 6.5 - 8.1 g/dL   Albumin 3.4 (L) 3.5 - 5.0 g/dL   AST 14 (L) 15 - 41 U/L   ALT 6 0 - 44 U/L   Alkaline Phosphatase 118 38 - 126 U/L   Total Bilirubin 0.4 0.3 - 1.2 mg/dL   GFR, Estimated >60 >60 mL/min    Comment: (NOTE) Calculated using the CKD-EPI Creatinine Equation (2021)    Anion gap 14 5 - 15    Comment: Performed at [facility], [place] [address] [place], [place] [address]  Lactic acid, plasma     Status: Abnormal   Collection Time: 01/23/21  8:20 PM  Result Value Ref Range   Lactic Acid, Venous 2.4 (HH) 0.5 - 1.9 mmol/L    Comment: CRITICAL RESULT CALLED TO, READ BACK BY AND VERIFIED WITH: Meyer Russel RN 01/23/21 2059 Wiliam Ke Performed at [facility], [place] [address] [place], [place] [address]   Lactic acid, plasma     Status: None   Collection Time: 01/23/21 10:00 PM  Result Value Ref Range   Lactic Acid, Venous 1.5 0.5 - 1.9 mmol/L    Comment: Performed at [facility] [address], [place], [place] [address]  HIV Antibody (routine testing w rflx)     Status: None   Collection Time: 01/24/21 12:00 AM  Result Value Ref Range   HIV Screen 4th Generation wRfx Non Reactive Non Reactive    Comment: Performed at [facility], [place] [address] [place], [place] [address]  Urine Culture     Status: Abnormal (Preliminary result)   Collection Time: 01/24/21  2:10 AM   Specimen: Urine, Clean Catch  Result Value Ref Range   Specimen Description URINE, CLEAN CATCH    Special Requests NONE    Culture (A)     >=100,000 COLONIES/mL ESCHERICHIA COLI SUSCEPTIBILITIES TO FOLLOW Performed at [facility], [place] [address], [place], [place] [address]    Report Status PENDING   CBC WITH DIFFERENTIAL     Status: Abnormal   Collection Time: 01/24/21  2:10 AM  Result Value Ref Range   WBC 9.9 4.0 - 10.5 K/uL   RBC 3.18 (L) 4.22 - 5.81 MIL/uL   Hemoglobin 9.7 (L) 13.0 - 17.0 g/dL   HCT 30.1  (L) 39.0 - 52.0 %   MCV 94.7 80.0 - 100.0 fL   MCH 30.5 26.0 - 34.0 pg   MCHC 32.2 30.0 - 36.0 g/dL   RDW 15.1 11.5 - 15.5 %   Platelets 398 150 - 400 K/uL   nRBC 0.0 0.0 - 0.2 %   Neutrophils Relative % 74 %   Neutro Abs 7.3 1.7 - 7.7 K/uL   Lymphocytes Relative 15 %   Lymphs Abs 1.5 0.7 - 4.0 K/uL   Monocytes Relative 10 %   Monocytes Absolute 1.0 0.1 - 1.0 K/uL   Eosinophils Relative 1 %   Eosinophils Absolute 0.1 0.0 - 0.5 K/uL   Basophils Relative 0 %   Basophils Absolute 0.0 0.0 - 0.1 K/uL   Immature Granulocytes 0 %   Abs Immature Granulocytes 0.03 0.00 - 0.07 K/uL    Comment: Performed at [facility], [address] [place], [place] [address]  Hemoglobin A1c     Status: Abnormal   Collection Time: 01/24/21  2:10 AM  Result Value Ref Range   Hgb A1c MFr Bld 4.5 (L) 4.8 - 5.6 %    Comment: (NOTE) Pre diabetes:          5.7%-6.4%  Diabetes:              >6.4%  Glycemic control for   <7.0% adults with diabetes    Mean Plasma Glucose 82.45 mg/dL    Comment: Performed at [facility] [address], [place], [place] [address]  Urinalysis, Routine w reflex microscopic Urine, Clean Catch     Status: Abnormal   Collection Time: 01/24/21  2:10 AM  Result Value Ref Range   Color, Urine YELLOW YELLOW   APPearance HAZY (A) CLEAR   Specific Gravity, Urine 1.020 1.005 - 1.030   pH 5.0 5.0 - 8.0   Glucose, UA NEGATIVE NEGATIVE mg/dL   Hgb urine dipstick SMALL (A) NEGATIVE   Bilirubin Urine NEGATIVE NEGATIVE   Ketones, ur 20 (A) NEGATIVE mg/dL   Protein, ur 30 (A) NEGATIVE mg/dL   Nitrite POSITIVE (A) NEGATIVE   Leukocytes,Ua MODERATE (A) NEGATIVE   RBC / HPF 0-5 0 - 5 RBC/hpf   WBC, UA >50 (H) 0 - 5 WBC/hpf   Bacteria, UA MANY (A) NONE SEEN   Squamous Epithelial / LPF 0-5 0 - 5   Mucus PRESENT     Comment: Performed at [facility], [place] [address], [place], [place] [address]  Comprehensive metabolic panel     Status: Abnormal   Collection Time:  01/24/21  2:10 AM  Result Value Ref Range   Sodium 129 (L) 135 - 145 mmol/L   Potassium 3.6 3.5 - 5.1 mmol/L   Chloride 100 98 - 111 mmol/L   CO2 21 (L) 22 - 32 mmol/L   Glucose, Bld 101 (H) 70 - 99 mg/dL    Comment: Glucose reference range applies only to samples taken after fasting for at least 8 hours.   BUN 8 8 - 23 mg/dL   Creatinine, Ser 0.82 0.61 - 1.24 mg/dL   Calcium 9.5 8.9 - 10.3 mg/dL   Total Protein 7.3 6.5 - 8.1 g/dL   Albumin 2.9 (L) 3.5 - 5.0 g/dL   AST 10 (L) 15 - 41 U/L   ALT 6 0 - 44 U/L   Alkaline Phosphatase 115 38 - 126 U/L   Total Bilirubin 0.7 0.3 - 1.2 mg/dL   GFR, Estimated >60 >60 mL/min    Comment: (NOTE) Calculated using the CKD-EPI Creatinine Equation (2021)    Anion gap 8 5 - 15    Comment: Performed at [facility] [address] [place], [place] [address]  Vitamin B12     Status: None   Collection Time: 01/24/21  2:10 AM  Result Value Ref Range   Vitamin B-12 241 180 - 914 pg/mL    Comment: (NOTE) This assay is not validated for testing neonatal or myeloproliferative syndrome specimens for Vitamin B12 levels. Performed at [facility], [place] [address], [place],  [address]   Osmolality     Status: Abnormal   Collection Time: 01/25/21  2:10 AM  Result Value Ref Range   Osmolality 274 (L) 275 - 295 mOsm/kg    Comment: Performed at [facility], [place] [address], [place],  [address]  Basic  metabolic panel     Status: Abnormal   Collection Time: 01/25/21  3:11 AM  Result Value Ref Range   Sodium 127 (L) 135 - 145 mmol/L   Potassium 3.5 3.5 - 5.1 mmol/L   Chloride 94 (L) 98 - 111 mmol/L   CO2 23 22 - 32 mmol/L   Glucose, Bld 108 (H) 70 - 99 mg/dL    Comment: Glucose reference range applies only to samples taken after fasting for at least 8 hours.   BUN 6 (L) 8 - 23 mg/dL   Creatinine, Ser 0.79 0.61 - 1.24 mg/dL   Calcium 9.4 8.9 - 10.3 mg/dL   GFR, Estimated >60 >60 mL/min    Comment: (NOTE) Calculated using  the CKD-EPI Creatinine Equation (2021)    Anion gap 10 5 - 15    Comment: Performed at [facility] [address] [place], [place] [address]  CBC     Status: Abnormal   Collection Time: 01/25/21  3:11 AM  Result Value Ref Range   WBC 10.3 4.0 - 10.5 K/uL   RBC 3.28 (L) 4.22 - 5.81 MIL/uL   Hemoglobin 9.8 (L) 13.0 - 17.0 g/dL   HCT 30.3 (L) 39.0 - 52.0 %   MCV 92.4 80.0 - 100.0 fL   MCH 29.9 26.0 - 34.0 pg   MCHC 32.3 30.0 - 36.0 g/dL   RDW 14.7 11.5 - 15.5 %   Platelets 389 150 - 400 K/uL   nRBC 0.0 0.0 - 0.2 %    Comment: Performed at [facility], [place] [address], [place], [place] [address]    MICRO:  IMAGING: MR THORACIC SPINE W WO CONTRAST  Result Date: 01/24/2021 CLINICAL DATA:  Initial evaluation for epidural abscess. EXAM: MRI THORACIC WITHOUT AND WITH CONTRAST TECHNIQUE: Multiplanar and multiecho pulse sequences of the thoracic spine were obtained without and with intravenous contrast. CONTRAST:  87mL GADAVIST GADOBUTROL 1 MMOL/ML IV SOLN COMPARISON:  MRI of the lumbar spine from 01/21/2021. FINDINGS: Alignment:  Examination moderately degraded by motion artifact. Mild exaggeration of the normal thoracic kyphosis. No significant listhesis. Vertebrae: Abnormal edema and enhancement seen involving the T9 and T10 vertebral bodies as well as the intervening T9-10 interspace. Associated disc space narrowing with endplate irregularity, erosion, and enhancement. Findings consistent with acute osteomyelitis discitis. Extension to involve the posterior elements as well, with possible septic arthritis involving the T9-10 facets bilaterally. Associated pathologic fracture of T9 with up to approximately 40% height loss and trace 3 mm bony retropulsion. Mild height loss noted at the adjacent superior endplate of N56 as well. Associated epidural enhancement/phlegmon within the adjacent ventral epidural space at the level of T9-10 (series 11, image 9). No frank epidural abscess.  Additionally, phlegmonous changes present within the paraspinous soft tissues adjacent to the T9-10 interspace without definite discrete abscess or drainable fluid collection. Additional changes consistent with osteomyelitis discitis at T12-L1 again seen as well. Height loss at the L1 vertebral body suspected to reflect an associated pathologic fracture as well. Associated height loss measures up to approximately 50% with 5 mm bony retropulsion. Associated epidural enhancement/phlegmon at this level as well without frank epidural abscess. Mild paraspinous phlegmonous change without discrete soft tissue abscess or drainable fluid collection. No other sites of acute infection within the thoracic spine. Vertebral body height otherwise maintained. Bone marrow signal intensity diffusely heterogeneous. No visible worrisome osseous lesions. Cord: Signal intensity within the thoracic spinal cord is grossly within normal limits on this motion degraded  exam. Diffuse prominence of the dorsal epidural fat throughout the thoracic spine noted. Paraspinal and other soft tissues: Paraspinous phlegmonous change adjacent to the T9-10 and T12-L1 interspaces as above. Mild diffuse subcutaneous edema seen throughout the visualized mid and lower back, which could be related to overall volume status. Approximate 1 cm T2 hyperintense simple cyst partially visualize within the left kidney. Multiple scattered probable cysts noted within the visualized liver, largest of which measures approximately 1 cm (series 6, image 23). Disc levels: T1-2: Degenerative intervertebral disc space narrowing with diffuse disc bulge and reactive endplate spurring. Bilateral facet hypertrophy. No significant stenosis. T2-3: Degenerative intervertebral disc space narrowing with diffuse disc bulge and reactive endplate spurring. Bilateral facet hypertrophy. No significant spinal stenosis. Foramina remain patent. T3-4: Degenerative intervertebral disc space  narrowing with diffuse disc bulge and reactive endplate spurring. Bilateral facet hypertrophy with prominence of the dorsal epidural fat. Resultant mild spinal stenosis. Foramina remain patent. T4-5: Disc desiccation without significant disc bulge. Bilateral facet hypertrophy with prominence of the dorsal epidural fat. Moderate spinal stenosis, largely due to epidural fat. Foramina remain patent. T5-6: Negative interspace. Mild facet hypertrophy. Prominent dorsal epidural fat with associated moderate spinal stenosis. Foramina remain patent. T6-7: Negative interspace. Mild facet hypertrophy. Prominent dorsal epidural fat with associated moderate spinal stenosis. Foramina remain patent. T7-8: Negative interspace. Prominence of the dorsal epidural fat with associated moderate spinal stenosis. Foramina remain patent. T8-9: Disc desiccation with minimal disc bulge. Prominence of the dorsal epidural fat. Resultant moderate spinal stenosis. Foramina remain patent. T9-10: Changes consistent with osteomyelitis discitis with associated T9 pathologic compression fracture and 3 mm bony retropulsion. Mild phlegmon within the ventral epidural space. Superimposed facet hypertrophy with prominence of the dorsal epidural fat. Resultant moderate spinal stenosis with minimal cord flattening, but no definite cord signal changes. Moderate bilateral foraminal narrowing. T10-11: Negative interspace. Bilateral facet hypertrophy. Prominence of the epidural fat. No significant spinal stenosis. Foramina remain patent. T11-12: Negative interspace. Bilateral facet hypertrophy with mild epidural lipomatosis. No significant spinal stenosis. Foramina remain patent. T12-L1: Changes consistent with osteomyelitis discitis. Pathologic L1 compression fracture with 5 mm bony retropulsion. Mild epidural phlegmon within the ventral epidural space. Bilateral facet hypertrophy with epidural lipomatosis. Resultant moderate spinal stenosis with mild cord  flattening is relatively similar to previous. No definite cord signal changes. Mild bilateral foraminal narrowing. IMPRESSION: 1. Changes consistent with acute osteomyelitis discitis at T9-10 and T12-L1 as above. Associated epidural enhancement/phlegmon within the adjacent ventral epidural spaces at these levels without frank epidural abscess. Suspected involvement of the bilateral T9-10 facets with associated septic arthritis. 2. Associated pathologic compression fracture of T9 with 40% height loss and 3 mm bony retropulsion, with additional pathologic compression fracture of L1 with 50% height loss and 5 mm bony retropulsion. Associated moderate spinal stenosis at these levels, similar to previous. 3. Superimposed multilevel degenerative spondylosis, facet hypertrophy, and epidural lipomatosis. Resultant moderate diffuse spinal stenosis throughout the thoracic spine as above. Electronically Signed   By: Jeannine Boga M.D.   On: 01/24/2021 01:56   DG Abd 2 Views  Result Date: 01/24/2021 CLINICAL DATA:  Abdominal pain and constipation, initial encounter EXAM: ABDOMEN - 2 VIEW COMPARISON:  CT from 09/30/2020 FINDINGS: Cardiac shadow is within normal limits. Lungs are clear bilaterally. No acute bony abnormality in the chest is noted. Scattered large and small bowel gas is noted. No free intraperitoneal air is seen. No abnormal mass or abnormal calcifications are noted. No acute bony abnormality is seen. Degenerative changes of lumbar  spine are noted. IMPRESSION: No acute abnormality in the chest and abdomen. Electronically Signed   By: Inez Catalina M.D.   On: 01/24/2021 01:36    HISTORICAL MICRO/IMAGING  Assessment/Plan:    #Vertebral osteomyelitis and discitis Patient is a 70 yo male who presented to the hospital per ortho recs after results of his MRI revealed discitis and osteomyelitis at T9-T10 and T12-L1. Of note, the patient has a history of prostate cancer s/p robotic prostatectomy on  10/15/20. He also had a fall in July in which he sustained a compression fracture of L1.  - Started on IV vanc and ceftriaxone on 10/3 - recommend at least 6 weeks of therapy - Blood cultures with no growth at 48 hrs, will continue to follow - Pain management per primary team  - PICC line for long term IV antibiotics  - Will require follow up in ID clinic on discharge

## 2021-01-25 NOTE — Progress Notes (Addendum)
Family Medicine Teaching Service Daily Progress Note Intern Pager: [phone]  Patient name: Jeffrey [name] Medical record number: [id number] Date of birth: 1950-08-02 Age: 70 y.o. Gender: male  Primary Care Provider: Carollee Leitz, MD Consultants: Orthopedic Surgery and Infectious Disease   Code Status: Full Code  [name] PMH is significant for hypertension, history of prostate cancer s/p radical prostatectomy (10/15/2020), chronic alcohol use disorder, cataracts, dyslipidemia, history of hepatitis C & hepatic cysts.   Vertebral osteomyelitis and discitis T9-T10, T12-L1 and spinal stenosis throughout the thoracic spine  Overnight the patient used Dilaudid for pain management and experienced pruritis, Benadryl 25 mg was helpful. Recommend hydroxyzine has needed for pruritis, may be helpful for BP and anxiety. Per orthopedic surgery, no surgery indicated at this time and the patient will likely need PICC line for home IV antibiotics. Consulted infectious disease to discuss recommendations for antibiotics. Blood cultures showed no growth at 24 hours. Urine culture showed 100,000 colonies gram negative rods. Will continue current antibiotics and await ID recommendations. Normal leukocyte count and afebrile. HIV negative.  - Infectious disease consulted, appreciate their care and recommendations  - Orthopedic surgery (Dr. Rodell Perna) following, appreciate their care and recommendations - Continue IV Vancomycin, dosage per pharmacy consult  - Continue IV Ceftriaxone 2g q24 hours - Continue Oxycodone 10mg  q3 hours PO PRN for moderate pain - Continue Dilaudid 1mg  q 4 hours PO PRN for severe or breakthrough pain -  Hydroxyzine 10 mg PRN for pruritis  - Vital per unit protocol  - Up with assistance - PT/OT evaluate and treat - AM CBC, CMP   Hypertension Has remained HTN with SBP (144-167) and DBP (84-133). Home medications include amlodipine 10 mg daily and losartan 100 mg daily. Given patient's age, target blood pressure will continue with current medications and continue to monitor. Hydroxyzine for pruritis may be beneficial for HTN. Increase in BP may be related to pain or alcohol withdrawal.  - Continue home Losartan 100 mg daily and Amlodipine 10 mg daily  - Monitor BP with VS   Chronic hyponatremia Chronic hyponatremia most likely due to alcohol use disorder. Sodium ranging from 127 to 138 since 2016. Today corrected sodium of 127.  - Further workup with urine sodium, urine osmolality, and serum osmolality  - CTM   Constipation Per patient, has not had a bowel movement in 5 days. 10/3: KUB showed no acute abnormality.  - Miralax X 17 g daily BID  - Senna 1 tablet daily  Alcohol Use Disorder No noticed tremor and denies cravings. However, is agitated. Patient drinks 3-4 drinks daily with a total of 42 weekly.  No history of DT or withdrawal seizures.Last drink at 1800 on 01/22/21. - CIWA protocol without Ativan for now - Continue Thiamine 100 mg PO daily - Continue Multivitamin tablet daily  Radiculopathy in bilateral lower extremities Intermittent tingling in bilateral feet that feels "like pins and needles." Most likely secondary to spinal stenosis.  - Continue Gabapentin 100 mg TID   Chronic Hepatitis C, Genotype 1 Positive hepatitis C in early 2000's.  No history of IV drug abuse, continues to deny this on admission. In 2017, test result for hepatitis C RNA by PCR was positive.  He completed 8  weeks of [name] treatment.  He had work-up to include liver staging with elastography which showed no cirrhosis and only F2 to F3 fibrosis.  Today, AST 10 and ALT 6. - Avoid hepatotoxic  medications   Tobacco use disorder Smokes ~8 cigarettes per day for decades.  -Nicotine patch 7 mg    Hepatic and renal cysts Chronic. Seen on CT imaging in 2015. Creatinine 0.79, BUN 6.    History of localized prostate cancer S/p robotic assisted laparoscopic radical prostatectomy on 10/15/2020 by Dr. Alexis Frock with urethral catheter on discharge for 1 week.   - Patient to continue with outpatient urology follow up    History of dyslipidemia Patient has history of dyslipidemia. He was evaluated for this by cardiology in 2021.  Believed that etiology is his longstanding alcohol abuse and the effects on the liver affecting cholesterol regulation.  No therapy or testing recommended at that time as he was not at high risk for heart disease. Most recent lipid panel in 2021 wnl.  - No further intervention at this time  - Follow up with PCP    History of benign neoplasm of colon, s/p right colectomy Patient had an open right partial colectomy for polyps with primary anastomosis in June 2012 in [place].  Patient is supposed to have colonoscopies every 2 years. Last colonoscopy in 2018 with removal of sessile polyp.  - Will recommend maintaining outpatient schedule   Circadian Rhythm Regulation   Continue Melatonin 3 mg nightly.   Delirium Prevention Protocol Although this patient is not currently delirious he is at greater risk given his age and deliriogenic medications. - RN to open blinds every morning - To bedside: glasses, hearing aide, patient's own shoes. Make available to patient's when possible and encourage use - RN to assess orientation to person, place and time qam and prn, with frequent reorientation and introduction of caregivers, with date written on whiteboard and clock in room. - Recommend extended visiting hours with familiar family/friends as feasible - Encourage normal sleep-wake cycle by promoting a dark, quiet environment at night and stimulating, light environment  during the day. - Turn the TV off when patient is asleep or not in use. - Avoid deliriogenic medications (opiates, benzodiazepines, antihistamines), as medically able    FEN/GI: Cardiac Diet  PPx: Lovenox  Dispo:Pending PT recommendations  pending clinical improvement . Barriers include orthopedic recommendations.   Subjective:  Patient voiced frustration this morning stating that the staff is asking the same questions. Stated that he saw orthopedic surgeon yesterday. States understanding that he will likely need long term antibiotics. Has had itching with pain medication. Benadryl helped a little. Does not think he has taken hydroxyzine in the past. Still experiencing back pain. Says he asks for medication but it takes time before the medication gets to him. No reported tactile hallucinations and no cravings for alcohol.  Objective: Temp:  [97.5 F (36.4 C)-98.1 F (36.7 C)] 98 F (36.7 C) (10/04 0450) Pulse Rate:  [66-100] 96 (10/04 0450) Resp:  [13-20] 18 (10/04 0450) BP: (144-180)/(84-133) 151/97 (10/04 0450) SpO2:  [79 %-100 %] 100 % (10/04 0450) Weight:  [72.1 kg] 72.1 kg (10/03 2207) Physical Exam: Physical Exam  Gen: AOx3, in NAD HEENT: NCAT, no LAD, sclera anicteric, cloudy lenses, Edentulous upper teeth with one golden tooth, poor dentition. Mucous membranes moist. CV: RRR, no mgr, 1/2+ bilateral dorsalis pedis Lung: CTAB, normal WOB Abd: NT, mildly distended +BS Neuro: CN II-XII grossly intact, Aox4  Skin: warm, dry,  no rashes or open lesions noted  Psych: frustrated, agitated    Laboratory: Recent Labs  Lab 01/23/21 2020 01/24/21 0210 01/25/21 0311  WBC 15.8* 9.9 10.3  HGB 10.9* 9.7* 9.8*  HCT 33.7* 30.1* 30.3*  PLT PLATELET CLUMPS NOTED ON SMEAR, UNABLE TO ESTIMATE 398 389   Recent Labs  Lab 01/23/21 2020 01/24/21 0210 01/25/21 0311  NA 130* 129* 127*  K 3.6 3.6 3.5  CL 97* 100 94*  CO2 19* 21* 23  BUN 7* 8 6*  CREATININE 0.93 0.82 0.79  CALCIUM 10.0  9.5 9.4  PROT 8.4* 7.3  --   BILITOT 0.4 0.7  --   ALKPHOS 118 115  --   ALT 6 6  --   AST 14* 10*  --   GLUCOSE 95 101* 108*    Imaging/Diagnostic Tests: No results found.   Silver Huguenin, Medical Student 01/25/2021, 6:59 AM [place] Intern pager: [phone], text pages welcome   FPTS Upper-Level Resident Addendum   I have independently interviewed and examined the patient. I have discussed the above with the original author and agree with their documentation.  Please see also any attending notes.    Carollee Leitz, MD PGY-3, [facility] 01/25/2021 1:55 PM  [place] Service pager: [phone] (text pages welcome through [facility])

## 2021-01-25 NOTE — Telephone Encounter (Signed)
Pt called with results. He will go to ER for admission.

## 2021-01-25 NOTE — Progress Notes (Signed)
Occupational Therapy Evaluation Patient Details Name: Jeffrey [name] MRN: [id number] DOB: [birth year] Today's Date: 01/25/2021   History of Present Illness Jeffrey [name] is a 70 y.o. male  who presented after the results of his MRI of his lumbar spine returned, which revealed evidence of discitis and osteomyelitis at T9-T10, and T12-L1 with a compression fracture of L1. The compression fx was known, due to a fall in July. PMHx: HTN, HLD, prostate cancer s/p prostatectomy on 10/15/2020, alcohol use disorder, and chronic hepatitis C   Clinical Impression   Jeffrey [name] was mod I with use of cane prior to this admission, drives, and completes all IADLs. He states he has been using his [facility] and back brace since his fall in July; otherwise getting around fairly well with no direct physical assist. He states he lives alone in a 1 level home with 4-3 STE. Pt was agitated and impulsive this session; ignored cues and education for back precautions, and declined any assistance from therapist or AD; therefore session limited to stand pivot from bed>SBC for BM.  Pt is limited by back pain with movement, poor insight to his deficits and safety. Pt would benefit from continued OT acutely. Recommend d/c to CIR to progress ADLs and mobility to mod I level prior to d/c home.      Recommendations for follow up therapy are one component of a multi-disciplinary discharge planning process, led by the attending physician.  Recommendations may be updated based on patient status, additional functional criteria and insurance authorization.   Follow Up Recommendations  CIR;Supervision/Assistance - 24 hour    Equipment Recommendations  3 in 1 bedside commode (RW)       Precautions / Restrictions Precautions Precautions: Fall;Back Precaution Booklet Issued: No Restrictions Weight Bearing Restrictions: No      Mobility Bed Mobility                    Transfers Overall transfer level: Needs assistance    Transfers: Sit to/from Stand;Stand Pivot Transfers Sit to Stand: Min guard Stand pivot transfers: Min guard       General transfer comment: min guard for safety, 1 UE supported throughout    Balance Overall balance assessment: Needs assistance;History of Falls Sitting-balance support: Feet supported Sitting balance-Leahy Scale: Fair     Standing balance support: Single extremity supported;During functional activity Standing balance-Leahy Scale: Fair                             ADL either performed or assessed with clinical judgement   ADL Overall ADL's : Needs assistance/impaired Eating/Feeding: Independent;Sitting   Grooming: Set up;Sitting   Upper Body Bathing: Minimal assistance;Sitting   Lower Body Bathing: Moderate assistance;Sit to/from stand   Upper Body Dressing : Set up;Sitting   Lower Body Dressing: Moderate assistance;Sit to/from stand   Toilet Transfer: Min guard;Cueing for [employer]- Water quality scientist and Hygiene: Min guard;Sit to/from stand;Cueing for back precautions;Cueing for compensatory techniques       Functional mobility during ADLs: Min guard;Cueing for safety General ADL Comments: min gaurd for safety throughout pt declined use of AD for OOB mobility therefore limited to stand pivot to [facility] only. pt observed bendign a twisting throughout session and did not appreciate any back education     Vision Baseline Vision/History: 0 No visual deficits Patient Visual Report: No change from baseline Vision Assessment?: No apparent visual deficits  Pertinent Vitals/Pain Pain Assessment: Faces Faces Pain Scale: Hurts whole lot Pain Location: back and BLE with movement Pain Descriptors / Indicators: Discomfort;Grimacing;Guarding Pain Intervention(s): Limited activity within patient's tolerance;Monitored during session;Repositioned     Hand Dominance Right   Extremity/Trunk Assessment Upper  Extremity Assessment Upper Extremity Assessment: Overall WFL for tasks assessed   Lower Extremity Assessment Lower Extremity Assessment: Defer to PT evaluation   Cervical / Trunk Assessment Cervical / Trunk Assessment: Other exceptions Cervical / Trunk Exceptions: L1 compression fx   Communication Communication Communication: No difficulties   Cognition Arousal/Alertness: Awake/alert Behavior During Therapy: Agitated;Impulsive Overall Cognitive Status: No family/caregiver present to determine baseline cognitive functioning           General Comments: pt agitated throughout session, cussing at therapist, impulsive wtih OOB mobility refused to allow therapist to assist or stand near him for safety.   General Comments  VSS on RA pt had BM on [place] expects to be discharged to:: Private residence Living Arrangements: Alone Available Help at Discharge: Family;Available PRN/intermittently Type of Home: House Home Access: Stairs to enter [employer] of Steps: 3-4 Entrance Stairs-Rails: Right Home Layout: One level     Bathroom Shower/Tub: Occupational psychologist: Standard     Home Equipment: Cane - single point   Additional Comments: limited history obtained, pt agitated at the time of eval      Prior Functioning/Environment Level of Independence: Independent with assistive device(s)        Comments: pt uses cane and back brace for mobility        OT Problem List: Decreased strength;Decreased range of motion;Decreased activity tolerance;Impaired balance (sitting and/or standing);Decreased safety awareness;Decreased knowledge of use of DME or AE;Decreased knowledge of precautions;Pain      OT Treatment/Interventions:      OT Goals(Current goals can be found in the care plan section) Acute Rehab OT Goals Patient Stated Goal: "get out of here" OT Goal Formulation: With patient Time For Goal Achievement:  01/25/21 Potential to Achieve Goals: Fair ADL Goals Pt Will Perform Grooming: with modified independence;standing Pt Will Perform Lower Body Bathing: with set-up;sit to/from stand Pt Will Perform Lower Body Dressing: with set-up;sit to/from stand Pt Will Transfer to Toilet: with modified independence;ambulating;regular height toilet Pt Will Perform Toileting - Clothing Manipulation and hygiene: sitting/lateral leans   AM-PAC OT "6 Clicks" Daily Activity     Outcome Measure Help from another person eating meals?: None Help from another person taking care of personal grooming?: A Little Help from another person toileting, which includes using toliet, bedpan, or urinal?: A Little Help from another person bathing (including washing, rinsing, drying)?: A Lot Help from another person to put on and taking off regular upper body clothing?: None Help from another person to put on and taking off regular lower body clothing?: A Lot 6 Click Score: 18   End of Session Nurse Communication: Mobility status;Precautions;Weight bearing status  Activity Tolerance: Patient tolerated treatment well;Treatment limited secondary to agitation Patient left: in bed;with call bell/phone within reach;with bed alarm set  OT Visit Diagnosis: Unsteadiness on feet (R26.81);Other abnormalities of gait and mobility (R26.89);History of falling (Z91.81);Muscle weakness (generalized) (M62.81);Pain                Time: 4580-9983 OT Time Calculation (min): 22 min Charges:  OT General Charges $OT Visit: 1 Visit OT Evaluation $OT Eval Moderate Complexity: 1 Mod    Neve Branscomb A Colan Laymon 01/25/2021, 3:30 PM

## 2021-01-25 NOTE — Progress Notes (Cosign Needed Addendum)
FPTS Interim Progress Note  S: Patient seen and evaluated at bedside on nighttime rounds.  Complaining of back pain, rated as 7/10 in severity.  He was uncomfortable appearing, but not in any acute distress.  He said the bed in the ED was uncomfortable.  He was pleasant and able to answer all questions appropriately.  He had just received Dilaudid 1 mg x 1, to which she says does help with his pain.  RN was in the room preparing to move patient to the floor.  No tremors, other signs of alcohol withdrawal.  Received page at 2400 from floor nurse on 3 W. that patient was experiencing itching after receiving Dilaudid dose.  Order placed for 25 mg p.o. Benadryl.  We will continue to follow.  O: BP (!) 167/84 (BP Location: Right Arm)   Pulse 82   Temp (!) 97.5 F (36.4 C) (Oral)   Resp 20   Ht 5\' 8"  (1.727 m)   Wt 72.1 kg   SpO2 98%   BMI 24.18 kg/m   General: Uncomfortable appearing, nontoxic, not in any acute distress Respiratory: Normal respiratory rate.  No increased work of breathing Cardiovascular: Regular rate, no tachycardia  A/P: Benadryl 25 mg p.o. x1 for itching Continue with current pain regimen Continue with plan per day team  Labs and orders reviewed.  Vital signs stable.  Orvis Brill, DO 01/25/2021, 12:01 AM PGY-1, [place] [facility] Service pager [phone]

## 2021-01-25 NOTE — Progress Notes (Signed)
Spoke with Ty, RN, she was made aware that PICC will not be placed until 10/5. Will continue to monitor.

## 2021-01-26 DIAGNOSIS — S32010A Wedge compression fracture of first lumbar vertebra, initial encounter for closed fracture: Secondary | ICD-10-CM | POA: Diagnosis not present

## 2021-01-26 DIAGNOSIS — M4626 Osteomyelitis of vertebra, lumbar region: Secondary | ICD-10-CM

## 2021-01-26 DIAGNOSIS — M4624 Osteomyelitis of vertebra, thoracic region: Secondary | ICD-10-CM | POA: Diagnosis not present

## 2021-01-26 LAB — BASIC METABOLIC PANEL
Anion gap: 9 (ref 5–15)
BUN: 6 mg/dL — ABNORMAL LOW (ref 8–23)
CO2: 24 mmol/L (ref 22–32)
Calcium: 9.5 mg/dL (ref 8.9–10.3)
Chloride: 96 mmol/L — ABNORMAL LOW (ref 98–111)
Creatinine, Ser: 0.82 mg/dL (ref 0.61–1.24)
GFR, Estimated: >60 mL/min (ref >60)
Glucose, Bld: 95 mg/dL (ref 70–99)
Potassium: 3.8 mmol/L (ref 3.5–5.1)
Sodium: 129 mmol/L — ABNORMAL LOW (ref 135–145)

## 2021-01-26 LAB — URINE CULTURE: Culture: >=100000 — AB

## 2021-01-26 LAB — VANCOMYCIN, TROUGH: Vancomycin Tr: 22 ug/mL (ref 15–20)

## 2021-01-26 LAB — VANCOMYCIN, PEAK: Vancomycin Pk: 38 ug/mL (ref 30–40)

## 2021-01-26 LAB — SEDIMENTATION RATE: Sed Rate: 96 mm/hr — ABNORMAL HIGH (ref 0–16)

## 2021-01-26 LAB — C-REACTIVE PROTEIN: CRP: 5.9 mg/dL — ABNORMAL HIGH (ref <1.0)

## 2021-01-26 MED ORDER — SENNA 8.6 MG PO TABS
2.0000 | ORAL_TABLET | Freq: Every day | ORAL | Status: DC
  Administered 2021-01-26 – 2021-01-28 (×3): 17.2 mg via ORAL
  Filled 2021-01-26 (×3): qty 2

## 2021-01-26 MED ORDER — VANCOMYCIN HCL IN DEXTROSE 1-5 GM/200ML-% IV SOLN
1000.0000 mg | INTRAVENOUS | Status: DC
  Administered 2021-01-27 – 2021-01-28 (×2): 1000 mg via INTRAVENOUS
  Filled 2021-01-26 (×2): qty 200

## 2021-01-26 NOTE — Progress Notes (Signed)
Pharmacy Antibiotic Note  Jeffrey [name] is a 70 y.o. male admitted on 01/23/2021 with vertebral osteomyelitis. Pharmacy has been consulted for vancomycin dosing. Today is day 3 of vancomycin. Renal function has been stable. Plan to continue therapy through 11/19 per ID consult. Vancomycin trough drawn at steady state was 22 ug/mL, which calculates to a true trough of 20.8 ug/mL. Goal for vertebral osteomyelitis is 15-20 ug/mL, and true trough is at upper limit of goal.   Plan: Continue vancomycin 750 mg IV every 12 hours. Goal trough 15-20 mcg/mL.  Height: 5\' 8"  (172.7 cm) Weight: 72.1 kg (159 lb) IBW/kg (Calculated) : 68.4  Temp (24hrs), Avg:97.9 F (36.6 C), Min:97.5 F (36.4 C), Max:98.1 F (36.7 C)  Recent Labs  Lab 01/23/21 2020 01/23/21 2200 01/24/21 0210 01/25/21 0311 01/26/21 0729 01/26/21 0913  WBC 15.8*  --  9.9 10.3  --   --   CREATININE 0.93  --  0.82 0.79 0.82  --   LATICACIDVEN 2.4* 1.5  --   --   --   --   VANCOTROUGH  --   --   --   --   --  22*    Estimated Creatinine Clearance: 83.4 mL/min (by C-G formula based on SCr of 0.82 mg/dL).    No Known Allergies  Antimicrobials this admission: Cefepime 10/2 x 1  Vancomycin 10/3>>  Ceftriaxone 10/3>>  Dose adjustments this admission: N/A  Microbiology results: 10/2 BCx: NGTD 10/3 UCx: ESBL E. Coli (asymptomatic - not treating)    Thank you for allowing pharmacy to be a part of this patient's care.  Elsie Amis, PharmD Candidate  01/26/2021 12:05 PM

## 2021-01-26 NOTE — Progress Notes (Signed)
Inpatient Rehabilitation Admissions Coordinator   He is not a candidate for CIR admit as he is supervision 150 feet with straight Cane with therapy today. Other rehab venues need to be pursued. I will alert Claiborne Billings, RN CM.  Danne Baxter, RN, MSN Rehab Admissions Coordinator [phone] 01/26/2021 3:01 PM

## 2021-01-26 NOTE — Progress Notes (Addendum)
PHARMACY CONSULT NOTE FOR:  OUTPATIENT  PARENTERAL ANTIBIOTIC THERAPY (OPAT)  Indication: Vertebral OM/Discitis Regimen:  Ceftriaxone 2g IV q24h Vancomycin 1000 mg IV q24h End date: 03/12/21  IV antibiotic discharge orders are pended. To discharging provider:  please sign these orders via discharge navigator,  Select New Orders & click on the button choice - Manage This Unsigned Work.     Thank you for allowing pharmacy to be a part of this patient's care.  Lestine Box, PharmD PGY2 Infectious Diseases Pharmacy Resident   Please check [email or web address] for unit-specific pharmacy phone numbers

## 2021-01-26 NOTE — Progress Notes (Signed)
FPTS Brief Progress Note  S:Patient awake and watching television upon my entering room for night shift assessment.  Pt reports he has been able to pass one bowel movement but normally has 2 bowel movements per day. He states that he has continued to have pain in his back and leg that has some improvement with pain medication but worries about the constipation that occurs while taking pain meds. He also reports having trapped abdominal gas, denies presence of gas in his chest. He has no other needs at this time and states that he is doing well overall.   O: BP (!) 145/86 (BP Location: Left Arm)   Pulse 88   Temp 98 F (36.7 C) (Oral)   Resp 18   Ht 5\' 8"  (1.727 m)   Wt 72.1 kg   SpO2 97%   BMI 24.18 kg/m   Gen: male appearing stated age, in NAD sitting upright in hospital bed Pulm: normal WOB, stable on RA   A/P: 70 y.o. male admitted for vertebral osteomyelitis receiving IV abx therapy planning for prolonged outpatient abx therapy, doing well, stable vital signs.  - Orders reviewed. Labs for AM ordered, which was adjusted as needed.  - will increase frequency of bowel regimen - will order heating pad to help with passing flatus  Eulis Foster, MD 01/26/2021, 4:14 AM PGY-3, [facility] Night Resident  Please page [phone] with questions.

## 2021-01-26 NOTE — Progress Notes (Signed)
Pharmacy Antibiotic Note  Jeffrey [name] is a 70 y.o. male admitted on 01/23/2021 presenting with lower back pain, outpt MRI with osteo/discitis (T9-10, T12-L1).  Pharmacy has been consulted for vancomycin dosing.  VP: 38 VT: 22 AUC 802.9, supratherapeutic Scr 0.82  Plan: Decrease dose to Vancomycin 1000 mg IV q24h (eAUC 535, goal 400-550) Will monitor renal function, and levels as indicated.   Height: 5\' 8"  (172.7 cm) Weight: 72.1 kg (159 lb) IBW/kg (Calculated) : 68.4  Temp (24hrs), Avg:97.9 F (36.6 C), Min:97.5 F (36.4 C), Max:98.1 F (36.7 C)  Recent Labs  Lab 01/23/21 2020 01/23/21 2200 01/24/21 0210 01/25/21 0311 01/26/21 0729 01/26/21 0913 01/26/21 1422  WBC 15.8*  --  9.9 10.3  --   --   --   CREATININE 0.93  --  0.82 0.79 0.82  --   --   LATICACIDVEN 2.4* 1.5  --   --   --   --   --   VANCOTROUGH  --   --   --   --   --  22*  --   VANCOPEAK  --   --   --   --   --   --  38     Estimated Creatinine Clearance: 83.4 mL/min (by C-G formula based on SCr of 0.82 mg/dL).    No Known Allergies  Lestine Box, PharmD PGY2 Infectious Diseases Pharmacy Resident   Please check [email or web address] for unit-specific pharmacy phone numbers

## 2021-01-26 NOTE — Evaluation (Signed)
Physical Therapy Evaluation Patient Details Name: Jeffrey [name] MRN: [id number] DOB: September 25, 1950 Today's Date: 01/26/2021  History of Present Illness  Jeffrey [name] is a 70 y.o. male  who presented to [facility] hospital on 01/23/2021 after the results of his MRI of his lumbar spine returned, which revealed evidence of discitis and osteomyelitis at T9-T10, and T12-L1 with a compression fracture of L1. The compression fx was known, due to a fall in July. PMHx: HTN, HLD, prostate cancer s/p prostatectomy on 10/15/2020, alcohol use disorder, and chronic hepatitis C.  Clinical Impression  Pt presents to PT with deficits in gait, balance, power, endurance, and with reports of significant back pain radiating to BLE. Pt is able to transfer and ambulate without physical assistance, however does demonstrate LE weakness and mild instability. Pt benefit from UE support to improve balance and will benefit from assessment of gait with use of walker as pt demonstrates preference for use of BUE support during this session when available. Pt will benefit from continued reinforcement of back precautions and brace use during admission, requiring assistance to don brace at this time (declining to attempt without PT assistance). PT recommends discharge home with HHPT when medically ready.       Recommendations for follow up therapy are one component of a multi-disciplinary discharge planning process, led by the attending physician.  Recommendations may be updated based on patient status, additional functional criteria and insurance authorization.  Follow Up Recommendations Home health PT    Equipment Recommendations   (TBD, may benefit from RW)    Recommendations for Other Services       Precautions / Restrictions Precautions Precautions: Fall;Back Precaution Booklet Issued: No Required Braces or Orthoses: Spinal Brace Spinal Brace: Lumbar corset;Applied in sitting position Restrictions Weight Bearing Restrictions: No       Mobility  Bed Mobility Overal bed mobility: Needs Assistance Bed Mobility: Rolling;Sidelying to Sit Rolling: Supervision Sidelying to sit: Min assist       General bed mobility comments: hand hold to elevate from elbow to sitting    Transfers Overall transfer level: Needs assistance Equipment used: Straight cane Transfers: Sit to/from Stand Sit to Stand: Supervision         General transfer comment: verbal cues to scoot to edge of seat prior to standing  Ambulation/Gait Ambulation/Gait assistance: Supervision Gait Distance (Feet): 150 Feet Assistive device: Straight cane Gait Pattern/deviations: Step-through pattern;Wide base of support Gait velocity: reduced Gait velocity interpretation: <1.8 ft/sec, indicate of risk for recurrent falls General Gait Details: pt with slowed step-through gait, widened BOS. Pt utilizing BUE support of cane and PRN use of counter or railing. No significant loss of balance noted.  Stairs            Wheelchair Mobility    Modified Rankin (Stroke Patients Only)       Balance Overall balance assessment: Needs assistance Sitting-balance support: No upper extremity supported;Feet supported Sitting balance-Leahy Scale: Good     Standing balance support: Single extremity supported Standing balance-Leahy Scale: Poor Standing balance comment: reliant on UE support of cane                             Pertinent Vitals/Pain Pain Assessment: 0-10 Pain Score: 10-Worst pain ever Pain Location: back Pain Descriptors / Indicators: Aching Pain Intervention(s): Patient requesting pain meds-RN notified    Home Living Family/patient expects to be discharged to:: Private residence Living Arrangements: Alone Available Help at Discharge: Family;Available PRN/intermittently (  children) Type of Home: House Home Access: Stairs to enter Entrance Stairs-Rails: Right Entrance Stairs-Number of Steps: 3-4 Home Layout: One  level Home Equipment: Cane - single point      Prior Function Level of Independence: Independent with assistive device(s)         Comments: pt uses cane and back brace for mobility     Hand Dominance   Dominant Hand: Right    Extremity/Trunk Assessment   Upper Extremity Assessment Upper Extremity Assessment: Overall WFL for tasks assessed    Lower Extremity Assessment Lower Extremity Assessment: Generalized weakness (pain limiting)    Cervical / Trunk Assessment Cervical / Trunk Assessment: Kyphotic Cervical / Trunk Exceptions: L1 compression fx  Communication   Communication: No difficulties  Cognition Arousal/Alertness: Awake/alert Behavior During Therapy: WFL for tasks assessed/performed;Agitated (pt becomes mildly agitated when PT does not place blanket on the patient in the pt's preferred direction) Overall Cognitive Status: No family/caregiver present to determine baseline cognitive functioning                                 General Comments: pt follows commands well and appears to have good knowledge of gait/mobility deficits.      General Comments General comments (skin integrity, edema, etc.): VSS on RA    Exercises     Assessment/Plan    PT Assessment Patient needs continued PT services  PT Problem List Decreased strength;Decreased activity tolerance;Decreased mobility;Decreased balance;Pain       PT Treatment Interventions Gait training;DME instruction;Stair training;Functional mobility training;Therapeutic activities;Therapeutic exercise;Balance training;Neuromuscular re-education;Patient/family education    PT Goals (Current goals can be found in the Care Plan section)  Acute Rehab PT Goals Patient Stated Goal: to reduce pain PT Goal Formulation: With patient Time For Goal Achievement: 02/09/21 Potential to Achieve Goals: Good    Frequency Min 3X/week   Barriers to discharge        Co-evaluation                AM-PAC PT "6 Clicks" Mobility  Outcome Measure Help needed turning from your back to your side while in a flat bed without using bedrails?: A Little Help needed moving from lying on your back to sitting on the side of a flat bed without using bedrails?: A Little Help needed moving to and from a bed to a chair (including a wheelchair)?: A Little Help needed standing up from a chair using your arms (e.g., wheelchair or bedside chair)?: A Little Help needed to walk in hospital room?: A Little Help needed climbing 3-5 steps with a railing? : A Little 6 Click Score: 18    End of Session Equipment Utilized During Treatment: Back brace Activity Tolerance: Patient limited by pain Patient left: in chair;with call bell/phone within reach;with chair alarm set Nurse Communication: Mobility status PT Visit Diagnosis: Unsteadiness on feet (R26.81);Other abnormalities of gait and mobility (R26.89);Pain;Muscle weakness (generalized) (M62.81) Pain - part of body: Leg    Time: 1694-5038 PT Time Calculation (min) (ACUTE ONLY): 34 min   Charges:   PT Evaluation $PT Eval Low Complexity: 1 Low          Zenaida Niece, PT, DPT Acute Rehabilitation Pager: [phone]   Zenaida Niece 01/26/2021, 11:32 AM

## 2021-01-26 NOTE — Progress Notes (Signed)
   Subjective:    Patient reports pain as moderate.    Objective: Vital signs in last 24 hours: Temp:  [97.5 F (36.4 C)-98.1 F (36.7 C)] 98 F (36.7 C) (10/05 1133) Pulse Rate:  [85-103] 103 (10/05 1133) Resp:  [17-20] 18 (10/05 0942) BP: (124-162)/(76-109) 135/88 (10/05 1133) SpO2:  [97 %-100 %] 100 % (10/05 1133)  Intake/Output from previous day: 10/04 0701 - 10/05 0700 In: 347.7 [IV Piggyback:347.7] Out: -  Intake/Output this shift: No intake/output data recorded.  Recent Labs    01/23/21 2020 01/24/21 0210 01/25/21 0311  HGB 10.9* 9.7* 9.8*   Recent Labs    01/24/21 0210 01/25/21 0311  WBC 9.9 10.3  RBC 3.18* 3.28*  HCT 30.1* 30.3*  PLT 398 389   Recent Labs    01/25/21 0311 01/26/21 0729  NA 127* 129*  K 3.5 3.8  CL 94* 96*  CO2 23 24  BUN 6* 6*  CREATININE 0.79 0.82  GLUCOSE 108* 95  CALCIUM 9.4 9.5   No results for input(s): LABPT, INR in the last 72 hours.  Some LE weakness, can get OOB and walk cane or walker.  [place] EKG SITE RITE  Result Date: 01/25/2021 If Site Rite image not attached, placement could not be confirmed due to current cardiac rhythm.   Assessment/Plan:    Up with therapy, PICC line, walker folding with 5inch front wheels . ROV to see me in 3 wks.   Importance of doing what therapy reminded him about transfers and precautions. Using brace etc.   Jeffrey [name] 01/26/2021, 12:01 PM

## 2021-01-26 NOTE — Discharge Instructions (Addendum)
It was a pleasure providing your care while you were hospitalized. You were treated for vertebral osteomyelitis and discitis, or infection of the bones in your spine. Completion of long term IV antibiotics and close follow up with your primary care doctor, orthopedic doctors, and infectious disease doctors are important to help fully treat this infection. Once antibiotics are completed in November please follow up with home health to have your PICC line removed. Please follow PT recommendations to use brace and use mobility instructions as you were taught by therapy. See your orthopedic surgeon, Dr. Lorin Mercy in 3 wks. Please follow up with your infectious disease Dr. Scharlene Gloss on 10/28 at 10:30 am. Gilford Rile ordered for you to take home. A prescription for opioid medication for back pain will be sent with you. These medications should be used sparingly as they have a high risk of dependence and increase your risk of constipation, rebound pain, and falls. Opioids are not a long term medication. Please follow up with primary care doctor to taper off opioid pain medications. Your blood pressure was high while hospitalized. Please continue taking at home blood pressure medications and follow up with your primary care doctor.  Thank you,  Family Practice Teaching Service

## 2021-01-26 NOTE — Progress Notes (Signed)
Multiple attempts to contact PICC team at [phone] to check on status of PICC insertion.  Patient is medically stable for discharge pending PICC line placement.  Carollee Leitz, MD Family Medicine Residency

## 2021-01-26 NOTE — Progress Notes (Addendum)
[facility]   Reason for visit: Follow up on vertebral osteomyelitis  Interval History: continued back pain, no fever.  No new complaints. Hoping to go home soon to pay bills.   Day 4 vancomycin and ceftriaxone  Physical Exam: Constitutional:  Vitals:   01/26/21 0942 01/26/21 1133  BP: (!) 156/98 135/88  Pulse: 91 (!) 103  Resp: 18   Temp: 98.1 F (36.7 C) 98 F (36.7 C)  SpO2: 100% 100%   patient appears in NAD Respiratory: Normal respiratory effort; CTA B Cardiovascular: RRR GI: soft, nt, nd  Review of Systems: Constitutional: negative for fevers and chills Gastrointestinal: negative for nausea and diarrhea  Lab Results  Component Value Date   WBC 10.3 01/25/2021   HGB 9.8 (L) 01/25/2021   HCT 30.3 (L) 01/25/2021   MCV 92.4 01/25/2021   PLT 389 01/25/2021    Lab Results  Component Value Date   CREATININE 0.82 01/26/2021   BUN 6 (L) 01/26/2021   NA 129 (L) 01/26/2021   K 3.8 01/26/2021   CL 96 (L) 01/26/2021   CO2 24 01/26/2021    Lab Results  Component Value Date   ALT 6 01/24/2021   AST 10 (L) 01/24/2021   GGT 182 (H) 01/07/2018   ALKPHOS 115 01/24/2021     Microbiology: Recent Results (from the past 240 hour(s))  Resp Panel by RT-PCR (Flu A&B, Covid) Nasopharyngeal Swab     Status: None   Collection Time: 01/23/21  7:40 PM   Specimen: Nasopharyngeal Swab; Nasopharyngeal(NP) swabs in vial transport medium  Result Value Ref Range Status   SARS Coronavirus 2 by RT PCR NEGATIVE NEGATIVE Final    Comment: (NOTE) SARS-CoV-2 target nucleic acids are NOT DETECTED.  The SARS-CoV-2 RNA is generally detectable in upper respiratory specimens during the acute phase of infection. The lowest concentration of SARS-CoV-2 viral copies this assay can detect is 138 copies/mL. A negative result does not preclude SARS-Cov-2 infection and should not be used as the sole basis for treatment or other patient management decisions. A negative  result may occur with  improper specimen collection/handling, submission of specimen other than nasopharyngeal swab, presence of viral mutation(s) within the areas targeted by this assay, and inadequate number of viral copies(<138 copies/mL). A negative result must be combined with clinical observations, patient history, and epidemiological information. The expected result is Negative.  Fact Sheet for Patients:  BloggerCourse.com  Fact Sheet for Healthcare Providers:  SeriousBroker.it  This test is no t yet approved or cleared by the [place] FDA and  has been authorized for detection and/or diagnosis of SARS-CoV-2 by FDA under an Emergency Use Authorization (EUA). This EUA will remain  in effect (meaning this test can be used) for the duration of the COVID-19 declaration under Section 564(b)(1) of the Act, 21 U.S.C.section 360bbb-3(b)(1), unless the authorization is terminated  or revoked sooner.       Influenza A by PCR NEGATIVE NEGATIVE Final   Influenza B by PCR NEGATIVE NEGATIVE Final    Comment: (NOTE) The Xpert Xpress SARS-CoV-2/FLU/RSV plus assay is intended as an aid in the diagnosis of influenza from Nasopharyngeal swab specimens and should not be used as a sole basis for treatment. Nasal washings and aspirates are unacceptable for Xpert Xpress SARS-CoV-2/FLU/RSV testing.  Fact Sheet for Patients: BloggerCourse.com  Fact Sheet for Healthcare Providers: SeriousBroker.it  This test is not yet approved or cleared by the [place] FDA and has been authorized for detection  and/or diagnosis of SARS-CoV-2 by FDA under an Emergency Use Authorization (EUA). This EUA will remain in effect (meaning this test can be used) for the duration of the COVID-19 declaration under Section 564(b)(1) of the Act, 21 U.S.C. section 360bbb-3(b)(1), unless the authorization is  terminated or revoked.  Performed at [facility], [address] [place], [place] [address]   Blood culture (routine x 2)     Status: None (Preliminary result)   Collection Time: 01/23/21  8:00 PM   Specimen: BLOOD LEFT HAND  Result Value Ref Range Status   Specimen Description BLOOD LEFT HAND  Final   Special Requests   Final    BOTTLES DRAWN AEROBIC AND ANAEROBIC Blood Culture results may not be optimal due to an inadequate volume of blood received in culture bottles   Culture   Final    NO GROWTH 3 DAYS Performed at [facility], [address], [place], [place] [address]    Report Status PENDING  Incomplete  Urine Culture     Status: Abnormal   Collection Time: 01/24/21  2:10 AM   Specimen: Urine, Clean Catch  Result Value Ref Range Status   Specimen Description URINE, CLEAN CATCH  Final   Special Requests   Final    NONE Performed at [facility], [address], [place], [place] [address]    Culture (A)  Final    >=100,000 COLONIES/mL ESCHERICHIA COLI Confirmed Extended Spectrum Beta-Lactamase Producer (ESBL).  In bloodstream infections from ESBL organisms, carbapenems are preferred over piperacillin/tazobactam. They are shown to have a lower risk of mortality.    Report Status 01/26/2021 FINAL  Final   Organism ID, Bacteria ESCHERICHIA COLI (A)  Final      Susceptibility   Escherichia coli - MIC*    AMPICILLIN >=32 RESISTANT Resistant     CEFAZOLIN >=64 RESISTANT Resistant     CEFEPIME >=32 RESISTANT Resistant     CEFTRIAXONE >=64 RESISTANT Resistant     CIPROFLOXACIN <=0.25 SENSITIVE Sensitive     GENTAMICIN <=1 SENSITIVE Sensitive     IMIPENEM <=0.25 SENSITIVE Sensitive     NITROFURANTOIN <=16 SENSITIVE Sensitive     TRIMETH/SULFA <=20 SENSITIVE Sensitive     AMPICILLIN/SULBACTAM >=32 RESISTANT Resistant     PIP/TAZO <=4 SENSITIVE Sensitive     * >=100,000 COLONIES/mL ESCHERICHIA COLI    Impression/Plan:  1. Vertebral osteomyelitis - on  broad coverage and negative blood cultures to date, nearly 72 hours.  Plan for 6 weeks of antibiotics through 03/12/21.    Ok for picc line and discharge from ID standpoint  Diagnosis: Vertebral osteomyelitis  Culture Result: negative  No Known Allergies  OPAT Orders Discharge antibiotics to be given via PICC line Discharge antibiotics: vancomycin 750 mg IV every 12 hours + ceftriaxone 2 grams IV every 24 hours. Per pharmacy protocol yes Aim for Vancomycin trough 15-20 or AUC 400-550 (unless otherwise indicated) Duration: 6 weeks End Date: 03/12/21  [facility] Care Per Protocol: yes  Home health RN for IV administration and teaching; PICC line care and labs.    Labs weekly while on IV antibiotics: __x CBC with differential __ BMP __x CMP __x CRP _x_ ESR _x_ Vancomycin trough __ CK  _x_ Please pull PIC at completion of IV antibiotics __ Please leave PIC in place until doctor has seen patient or been notified  Fax weekly labs to [phone]  Clinic Follow Up Appt: 02/18/21  @ 10:30 with Dr. Luciana Axe

## 2021-01-26 NOTE — Progress Notes (Addendum)
Family Medicine Teaching Service Daily Progress Note Intern Pager: [phone]  Patient name: Jeffrey [name] Medical record number: [id number] Date of birth: 1950/05/03 Age: 70 y.o. Gender: male  Primary Care Provider: Carollee Leitz, MD Consultants: Orthopedic Surgery and Infectious Disease   Code Status: Full Code  [name] PMH is significant for hypertension, history of prostate cancer s/p radical prostatectomy (10/15/2020), chronic alcohol use disorder, cataracts, dyslipidemia, history of hepatitis C & hepatic cysts.   Vertebral osteomyelitis and discitis T9-T10, T12-L1 and spinal stenosis throughout the thoracic spine  Infectious disease consulted and saw the patient yesterday. Recommended PICC and prolonged treatment for 6 weeks through November 19th and follow up outpatient with their clinic. Continue Vancomycin and Ceftriaxone. Scheduled for PICC today. OT recommend CIR for dispo. Patient's preference is home with home health. He would like to think about scheduling vs as needed pain medication.  - Infectious disease consulted, appreciate their care and recommendations  - Orthopedic surgery (Dr. Rodell Perna) following, appreciate their care and recommendations - Continue IV Vancomycin, dosage per pharmacy consult  - Continue IV Ceftriaxone 2g q24 hours - Continue Oxycodone 10mg  q3 hours PO PRN for moderate pain - Continue Dilaudid 1mg  q 4 hours PO PRN for severe or breakthrough pain, can discontinue on discharge  - Hydroxyzine 10 mg PRN for pruritis  - Vital per unit protocol  - Up with assistance - PT evaluate and treat - AM CBC, CMP   Hypertension Has remained HTN but improving last BP 139/76,  SBP (124-162) and DBP (76-109). Home medications include amlodipine 10 mg daily and losartan 100 mg daily. Given patient's age, target blood pressure will continue with current medications and continue to monitor. Hydroxyzine for pruritis may be beneficial for HTN. Increase in BP may be related to pain or alcohol withdrawal.  - Continue home Losartan 100 mg daily and Amlodipine 10 mg daily  - Monitor BP with VS  - Follow up PCP outpatient   Chronic hyponatremia Chronic hyponatremia most likely secondary to alcohol use disorder. Sodium ranging from 127 to 138 since 2016. Today corrected sodium of 129.  Serum osmolality 274 mildly low to normal. Normal urine osmolality at 478. Urine sodium is 100. Slightly hypotonic hyponatremic with normal GFR and no use of thiazides, no peripheral edema, and no signs of hypovolemia. Most likely isoosmolar hyponatremia related to recent prostectomy. Could consider morning cortisol and ACTH stimulation test to further workup hypotonic hyponatremic. Since improving, will CTM for now.  - CTM - Follow up outpatient PCP    Constipation Positive for bowel movement on 10/4. 10/3: KUB showed no acute abnormality.  - Miralax X 17 g daily BID  - Senna 2 tablet daily   Alcohol Use Disorder Noticed tremor but attributed to being cold and denies cravings. However, is agitated. Highest CIWA was 1. Patient drinks 3-4 drinks daily with a total of 42 weekly.  No history of DT or withdrawal seizures.Last drink at 1800 on 01/22/21. - CIWA protocol without Ativan for now - Continue Thiamine 100 mg PO daily - Continue Multivitamin tablet daily  Radiculopathy in bilateral lower extremities Intermittent tingling in bilateral feet that feels "like pins and needles." Most likely secondary to spinal stenosis.  - Continue Gabapentin  100 mg TID   Chronic Hepatitis C, Genotype 1 Positive hepatitis C in early 2000's.  No history of IV drug abuse, continues to deny this on admission. In  2017, test result for hepatitis C RNA by PCR was positive.  He completed 8 weeks of [name] treatment.  He had work-up to include liver staging with elastography which showed no cirrhosis and only F2 to F3 fibrosis.  Today, AST 10 and ALT 6. - Avoid hepatotoxic medications   Tobacco use disorder Smokes ~8 cigarettes per day for decades.  - Discontinue Nicotine patch 7 mg    Hepatic and renal cysts Chronic. Seen on CT imaging in 2015. Creatinine 0.79, BUN 6.    History of localized prostate cancer S/p robotic assisted laparoscopic radical prostatectomy on 10/15/2020 by Dr. Alexis Frock with urethral catheter on discharge for 1 week.   - Patient to continue with outpatient urology follow up    History of dyslipidemia Patient has history of dyslipidemia. He was evaluated for this by cardiology in 2021.  Believed that etiology is his longstanding alcohol abuse and the effects on the liver affecting cholesterol regulation.  No therapy or testing recommended at that time as he was not at high risk for heart disease. Most recent lipid panel in 2021 wnl.  - No further intervention at this time  - Follow up with PCP    History of benign neoplasm of colon, s/p right colectomy Patient had an open right partial colectomy for polyps with primary anastomosis in June 2012 in [place].  Patient is supposed to have colonoscopies every 2 years. Last colonoscopy in 2018 with removal of sessile polyp.  - Will recommend maintaining outpatient schedule   Circadian Rhythm Regulation   Continue Melatonin 3 mg nightly.   Delirium Prevention Protocol Although this patient is not currently delirious he is at greater risk given his age and deliriogenic medications. - RN to open blinds every morning - To bedside: glasses, hearing aide, patient's own shoes. Make available to patient's when possible and encourage use - RN to assess orientation to person, place and time qam and prn, with frequent reorientation and  introduction of caregivers, with date written on whiteboard and clock in room. - Recommend extended visiting hours with familiar family/friends as feasible - Encourage normal sleep-wake cycle by promoting a dark, quiet environment at night and stimulating, light environment during the day. - Turn the TV off when patient is asleep or not in use. - Avoid deliriogenic medications (opiates, benzodiazepines, antihistamines), as medically able    FEN/GI: Cardiac Diet  PPx: Lovenox  Dispo:Pending PT recommendations  pending clinical improvement . OT recommend CIR. Barriers include orthopedic recommendations.   Subjective:  Patient resting in chair beside bed today. He is visibility tremulous in his lower extremities and hands. He attributes it to being cold and asks for an extra blanket. Not craving alcohol. Says that the pain is usually 6/10 and the oxycodone has been helpful. He would like time to think through where he would like the pain medication scheduled or not. He does not want the nicotine patch. Would like to go home with homehealth. Eating and sleeping well. Thinks the food is good. No other physical complaints at this time.   Objective: Temp:  [97.5 F (36.4 C)-98 F (36.7 C)] 97.9 F (36.6 C) (10/05 0423) Pulse Rate:  [85-92] 88 (10/05 0423) Resp:  [17-20] 18 (10/05 0423) BP: (124-162)/(76-109) 139/76 (10/05 0423) SpO2:  [97 %-100 %] 100 % (10/05 0423)  Physical Exam: Physical Exam  Gen: AOx3, in NAD HEENT: NCAT, no LAD, sclera anicteric, cloudy lenses, Edentulous upper teeth with one golden tooth, poor dentition. Mucous membranes moist. CV: RRR, no mgr, 1/2+ bilateral dorsalis pedis Lung: CTAB, normal WOB Abd: NT, ND, +BS Neuro: CN II-XII grossly intact, Aox4  Skin: warm, dry, no rashes or open lesions noted  Psych: frustrated, agitated, easy to anger. Linear and logical.   Laboratory: Recent Labs  Lab 01/23/21 2020 01/24/21 0210 01/25/21 0311  WBC 15.8* 9.9 10.3  HGB  10.9* 9.7* 9.8*  HCT 33.7* 30.1* 30.3*  PLT PLATELET CLUMPS NOTED ON SMEAR, UNABLE TO ESTIMATE 398 389   Recent Labs  Lab 01/23/21 2020 01/24/21 0210 01/25/21 0311 01/26/21 0729  NA 130* 129* 127* 129*  K 3.6 3.6 3.5 3.8  CL 97* 100 94* 96*  CO2 19* 21* 23 24  BUN 7* 8 6* 6*  CREATININE 0.93 0.82 0.79 0.82  CALCIUM 10.0 9.5 9.4 9.5  PROT 8.4* 7.3  --   --   BILITOT 0.4 0.7  --   --   ALKPHOS 118 115  --   --   ALT 6 6  --   --   AST 14* 10*  --   --   GLUCOSE 95 101* 108* 95    Imaging/Diagnostic Tests: [place] EKG SITE RITE  Result Date: 01/25/2021 If Site Rite image not attached, placement could not be confirmed due to current cardiac rhythm.    Silver Huguenin, Medical Student 01/26/2021, 8:18 AM [place] Intern pager: [phone], text pages welcome   I was personally present and re-performed the exam and medical decision making and verified the service and findings are accurately documented in the student's note.  Alcus Dad, MD 01/26/2021 2:21 PM

## 2021-01-26 NOTE — TOC Initial Note (Signed)
Transition of Care [facility]) - Initial/Assessment Note    Patient Details  Name: Jeffrey [name] MRN: [id number] Date of Birth: [birth year]  [facility] CM/SW Contact:    Pollie Friar, RN Phone Number: 01/26/2021, 11:02 AM  Clinical Narrative:                 Patient states he lives at home alone. His daughter and son work during the day and would not be available to assist at home.  Pt states he was driving himself prior to admission.  Pt was responsible for his home medications and denies any issues.  CM inquired about home antibiotics and someone to assist. Pt currently doesn't have any assist at home. CM inquired about SNF if doesn't qualify for CIR. Pt states he needs to think about it and asked that I come back tomorrow. He states he needs to see how he progresses to decide.  TOC following.  Expected Discharge Plan: IP Rehab Facility Barriers to Discharge: Continued Medical Work up   Patient Goals and CMS Choice     Choice offered to / list presented to : Patient  Expected Discharge Plan and Services Expected Discharge Plan: [place]   Discharge Planning Services: CM Consult Post Acute Care Choice: IP Rehab Living arrangements for the past 2 months: Single Family Home                                      Prior Living Arrangements/Services Living arrangements for the past 2 months: Single Family Home Lives with:: Self Patient language and need for interpreter reviewed:: Yes Do you feel safe going back to the place where you live?: Yes      Need for Family Participation in Patient Care: Yes (Comment) Care giver support system in place?: No (comment) Current home services: DME (cane) Criminal Activity/Legal Involvement Pertinent to Current Situation/Hospitalization: No - Comment as needed  Activities of Daily Living      Permission Sought/Granted                  Emotional Assessment Appearance:: Appears stated  age Attitude/Demeanor/Rapport: Engaged Affect (typically observed): Apprehensive Orientation: : Oriented to Self, Oriented to Place, Oriented to  Time, Oriented to Situation   Psych Involvement: No (comment)  Admission diagnosis:  Constipation [K59.00] Abdominal pain [R10.9] Osteomyelitis of thoracic spine ([place]) [M46.24] Patient Active Problem List   Diagnosis Date Noted   Constipation    Sepsis ([place]) 01/24/2021   Neuropathic pain of both legs 01/24/2021   Osteomyelitis of lumbar spine ([place]) 01/24/2021   Osteomyelitis of thoracic spine ([place]) 01/23/2021   Foot swelling 12/07/2020   Compression fracture of L1 lumbar vertebra ([place]) 12/05/2020   Cellulitis of left lower extremity 12/05/2020   Bilateral low back pain without sciatica 11/26/2020   Prostate cancer ([place]) 10/15/2020   Nocturia 05/14/2020   High risk heterosexual behavior 06/15/2019   Tobacco use 10/12/2018   Acid reflux 07/25/2018   Rash and nonspecific skin eruption 03/06/2018   Liver fibrosis 02/13/2018   Tubular adenoma 12/26/2016   Left hip pain 11/26/2016   History of partial colectomy 05/18/2016   Erectile dysfunction 05/18/2016   Episodic recurrent vertigo 01/20/2016   Chronic hepatitis C without hepatic coma ([place]) 06/23/2015   Essential hypertension 04/12/2015   Arthritis of knee 04/12/2015   Alcohol abuse 04/12/2015   PCP:  Carollee Leitz, MD Pharmacy:  [facility] [address] [place] [address] Phone: [phone] Fax: [phone]     Social Determinants of Health (SDOH) Interventions    Readmission Risk Interventions No flowsheet data found.

## 2021-01-26 NOTE — Progress Notes (Signed)
Arrived to discuss PICC insertion. Stated he was ready to go to sleep and wanted to wait until 01-27-21 AM. Primary RN Collie Siad notified. VAST to follow up 01-27-21 AM.

## 2021-01-27 DIAGNOSIS — M4624 Osteomyelitis of vertebra, thoracic region: Secondary | ICD-10-CM | POA: Diagnosis not present

## 2021-01-27 DIAGNOSIS — M4626 Osteomyelitis of vertebra, lumbar region: Secondary | ICD-10-CM | POA: Diagnosis not present

## 2021-01-27 LAB — BASIC METABOLIC PANEL
Anion gap: 8 (ref 5–15)
BUN: <5 mg/dL — ABNORMAL LOW (ref 8–23)
CO2: 25 mmol/L (ref 22–32)
Calcium: 9.5 mg/dL (ref 8.9–10.3)
Chloride: 97 mmol/L — ABNORMAL LOW (ref 98–111)
Creatinine, Ser: 0.83 mg/dL (ref 0.61–1.24)
GFR, Estimated: >60 mL/min (ref >60)
Glucose, Bld: 88 mg/dL (ref 70–99)
Potassium: 3.8 mmol/L (ref 3.5–5.1)
Sodium: 130 mmol/L — ABNORMAL LOW (ref 135–145)

## 2021-01-27 MED ORDER — CHLORHEXIDINE GLUCONATE CLOTH 2 % EX PADS
6.0000 | MEDICATED_PAD | Freq: Every day | CUTANEOUS | Status: DC
  Administered 2021-01-27 – 2021-01-28 (×2): 6 via TOPICAL

## 2021-01-27 MED ORDER — SODIUM CHLORIDE 0.9% FLUSH
10.0000 mL | Freq: Two times a day (BID) | INTRAVENOUS | Status: DC
Start: 2021-01-27 — End: 2021-01-28
  Administered 2021-01-27 – 2021-01-28 (×2): 10 mL

## 2021-01-27 MED ORDER — SODIUM CHLORIDE 0.9% FLUSH: 10.0000 mL | INTRAVENOUS | Status: DC | PRN

## 2021-01-27 NOTE — Progress Notes (Addendum)
Family Medicine Teaching Service Daily Progress Note Intern Pager: [phone]  Patient name: Jeffrey [name] Medical record number: [id number] Date of birth: 1951/01/21 Age: 70 y.o. Gender: male  Primary Care Provider: Carollee Leitz, MD Consultants: Orthopedic Surgery and Infectious Disease   Code Status: Full Code  [name] PMH is significant for hypertension, history of prostate cancer s/p radical prostatectomy (10/15/2020), chronic alcohol use disorder, cataracts, dyslipidemia, history of hepatitis C & hepatic cysts.   Vertebral osteomyelitis and discitis T9-T10, T12-L1 and spinal stenosis throughout the thoracic spine  Per infectious disease, PICC recommended and prolonged treatment for 6 weeks through November 19th and follow up outpatient with their clinic. Continue Vancomycin and Ceftriaxone. PICC placed today with no issues. Patient's preference is home with home health. Case manger is working with home health for dispo plans.  - Infectious disease following, appreciate their care and recommendations  - Orthopedic surgery (Dr. Rodell Perna) following, appreciate their care and recommendations - Continue IV Vancomycin 1000 mg IV q24h  - Continue IV Ceftriaxone 2g q24 hours - Continue Oxycodone 10mg  q3 hours PO PRN for moderate pain - Hydroxyzine 10 mg PRN for pruritis, can discontinue on discharge  - Vital per unit protocol  - Up with assistance - PT evaluate and treat - AM CBC, CMP   Hypertension Has remained HTN SBP (127-168) and DBP (76-104). Home medications include amlodipine 10 mg daily and losartan 100 mg daily. Given patient's age, target blood pressure will continue with current medications  and continue to monitor. Hydroxyzine for pruritis may be beneficial for HTN. Increase in BP may be related to pain or mild alcohol withdrawal.  - Continue home Losartan 100 mg daily and Amlodipine 10 mg daily  - Monitor BP with VS  - Follow up PCP outpatient   Chronic hyponatremia Chronic hyponatremia most likely secondary to alcohol use disorder. Sodium ranging from 127 to 138 since 2016. Today corrected sodium 130. Since improving, will CTM for now.  - CTM - Follow up outpatient PCP    Constipation Positive for bowel movement on 10/4. 10/3: KUB showed no acute abnormality.  - Miralax X 17 g daily BID  - Senna 2 tablet daily   Alcohol Use Disorder Out of the timeframe for severe, acute withdrawal symptoms. Highest CIWA while hospitalized is 1. Patient drinks 3-4 drinks daily with a total of 42 weekly. No history of DT or withdrawal seizures. Last drink at 1800 on 01/22/21. Discontinue CIWA.  - CIWA protocol without Ativan  - Continue Thiamine 100 mg PO daily - Continue Multivitamin tablet daily  Radiculopathy in bilateral lower extremities Intermittent tingling in bilateral feet that feels "like pins and needles." Most likely secondary to spinal stenosis. Gabapentin has been helpful.  - Continue Gabapentin 100 mg TID   Chronic Hepatitis C, Genotype 1 Positive hepatitis C in early 2000's.  No history of IV drug abuse, continues to deny this on admission. In 2017, test result for hepatitis C RNA by PCR was positive.  He completed 8 weeks of [name] treatment.  He had work-up to include liver staging with elastography which showed no cirrhosis and only F2 to F3 fibrosis.   - Avoid hepatotoxic medications   Tobacco use disorder Smokes ~8 cigarettes  per day for decades.  - Discontinue Nicotine patch 7 mg    Hepatic and renal cysts Chronic. Seen on CT imaging in 2015. Creatinine 0.79, BUN 6.    History of localized prostate cancer S/p robotic assisted laparoscopic radical prostatectomy  on 10/15/2020 by Dr. Alexis Frock with urethral catheter on discharge for 1 week.   - Patient to continue with outpatient urology follow up    History of dyslipidemia Patient has history of dyslipidemia. He was evaluated for this by cardiology in 2021.  Believed that etiology is his longstanding alcohol abuse and the effects on the liver affecting cholesterol regulation.  No therapy or testing recommended at that time as he was not at high risk for heart disease. Most recent lipid panel in 2021 wnl.  - No further intervention at this time  - Follow up with PCP    History of benign neoplasm of colon, s/p right colectomy Patient had an open right partial colectomy for polyps with primary anastomosis in June 2012 in [place].  Patient is supposed to have colonoscopies every 2 years. Last colonoscopy in 2018 with removal of sessile polyp.  - Will recommend maintaining outpatient schedule   Circadian Rhythm Regulation   Continue Melatonin 3 mg nightly.   Delirium Prevention Protocol Although this patient is not currently delirious he is at greater risk given his age and deliriogenic medications. - RN to open blinds every morning - To bedside: glasses, hearing aide, patient's own shoes. Make available to patient's when possible and encourage use - RN to assess orientation to person, place and time qam and prn, with frequent reorientation and introduction of caregivers, with date written on whiteboard and clock in room. - Recommend extended visiting hours with familiar family/friends as feasible - Encourage normal sleep-wake cycle by promoting a dark, quiet environment at night and stimulating, light environment during the day. - Turn the TV off when patient is asleep or not in use. - Avoid deliriogenic medications (opiates, benzodiazepines, antihistamines), as medically able    FEN/GI: Cardiac Diet  PPx: Lovenox  Dispo:Pending PT recommendations  pending clinical improvement . OT recommend CIR.  Barriers include orthopedic recommendations.   Subjective:  Patient received the PICC line with no issues. He states he still has back pain but medication is helpful. Has been able to move with assistance. No other physical complaints at this time. His preference is to return home today.   Objective: Temp:  [98 F (36.7 C)-98.9 F (37.2 C)] 98.9 F (37.2 C) (10/06 0450) Pulse Rate:  [85-103] 89 (10/06 0450) Resp:  [16-20] 20 (10/06 0450) BP: (127-168)/(88-104) 168/104 (10/06 0450) SpO2:  [99 %-100 %] 99 % (10/06 0450) Physical Exam: Physical Exam  Gen: AOx3, in NAD HEENT: NCAT, no LAD, sclera anicteric, cloudy lenses, Edentulous upper teeth with one golden tooth, poor dentition. Mucous membranes moist. CV: RRR, no mgr, 1/2+ bilateral dorsalis pedis Lung: CTAB, normal WOB Abd: NT, ND, +BS Neuro: CN II-XII grossly intact, Aox4  Skin: warm, dry, no rashes or open lesions noted  Psych: frustrated, agitated, easy to anger. Linear and logical.   Laboratory: Recent Labs  Lab 01/23/21 2020 01/24/21 0210 01/25/21 0311  WBC 15.8* 9.9 10.3  HGB 10.9* 9.7* 9.8*  HCT 33.7* 30.1* 30.3*  PLT PLATELET CLUMPS NOTED ON SMEAR, UNABLE TO ESTIMATE 398 389   Recent Labs  Lab 01/23/21 2020 01/24/21 0210 01/25/21 0311 01/26/21 0729 01/27/21 0310  NA 130* 129* 127* 129* 130*  K 3.6 3.6 3.5 3.8  3.8  CL 97* 100 94* 96* 97*  CO2 19* 21* 23 24 25   BUN 7* 8 6* 6* <5*  CREATININE 0.93 0.82 0.79 0.82 0.83  CALCIUM 10.0 9.5 9.4 9.5 9.5  PROT 8.4* 7.3  --   --   --   BILITOT 0.4 0.7  --   --   --   ALKPHOS 118 115  --   --   --   ALT 6 6  --   --   --   AST 14* 10*  --   --   --   GLUCOSE 95 101* 108* 95 88    Imaging/Diagnostic Tests: No results found.    Silver Huguenin, Medical Student 01/27/2021 7:24 AM   FPTS Upper-Level Resident Addendum   I have independently interviewed and examined the patient. I have discussed the above with the original author and agree with their  documentation.  Please see also any attending notes.    Carollee Leitz, MD PGY-3, [place] [facility] 01/27/2021 1:51 PM  [place] Service pager: [phone] (text pages welcome through [facility])

## 2021-01-27 NOTE — Progress Notes (Signed)
Occupational Therapy Treatment Patient Details Name: Jeffrey [name] MRN: [id number] DOB: 1951-03-21 Today's Date: 01/27/2021   History of present illness Huey Scalia is a 70 y.o. male  who presented to [facility] on 01/23/2021 after the results of his MRI of his lumbar spine returned, which revealed evidence of discitis and osteomyelitis at T9-T10, and T12-L1 with a compression fracture of L1. The compression fx was known, due to a fall in July. PMHx: HTN, HLD, prostate cancer s/p prostatectomy on 10/15/2020, alcohol use disorder, and chronic hepatitis C.   OT comments  Pt significantly limited by pain this date.  He was only able to tolerate standing for less than 30 seconds before having to return to supine.  He required total A to don brace secondary to pain.  Reviewed precautions and safety with LB ADLs.  Recommend SNF.    Recommendations for follow up therapy are one component of a multi-disciplinary discharge planning process, led by the attending physician.  Recommendations may be updated based on patient status, additional functional criteria and insurance authorization.    Follow Up Recommendations  SNF    Equipment Recommendations  3 in 1 bedside commode    Recommendations for Other Services      Precautions / Restrictions Precautions Precautions: Fall;Back Precaution Comments: reviewed back precautions with pt Required Braces or Orthoses: Spinal Brace Spinal Brace: Lumbar corset;Applied in sitting position       Mobility Bed Mobility Overal bed mobility: Needs Assistance Bed Mobility: Rolling;Sidelying to Sit;Sit to Sidelying Rolling: Supervision Sidelying to sit: Min guard     Sit to sidelying: Min guard General bed mobility comments: heavy reliance on bed rail and increased time    Transfers Overall transfer level: Needs assistance Equipment used: Straight cane Transfers: Sit to/from Stand Sit to Stand: Min guard         General transfer comment: min  guard for safety.  He was unable to maintain standing for > 30 seconds before having to sit due to severe pain    Balance Overall balance assessment: Needs assistance Sitting-balance support: No upper extremity supported;Feet supported Sitting balance-Leahy Scale: Fair     Standing balance support: Single extremity supported Standing balance-Leahy Scale: Poor Standing balance comment: reliant on UE support of cane                           ADL either performed or assessed with clinical judgement   ADL Overall ADL's : Needs assistance/impaired                                       General ADL Comments: Pt unable to perform ADLs this date due to severity of pain.  Reviewed precautions and safe methods for LB ADLs.  He reports he dons socks, shoes, and pants supine at home     Vision       Perception     Praxis      Cognition Arousal/Alertness: Awake/alert Behavior During Therapy: [facility] for tasks assessed/performed;Agitated;Anxious Overall Cognitive Status: No family/caregiver present to determine baseline cognitive functioning                                 General Comments: pt wtih decreased carry over of precautions        Exercises     Shoulder  Instructions       General Comments reinforced recommendation for SNF at discharge    Pertinent Vitals/ Pain       Pain Assessment: 0-10 Pain Score: 9  Pain Location: back Pain Descriptors / Indicators: Grimacing;Guarding Pain Intervention(s): Repositioned;Monitored during session;Limited activity within patient's tolerance  Home Living                                          Prior Functioning/Environment              Frequency  Min 2X/week        Progress Toward Goals  OT Goals(current goals can now be found in the care plan section)  Progress towards OT goals: Not progressing toward goals - comment (pain)  Acute Rehab OT Goals Patient  Stated Goal: to reduce pain OT Goal Formulation: With patient Time For Goal Achievement: 02/10/21 Potential to Achieve Goals: [place] Discharge plan needs to be updated    Co-evaluation                 AM-PAC OT "6 Clicks" Daily Activity     Outcome Measure   Help from another person eating meals?: None Help from another person taking care of personal grooming?: A Little Help from another person toileting, which includes using toliet, bedpan, or urinal?: A Lot Help from another person bathing (including washing, rinsing, drying)?: A Lot Help from another person to put on and taking off regular upper body clothing?: A Lot Help from another person to put on and taking off regular lower body clothing?: A Lot 6 Click Score: 15    End of Session Equipment Utilized During Treatment: Other (comment) (SPC)  OT Visit Diagnosis: Unsteadiness on feet (R26.81);Other abnormalities of gait and mobility (R26.89);History of falling (Z91.81);Muscle weakness (generalized) (M62.81);Pain Pain - part of body:  (back)   Activity Tolerance Patient tolerated treatment well;Treatment limited secondary to agitation   Patient Left in bed;with call bell/phone within reach   Nurse Communication Mobility status        Time: 8453-6468 OT Time Calculation (min): 14 min  Charges: OT General Charges $OT Visit: 1 Visit OT Treatments $Therapeutic Activity: 8-22 mins  Nilsa Nutting., OTR/L Acute Rehabilitation Services Pager [phone] Office [phone]   Lucille Passy M 01/27/2021, 5:11 PM

## 2021-01-27 NOTE — Progress Notes (Signed)
Peripherally Inserted Central Catheter Placement  The IV Nurse has discussed with the patient and/or persons authorized to consent for the patient, the purpose of this procedure and the potential benefits and risks involved with this procedure.  The benefits include less needle sticks, lab draws from the catheter, and the patient may be discharged home with the catheter. Risks include, but not limited to, infection, bleeding, blood clot (thrombus formation), and puncture of an artery; nerve damage and irregular heartbeat and possibility to perform a PICC exchange if needed/ordered by physician.  Alternatives to this procedure were also discussed.  Bard Power PICC patient education guide, fact sheet on infection prevention and patient information card has been provided to patient /or left at bedside.    PICC Placement Documentation  PICC Single Lumen 01/27/21 Right Brachial 42 cm 1 cm (Active)  Indication for Insertion or Continuance of Line Home intravenous therapies (PICC only) 01/27/21 0920  Exposed Catheter (cm) 0 cm 01/27/21 0920  Site Assessment Clean;Dry;Intact 01/27/21 0920  Line Status Flushed;Blood return noted;Saline locked 01/27/21 0920  Dressing Type Transparent 01/27/21 0920  Dressing Status Clean;Dry;Intact 01/27/21 0920  Antimicrobial disc in place? Yes 01/27/21 0920  Dressing Change Due 02/03/21 01/27/21 0920       [name] 01/27/2021, 9:47 AM

## 2021-01-27 NOTE — Progress Notes (Signed)
Peripherally Inserted Central Catheter Placement  The IV Nurse has discussed with the patient and/or persons authorized to consent for the patient, the purpose of this procedure and the potential benefits and risks involved with this procedure.  The benefits include less needle sticks, lab draws from the catheter, and the patient may be discharged home with the catheter. Risks include, but not limited to, infection, bleeding, blood clot (thrombus formation), and puncture of an artery; nerve damage and irregular heartbeat and possibility to perform a PICC exchange if needed/ordered by physician.  Alternatives to this procedure were also discussed.  Bard Power PICC patient education guide, fact sheet on infection prevention and patient information card has been provided to patient /or left at bedside.    PICC Placement Documentation  PICC Single Lumen 01/27/21 Right Brachial 42 cm 1 cm (Active)  Indication for Insertion or Continuance of Line Home intravenous therapies (PICC only) 01/27/21 0920  Exposed Catheter (cm) 0 cm 01/27/21 0920  Site Assessment Clean;Dry;Intact 01/27/21 0920  Line Status Flushed;Blood return noted;Saline locked 01/27/21 0920  Dressing Type Transparent 01/27/21 0920  Dressing Status Clean;Dry;Intact 01/27/21 0920  Antimicrobial disc in place? Yes 01/27/21 0920  Dressing Change Due 02/03/21 01/27/21 0920       [name] 01/27/2021, 9:44 AM

## 2021-01-27 NOTE — Discharge Summary (Signed)
[facility] Discharge Summary  Patient name: Jeffrey [name] Medical record number: [id number] Date of birth: 05/06/1950 Age: 70 y.o. Gender: male Date of Admission: 01/23/2021  Date of Discharge: 01/28/21 Admitting Physician: Dickie La, MD  Primary Care Provider: Carollee Leitz, MD Consultants: Orthopedic Surgery, Infectious Disease, PT/OT   Indication for Hospitalization:  Vertebral osteomyelitis and discitis of T9-T10, T12-L1  Discharge Diagnoses/Problem List:  HTN  Hyponatremia  Alcohol Use Disorder  Prostate cancer s/p radical prostatectomy Radiculopathy in bilateral lower extremities Chronic Hepatitis C, Genotype 1 Tobacco use disorder  Disposition: home with home health   Discharge Condition: stable   Discharge Exam:  Physical Exam  Gen: AOx4, in NAD, elderly male  HEENT: NCAT, no LAD, sclera anicteric, cloudy lenses, Edentulous upper teeth with one golden tooth, poor dentition. Mucous membranes moist. CV: RRR, no mgr Lung: CTAB, normal WOB Abd: NT/ND, +BS Neuro: CN II-XII grossly intact Motor: Tone is normal. Bulk is normal. 4/5 strength was present in all four extremities.  Sensory: Sensation is symmetric to light touch in the arms and legs. Skin: warm, dry, no rashes noted   Brief Hospital Course:  Jeffrey [name] is a 70 year old male with a history of hypertension, prostate cancer s/p radical prostatectomy, chronic alcohol use disorder, cataracts, dyslipidemia, history of hepatitis C and hepatic cysts who was admitted for acute vertebral osteomyelitis and discitis. Hospital course as outlined below:  Osteomyelitis and discitis of T9-T10, T12-L1 SEPSIS without acute organ dysfunction Compression fracture lumbar vertebrae Patient presented after outpatient MRI lumbar spine showed osteomyelitis. MRI was done as workup for increasing back pain in the setting of L1 compression fracture in July 2022. On arrival, patient was afebrile but  tachycardic and tachypneic, with leukocytosis to 15.8 and lactic acid 2.4. Blood cultures were negative. Empiric antibiotic regimen started of vancomycin and ceftriaxone for coverage of MRSA. Orthopedic surgery consulted and did not recommend procedure at this time. They will follow up outpatient. Infectious disease consulted and recommended long term course of vancomycin 750 mg IV every 12 hours + ceftriaxone 2 grams IV every 24 hours for 6 weeks with weekly labs (CBC with diff, CMP, CRP, ESR, vancomycin trough). Received Oxycodone and Dilaudid for pain control. Sent home with oxycodone only. PT recommended home health PT, back brace, and walker.  Neuropathic painbilateral lower extremities secondary to compression fracture and osteomyelitis Patient complained of intermittent tingling in bilateral feet. Unlikely to be a diabetic neuropathy given Hgb A1c low at 4.5. Neuropathy could be due to vitamin deficiency in the setting of his alcohol use disorder or secondary to spinal stenosis in context of osteomyelitis. Gabapentin 100mg  TID initiated and was helpful.   Asymptomatic Bacteriuria Urine culture was obtained as part of sepsis workup and showed 100,000 CFU E Coli but he was not treated as he was asymptomatic (and was already on abx for vertebral osteomyelitis).  Hypertension Patient mildly hypertensive throughout admission (299M-426S systolic).  Increase in BP may have been related to pain or mild alcohol withdrawal. He was continued on his home meds without changes (Losartan 100 mg daily and Amlodipine 10 mg daily)  Anemia Hemoglobin down to 8.1 at discharge from 10.9 on admission and 13.7 in June. Could be attributed to alcohol use disorder, his hx of hep C, multiple lab draws. Since there were no signs or symptoms of anemia on physical exam or during the interview and patient remained above transfusion threshold, on additional workup was pursued while inpatient.   Alcohol Use Disorder  CIWA  scores were 0 to 1. Per patient, no history of withdrawal or DT. Not interested in cessation or further treatment at this time. Did not require ativan while hospitalized.   Chronic hyponatremia Sodium stable between 127-130 throughout admission. Serum osmolality and urine studies suggestive of SIADH. No additional workup pursued while inpatient.  Issues to Follow Up:  Ensure outpatient follow up with ID (Dr. Linus Salmons 10/28 at 10:30am) and ortho (Dr. Lorin Mercy, 3 weeks after discharge, patient needs to schedule)  Patient to continue IV antibiotics via PICC x6 weeks. Ensure removal of PICC after completion. Started on gabapentin $RemoveBefor'100mg'DczauBjPavWt$  TID for neuropathic pain- assess long-term need, dose adjustments as needed Patient demonstrated anemia while hospitalized without the need for intervention. Hgb 8.1 on day of discharge. Consider repeat CBC and additional workup (ie peripheral smear, etc) if CBC abnormal.   Significant Procedures:  10/6: PICC insertion  Significant Labs and Imaging:  Recent Labs  Lab 01/24/21 0210 01/25/21 0311 01/28/21 0454  WBC 9.9 10.3 5.5  HGB 9.7* 9.8* 8.1*  HCT 30.1* 30.3* 25.3*  PLT 398 389 356   Recent Labs  Lab 01/23/21 2020 01/24/21 0210 01/25/21 0311 01/26/21 0729 01/27/21 0310 01/28/21 0454  NA 130* 129* 127* 129* 130* 129*  K 3.6 3.6 3.5 3.8 3.8 3.9  CL 97* 100 94* 96* 97* 96*  CO2 19* 21* $Remov'23 24 25 26  'quIJTL$ GLUCOSE 95 101* 108* 95 88 92  BUN 7* 8 6* 6* <5* <5*  CREATININE 0.93 0.82 0.79 0.82 0.83 0.90  CALCIUM 10.0 9.5 9.4 9.5 9.5 9.6  ALKPHOS 118 115  --   --   --   --   AST 14* 10*  --   --   --   --   ALT 6 6  --   --   --   --   ALBUMIN 3.4* 2.9*  --   --   --   --     DG Lumbar Spine Complete  Result Date: 11/26/2020 CLINICAL DATA:  Low back pain after fall.  Fall 3 weeks ago EXAM: LUMBAR SPINE - COMPLETE 4+ VIEW COMPARISON:  Reformats from abdominal CT 09/30/2020 FINDINGS: Broad-based dextroscoliotic curvature. Mild L1 superior endplate compression  fracture that appears acute/subacute. There is approximately 25% loss of height anterior centrally. No posterior cortex involvement or cortical buckling. No posterior element involvement. Remaining vertebral body heights are preserved. Endplate spurring throughout. Mild disc space narrowing at T12-L1 and L3-L4. Facet hypertrophy at L3-L4, L4-L5, and L5-S1. the sacroiliac joints are congruent. IMPRESSION: 1. Mild L1 superior endplate compression fracture that appears acute/subacute, and is new from 09/30/2020 abdominal CT. There is approximately 25% loss of height anterior centrally. 2. Multilevel degenerative change with broad-based dextroscoliotic curvature. Electronically Signed   By: Keith Rake M.D.   On: 11/26/2020 21:38   MR Lumbar Spine w/o contrast  Addendum Date: 01/23/2021   ADDENDUM REPORT: 01/23/2021 10:52 ADDENDUM: These results were called by telephone at the time of interpretation on 01/23/2021 at 10:47 am to provider Sudie Bailey PA , who verbally acknowledged these results. Electronically Signed   By: Franchot Gallo M.D.   On: 01/23/2021 10:52   Result Date: 01/23/2021 CLINICAL DATA:  Low back pain.  History of prostate cancer. EXAM: MRI LUMBAR SPINE WITHOUT CONTRAST TECHNIQUE: Multiplanar, multisequence MR imaging of the lumbar spine was performed. No intravenous contrast was administered. COMPARISON:  CT abdomen pelvis 09/30/2020. Lumbar radiographs 01/11/2021 FINDINGS: Segmentation:  Normal Alignment:  Normal Vertebrae: Bone marrow  edema throughout the T9 and T10 vertebral bodies. There is increased signal in the disc space. There is erosion of the endplates at T9 and P50 with significant loss of height of T9 and mild loss of height of T10. Findings compatible with disc space infection. Findings compatible with disc space infection also at T12-L1 with diffuse bone marrow edema and endplate erosion. There is moderate loss of height of L1 vertebral body. Mild retropulsion of L1 into  the canal. Conus medullaris and cauda equina: Conus extends to the L2 level. Conus and cauda equina appear normal. Paraspinal and other soft tissues: Negative for paraspinous mass or abscess. Axial images not obtained through the T9-10 level. Disc levels: T12-L1: Findings compatible with discitis and osteomyelitis. Retropulsion of L1 into the spinal canal causing moderate spinal stenosis. L1-2: Mild degenerative change.  Negative for stenosis L2-3: Mild disc and facet degeneration. Epidural lipomatosis. Mild spinal stenosis L3-4: Diffuse disc bulging and moderate facet hypertrophy. Prominent epidural lipomatosis. Moderate spinal stenosis L4-5: Disc bulging and moderate facet degeneration. Prominent epidural lipomatosis. Moderate spinal stenosis L5-S1: Bilateral facet degeneration. Prominent epidural lipomatosis. IMPRESSION: 1. Findings compatible with discitis and osteomyelitis at T9-10. Endplate erosion and fracture of T9 and T10. This was not present on the prior CT of 09/30/2020. Axial images not obtained through this level. Recommend further evaluation with MRI thoracic spine without and with contrast. 2. Findings compatible with discitis and osteomyelitis at T12-L1 with fracture of L1. Retropulsion of L1 into the canal contributing to moderate spinal stenosis. No abscess identified. Recommend postcontrast imaging lumbar spine at the time of the thoracic MRI. Findings not present on the prior CT abdomen pelvis 09/30/2020. 3. Prominent epidural lipomatosis in the lumbar spine with multilevel spinal stenosis. Electronically Signed: By: Franchot Gallo M.D. On: 01/23/2021 10:06   MR THORACIC SPINE W WO CONTRAST  Result Date: 01/24/2021 CLINICAL DATA:  Initial evaluation for epidural abscess. EXAM: MRI THORACIC WITHOUT AND WITH CONTRAST TECHNIQUE: Multiplanar and multiecho pulse sequences of the thoracic spine were obtained without and with intravenous contrast. CONTRAST:  11mL GADAVIST GADOBUTROL 1 MMOL/ML IV  SOLN COMPARISON:  MRI of the lumbar spine from 01/21/2021. FINDINGS: Alignment:  Examination moderately degraded by motion artifact. Mild exaggeration of the normal thoracic kyphosis. No significant listhesis. Vertebrae: Abnormal edema and enhancement seen involving the T9 and T10 vertebral bodies as well as the intervening T9-10 interspace. Associated disc space narrowing with endplate irregularity, erosion, and enhancement. Findings consistent with acute osteomyelitis discitis. Extension to involve the posterior elements as well, with possible septic arthritis involving the T9-10 facets bilaterally. Associated pathologic fracture of T9 with up to approximately 40% height loss and trace 3 mm bony retropulsion. Mild height loss noted at the adjacent superior endplate of D32 as well. Associated epidural enhancement/phlegmon within the adjacent ventral epidural space at the level of T9-10 (series 11, image 9). No frank epidural abscess. Additionally, phlegmonous changes present within the paraspinous soft tissues adjacent to the T9-10 interspace without definite discrete abscess or drainable fluid collection. Additional changes consistent with osteomyelitis discitis at T12-L1 again seen as well. Height loss at the L1 vertebral body suspected to reflect an associated pathologic fracture as well. Associated height loss measures up to approximately 50% with 5 mm bony retropulsion. Associated epidural enhancement/phlegmon at this level as well without frank epidural abscess. Mild paraspinous phlegmonous change without discrete soft tissue abscess or drainable fluid collection. No other sites of acute infection within the thoracic spine. Vertebral body height otherwise maintained. Bone marrow  signal intensity diffusely heterogeneous. No visible worrisome osseous lesions. Cord: Signal intensity within the thoracic spinal cord is grossly within normal limits on this motion degraded exam. Diffuse prominence of the dorsal  epidural fat throughout the thoracic spine noted. Paraspinal and other soft tissues: Paraspinous phlegmonous change adjacent to the T9-10 and T12-L1 interspaces as above. Mild diffuse subcutaneous edema seen throughout the visualized mid and lower back, which could be related to overall volume status. Approximate 1 cm T2 hyperintense simple cyst partially visualize within the left kidney. Multiple scattered probable cysts noted within the visualized liver, largest of which measures approximately 1 cm (series 6, image 23). Disc levels: T1-2: Degenerative intervertebral disc space narrowing with diffuse disc bulge and reactive endplate spurring. Bilateral facet hypertrophy. No significant stenosis. T2-3: Degenerative intervertebral disc space narrowing with diffuse disc bulge and reactive endplate spurring. Bilateral facet hypertrophy. No significant spinal stenosis. Foramina remain patent. T3-4: Degenerative intervertebral disc space narrowing with diffuse disc bulge and reactive endplate spurring. Bilateral facet hypertrophy with prominence of the dorsal epidural fat. Resultant mild spinal stenosis. Foramina remain patent. T4-5: Disc desiccation without significant disc bulge. Bilateral facet hypertrophy with prominence of the dorsal epidural fat. Moderate spinal stenosis, largely due to epidural fat. Foramina remain patent. T5-6: Negative interspace. Mild facet hypertrophy. Prominent dorsal epidural fat with associated moderate spinal stenosis. Foramina remain patent. T6-7: Negative interspace. Mild facet hypertrophy. Prominent dorsal epidural fat with associated moderate spinal stenosis. Foramina remain patent. T7-8: Negative interspace. Prominence of the dorsal epidural fat with associated moderate spinal stenosis. Foramina remain patent. T8-9: Disc desiccation with minimal disc bulge. Prominence of the dorsal epidural fat. Resultant moderate spinal stenosis. Foramina remain patent. T9-10: Changes consistent with  osteomyelitis discitis with associated T9 pathologic compression fracture and 3 mm bony retropulsion. Mild phlegmon within the ventral epidural space. Superimposed facet hypertrophy with prominence of the dorsal epidural fat. Resultant moderate spinal stenosis with minimal cord flattening, but no definite cord signal changes. Moderate bilateral foraminal narrowing. T10-11: Negative interspace. Bilateral facet hypertrophy. Prominence of the epidural fat. No significant spinal stenosis. Foramina remain patent. T11-12: Negative interspace. Bilateral facet hypertrophy with mild epidural lipomatosis. No significant spinal stenosis. Foramina remain patent. T12-L1: Changes consistent with osteomyelitis discitis. Pathologic L1 compression fracture with 5 mm bony retropulsion. Mild epidural phlegmon within the ventral epidural space. Bilateral facet hypertrophy with epidural lipomatosis. Resultant moderate spinal stenosis with mild cord flattening is relatively similar to previous. No definite cord signal changes. Mild bilateral foraminal narrowing. IMPRESSION: 1. Changes consistent with acute osteomyelitis discitis at T9-10 and T12-L1 as above. Associated epidural enhancement/phlegmon within the adjacent ventral epidural spaces at these levels without frank epidural abscess. Suspected involvement of the bilateral T9-10 facets with associated septic arthritis. 2. Associated pathologic compression fracture of T9 with 40% height loss and 3 mm bony retropulsion, with additional pathologic compression fracture of L1 with 50% height loss and 5 mm bony retropulsion. Associated moderate spinal stenosis at these levels, similar to previous. 3. Superimposed multilevel degenerative spondylosis, facet hypertrophy, and epidural lipomatosis. Resultant moderate diffuse spinal stenosis throughout the thoracic spine as above. Electronically Signed   By: Jeannine Boga M.D.   On: 01/24/2021 01:56   DG Abd 2 Views  Result Date:  01/24/2021 CLINICAL DATA:  Abdominal pain and constipation, initial encounter EXAM: ABDOMEN - 2 VIEW COMPARISON:  CT from 09/30/2020 FINDINGS: Cardiac shadow is within normal limits. Lungs are clear bilaterally. No acute bony abnormality in the chest is noted. Scattered large and small bowel  gas is noted. No free intraperitoneal air is seen. No abnormal mass or abnormal calcifications are noted. No acute bony abnormality is seen. Degenerative changes of lumbar spine are noted. IMPRESSION: No acute abnormality in the chest and abdomen. Electronically Signed   By: Inez Catalina M.D.   On: 01/24/2021 01:36   DG Hip Unilat W or Wo Pelvis 1 View Right  Result Date: 11/08/2020 CLINICAL DATA:  Fall.  Right hip pain. EXAM: DG HIP (WITH OR WITHOUT PELVIS) 1V RIGHT COMPARISON:  None. FINDINGS: There is no evidence of hip fracture or dislocation. There is no evidence of arthropathy or other focal bone abnormality. IMPRESSION: Negative right hip radiographs. Electronically Signed   By: San Morelle M.D.   On: 11/08/2020 12:17   XR Ankle Complete Left  Result Date: 12/07/2020 Three-view x-rays left ankle obtained and reviewed.  This shows slight osteopenia.  Negative for acute changes no degenerative changes are present in ankle joint. Impression: Normal left ankle radiographs with bone quality suggesting osteopenia.  XR Foot Complete Left  Result Date: 12/07/2020 Three-view x-rays left foot obtained and reviewed.  This is negative for acute fracture.  No subluxation no degenerative changes are present. Impression: Normal left foot radiographs without acute or chronic changes.  XR Lumbar Spine 2-3 Views  Result Date: 01/12/2021 2 view x-rays lumbar spine AP and lateral obtained and reviewed this shows L1 compression fracture unchanged from 11/26/2020 images.  No bone retropulsion. Impression: Stable L1 compression fracture.  [place] EKG SITE RITE  Result Date: 01/25/2021 If Site Rite image not attached,  placement could not be confirmed due to current cardiac rhythm.    Results/Tests Pending at Time of Discharge:  NA  Discharge Medications:  Allergies as of 01/28/2021   No Known Allergies      Medication List     STOP taking these medications    naproxen 500 MG tablet Commonly known as: Naprosyn       TAKE these medications    acetaminophen 500 MG tablet Commonly known as: TYLENOL Take 1,000 mg by mouth every 6 (six) hours as needed for moderate pain.   amLODipine 10 MG tablet Commonly known as: NORVASC TAKE 1 TABLET (10 MG TOTAL) BY MOUTH AT BEDTIME. What changed:  how much to take when to take this   cefTRIAXone  IVPB Commonly known as: ROCEPHIN Inject 2 g into the vein daily. Indication:  vertebral ostemomyelitis/discitis First Dose: No Last Day of Therapy:  03/12/21 Labs - Once weekly:  CBC/D and BMP, Labs - Every other week:  ESR and CRP Method of administration: IV Push Method of administration may be changed at the discretion of home infusion pharmacist based upon assessment of the patient and/or caregiver's ability to self-administer the medication ordered.   gabapentin 100 MG capsule Commonly known as: NEURONTIN Take 1 capsule (100 mg total) by mouth 3 (three) times daily.   losartan 100 MG tablet Commonly known as: COZAAR TAKE 1 TABLET (100 MG TOTAL) BY MOUTH AT BEDTIME. What changed:  how much to take when to take this   meclizine 25 MG tablet Commonly known as: ANTIVERT Take 1 tablet (25 mg total) by mouth daily as needed. What changed: reasons to take this   Oxycodone HCl 10 MG Tabs Take 1 tablet (10 mg total) by mouth every 8 (eight) hours as needed for up to 5 days for moderate pain.   polyethylene glycol powder 17 GM/SCOOP powder Commonly known as: GLYCOLAX/MIRALAX Take 17 g by mouth daily.  vancomycin  IVPB Inject 1,000 mg into the vein daily. Indication:  vertebral osteomyelitis/discitis First Dose: No Last Day of Therapy:   03/12/21 Labs - $Remo"Sunday/Monday:  CBC/D, BMP, and vancomycin trough. Labs - Thursday:  BMP and vancomycin trough Labs - Every other week:  ESR and CRP Method of administration:Elastomeric Method of administration may be changed at the discretion of the patient and/or caregiver's ability to self-administer the medication ordered.               Durable Medical Equipment  (From admission, onward)           Start     Ordered   01/26/21 1205  For home use only DME Walker rolling  Once       Question Answer Comment  Walker: With 5 Inch Wheels   Patient needs a walker to treat with the following condition Acute osteomyelitis of thoracic spine (HCC)      01/26/21 1205              Discharge Care Instructions  (From admission, onward)           Start     Ordered   01/28/21 0000  Change dressing on IV access line weekly and PRN  (Home infusion instructions - [facility] )        10"gDTDG$ /07/22 0936   01/28/21 0000  Discharge wound care:       Comments: Per RN   01/28/21 1430            Discharge Instructions: Please refer to Patient Instructions section of EMR for full details.  Patient was counseled important signs and symptoms that should prompt return to medical care, changes in medications, dietary instructions, activity restrictions, and follow up appointments.   Follow-Up Appointments: Future Appointments  Date Time Provider [place]  02/10/2021  3:30 PM Gifford Shave, MD [facility]  02/18/2021 10:30 AM Comer, Okey Regal, MD [facility]     Follow-up Information     Marybelle Killings, MD Follow up in 3 week(s).   Specialty: Orthopedic Surgery Contact information: [place] [address] [phone]         Thayer Headings, MD Follow up on 02/18/2021.   Specialty: Infectious Diseases Why: Please follow up for your appointment at 10:30 am. Contact information: [address]  [address] [phone]         Carollee Leitz, MD. Schedule an appointment as soon as possible for a visit.   Specialty: Family Medicine Contact information: [address] [place] [address] [phone]         [facility] Follow up.   Specialty: Home Health Services Why: The home health agency will contact you for the first home visit. Contact information: [address] [place] [address] [phone]                 Kelly C Greeson, Medical Student  FPTS Upper-Level Resident Attestation I was personally present and re-performed the exam and medical decision making and verified the service and findings are accurately documented in the student's note.  Alcus Dad, MD 01/28/2021 3:19 PM

## 2021-01-27 NOTE — Progress Notes (Signed)
FPTS Brief Progress Note  S: Patient states he is doing well. Denies presence of pain. Patient has no needs at this time and was appreciative of visit. States that he would prefer to go home instead of SNF.    O: BP (!) 127/91 (BP Location: Left Arm)   Pulse 85   Temp 98.9 F (37.2 C) (Oral)   Resp 16   Ht 5\' 8"  (1.727 m)   Wt 72.1 kg   SpO2 100%   BMI 24.18 kg/m   Gen: male appearing stated age lying in hospital bed in NAD, alert and oriented x4  A/P: Patient is 70 y.o. male admitted for osteomyelitis, currently doing well with stable vitals on intravenous abx, awaiting PICC placement with plan for continued outpatient abx.  - Orders reviewed. Labs for AM ordered, which was adjusted as needed.    Eulis Foster, MD 01/27/2021, 2:19 AM PGY-3, [facility] Night Resident  Please page [phone] with questions.

## 2021-01-27 NOTE — Care Management Important Message (Signed)
Important Message  Patient Details  Name: Jeffrey [name] MRN: [id number] Date of Birth: 1951-03-29   Medicare Important Message Given:  Yes     Orbie Pyo 01/27/2021, 3:02 PM

## 2021-01-27 NOTE — Plan of Care (Signed)
  Problem: Education: Goal: Knowledge of General Education information will improve Description: Including pain rating scale, medication(s)/side effects and non-pharmacologic comfort measures Outcome: Progressing   Problem: Health Behavior/Discharge Planning: Goal: Ability to manage health-related needs will improve Outcome: Progressing   Problem: Clinical Measurements: Goal: Ability to maintain clinical measurements within normal limits will improve Outcome: Progressing Goal: Diagnostic test results will improve Outcome: Progressing Goal: Respiratory complications will improve Outcome: Progressing Goal: Cardiovascular complication will be avoided Outcome: Progressing   Problem: Activity: Goal: Risk for activity intolerance will decrease Outcome: Progressing   Problem: Nutrition: Goal: Adequate nutrition will be maintained Outcome: Progressing   Problem: Coping: Goal: Level of anxiety will decrease Outcome: Progressing   Problem: Elimination: Goal: Will not experience complications related to bowel motility Outcome: Progressing Goal: Will not experience complications related to urinary retention Outcome: Progressing   Problem: Safety: Goal: Ability to remain free from injury will improve Outcome: Progressing   Problem: Skin Integrity: Goal: Risk for impaired skin integrity will decrease Outcome: Progressing

## 2021-01-28 ENCOUNTER — Other Ambulatory Visit (HOSPITAL_COMMUNITY): Payer: Self-pay

## 2021-01-28 DIAGNOSIS — M4624 Osteomyelitis of vertebra, thoracic region: Secondary | ICD-10-CM | POA: Diagnosis not present

## 2021-01-28 DIAGNOSIS — M4626 Osteomyelitis of vertebra, lumbar region: Secondary | ICD-10-CM | POA: Diagnosis not present

## 2021-01-28 LAB — BASIC METABOLIC PANEL
Anion gap: 7 (ref 5–15)
BUN: <5 mg/dL — ABNORMAL LOW (ref 8–23)
CO2: 26 mmol/L (ref 22–32)
Calcium: 9.6 mg/dL (ref 8.9–10.3)
Chloride: 96 mmol/L — ABNORMAL LOW (ref 98–111)
Creatinine, Ser: 0.9 mg/dL (ref 0.61–1.24)
GFR, Estimated: >60 mL/min (ref >60)
Glucose, Bld: 92 mg/dL (ref 70–99)
Potassium: 3.9 mmol/L (ref 3.5–5.1)
Sodium: 129 mmol/L — ABNORMAL LOW (ref 135–145)

## 2021-01-28 LAB — CBC WITH DIFFERENTIAL/PLATELET
Abs Immature Granulocytes: 0.02 10*3/uL (ref 0.00–0.07)
Basophils Absolute: 0 10*3/uL (ref 0.0–0.1)
Basophils Relative: 0 %
Eosinophils Absolute: 0.2 10*3/uL (ref 0.0–0.5)
Eosinophils Relative: 3 %
HCT: 25.3 % — ABNORMAL LOW (ref 39.0–52.0)
Hemoglobin: 8.1 g/dL — ABNORMAL LOW (ref 13.0–17.0)
Immature Granulocytes: 0 %
Lymphocytes Relative: 18 %
Lymphs Abs: 1 10*3/uL (ref 0.7–4.0)
MCH: 29.9 pg (ref 26.0–34.0)
MCHC: 32 g/dL (ref 30.0–36.0)
MCV: 93.4 fL (ref 80.0–100.0)
Monocytes Absolute: 0.7 10*3/uL (ref 0.1–1.0)
Monocytes Relative: 12 %
Neutro Abs: 3.7 10*3/uL (ref 1.7–7.7)
Neutrophils Relative %: 67 %
Platelets: 356 10*3/uL (ref 150–400)
RBC: 2.71 MIL/uL — ABNORMAL LOW (ref 4.22–5.81)
RDW: 14.8 % (ref 11.5–15.5)
WBC: 5.5 10*3/uL (ref 4.0–10.5)
nRBC: 0 % (ref 0.0–0.2)

## 2021-01-28 LAB — CULTURE, BLOOD (ROUTINE X 2): Culture: NO GROWTH

## 2021-01-28 MED ORDER — POLYETHYLENE GLYCOL 3350 17 GM/SCOOP PO POWD
17.0000 g | Freq: Every day | ORAL | 0 refills | Status: AC
  Filled 2021-01-28: qty 238, 14d supply, fill #0

## 2021-01-28 MED ORDER — CEFTRIAXONE IV (FOR PTA / DISCHARGE USE ONLY): 2.0000 g | INTRAVENOUS | 0 refills | Status: DC

## 2021-01-28 MED ORDER — VANCOMYCIN IV (FOR PTA / DISCHARGE USE ONLY): 1000.0000 mg | INTRAVENOUS | 0 refills | Status: DC

## 2021-01-28 MED ORDER — GABAPENTIN 100 MG PO CAPS
100.0000 mg | ORAL_CAPSULE | Freq: Three times a day (TID) | ORAL | 0 refills | Status: DC
  Filled 2021-01-28: qty 90, 30d supply, fill #0

## 2021-01-28 MED ORDER — OXYCODONE HCL 10 MG PO TABS
10.0000 mg | ORAL_TABLET | Freq: Three times a day (TID) | ORAL | 0 refills | Status: DC | PRN
  Filled 2021-01-28: qty 15, 5d supply, fill #0

## 2021-01-28 NOTE — TOC Transition Note (Signed)
Transition of Care [facility]) - CM/SW Discharge Note   Patient Details  Name: Jeffrey [name] MRN: [id number] Date of Birth: [birth year]  [facility]) CM/SW Contact:  Pollie Friar, RN Phone Number: 01/28/2021, 1:14 PM   Clinical Narrative:    Patient is discharging home with [facility] services through [place]. Cory with [facility] is aware of d/c home today.  IV abx for home will be supplied through [facility]. Pam with [facility] is going to provide education to patient and daughter at the bedside today. Walker for home to be delivered to the room per [employer].  Pt has transport home today.    Final next level of care: Home w Home Health Services Barriers to Discharge: No Barriers Identified   Patient Goals and CMS Choice   CMS Medicare.gov Compare Post Acute Care list provided to:: Patient Choice offered to / list presented to : Patient  Discharge Placement                       Discharge Plan and Services   Discharge Planning Services: CM Consult Post Acute Care Choice: IP Rehab          DME Arranged: Walker rolling DME Agency: [employer] Date DME Agency Contacted: 01/28/21   Representative spoke with at DME Agency: Freda Munro HH Arranged: RN, PT, OT [facility] Agency: [place] Date [place]: 01/28/21   Representative spoke with at [place]: [place] ([place]) Interventions     Readmission Risk Interventions No flowsheet data found.

## 2021-01-28 NOTE — Progress Notes (Signed)
FPTS Interim Progress Note  S:Patient seen at bedside for nighttime rounds. He was conversational and awake. Patient does not have any pain.   O: BP (!) 148/85 (BP Location: Left Arm)   Pulse 89   Temp 97.7 F (36.5 C) (Oral)   Resp 18   Ht 5\' 8"  (1.727 m)   Wt 72.1 kg   SpO2 100%   BMI 24.18 kg/m   General: In no distress, laying in bed comfortably CV: Regular rate Respiratory: Normal chest rise and fall  A/P: Continue management per day team. Vital signs reviewed- stable.   Orvis Brill, DO 01/28/2021, 3:44 AM PGY-1, [place] Medicine Service pager [phone]

## 2021-01-28 NOTE — Progress Notes (Signed)
Jeffrey [name] to be D/C'd home with home health. Discussed with the patient and all questions fully answered.  Skin clean and dry without evidence of skin break down, no evidence of skin tears noted.  PICC line clean and intact. An After Visit Summary was printed and given to the patient. Meds delivered from [facility]. Patient escorted via [place], and D/C home via private auto.  [name]  01/28/2021 5:13 PM

## 2021-01-28 NOTE — Care Management Important Message (Signed)
Important Message  Patient Details  Name: Jeffrey [name] MRN: [id number] Date of Birth: 1951-04-10   Medicare Important Message Given:  Yes  Patient has a contact precaution in place will mail this doc to the patient home address.    Pressley Tadesse 01/28/2021, 9:42 AM

## 2021-01-28 NOTE — Progress Notes (Signed)
Physical Therapy Treatment Patient Details Name: Jeffrey [name] MRN: [id number] DOB: [birth year] Today's Date: 01/28/2021   History of Present Illness Jeffrey [name] is a 70 y.o. male  who presented to [facility] hospital on 01/23/2021 after the results of his MRI of his lumbar spine returned, which revealed evidence of discitis and osteomyelitis at T9-T10, and T12-L1 with a compression fracture of L1. The compression fx was known, due to a fall in July. PMHx: HTN, HLD, prostate cancer s/p prostatectomy on 10/15/2020, alcohol use disorder, and chronic hepatitis C.    PT Comments    Pt tolerates treatment well with improved ambulation distance and performing multiple transfers. Pt continues to report RLE radiating pain, PT provides education on pelvic tilt exercises in an effort to improve comfort as pt reports pain with sitting that is eventually relieved by standing or laying. Pt will benefit from continued acute PT services to improve activity tolerance and restore independence.   Recommendations for follow up therapy are one component of a multi-disciplinary discharge planning process, led by the attending physician.  Recommendations may be updated based on patient status, additional functional criteria and insurance authorization.  Follow Up Recommendations  Home health PT     Equipment Recommendations  Rolling walker with 5" wheels    Recommendations for Other Services       Precautions / Restrictions Precautions Precautions: Fall;Back Precaution Booklet Issued: No Precaution Comments: PT reviews back precautions, provides cueing for log roll Required Braces or Orthoses: Spinal Brace Spinal Brace: Lumbar corset;Applied in sitting position Restrictions Weight Bearing Restrictions: No     Mobility  Bed Mobility Overal bed mobility: Needs Assistance Bed Mobility: Rolling;Sidelying to Sit;Sit to Supine Rolling: Supervision Sidelying to sit: Supervision   Sit to supine: Min guard         Transfers Overall transfer level: Needs assistance Equipment used: Rolling walker (2 wheeled) Transfers: Sit to/from Stand Sit to Stand: Supervision            Ambulation/Gait Ambulation/Gait assistance: Supervision Gait Distance (Feet): 400 Feet Assistive device: Rolling walker (2 wheeled) Gait Pattern/deviations: Step-through pattern Gait velocity: functional Gait velocity interpretation: 1.31 - 2.62 ft/sec, indicative of limited community ambulator General Gait Details: pt with slowed step-through gait   Stairs             Wheelchair Mobility    Modified Rankin (Stroke Patients Only)       Balance Overall balance assessment: Needs assistance Sitting-balance support: No upper extremity supported;Feet supported Sitting balance-Leahy Scale: Good     Standing balance support: Single extremity supported;Bilateral upper extremity supported Standing balance-Leahy Scale: Poor Standing balance comment: reliant on UE support of walker                            Cognition Arousal/Alertness: Awake/alert Behavior During Therapy: WFL for tasks assessed/performed;Agitated;Anxious Overall Cognitive Status: Within Functional Limits for tasks assessed                                        Exercises      General Comments General comments (skin integrity, edema, etc.): VSS on RA      Pertinent Vitals/Pain Pain Assessment: Faces Faces Pain Scale: Hurts whole lot Pain Location: low back radiating to RLE Pain Descriptors / Indicators: Moaning Pain Intervention(s): Monitored during session    Home Living  Prior Function            PT Goals (current goals can now be found in the care plan section) Acute Rehab PT Goals Patient Stated Goal: to reduce pain Progress towards PT goals: Progressing toward goals    Frequency    Min 3X/week      PT Plan Current plan remains appropriate     Co-evaluation              AM-PAC PT "6 Clicks" Mobility   Outcome Measure  Help needed turning from your back to your side while in a flat bed without using bedrails?: A Little Help needed moving from lying on your back to sitting on the side of a flat bed without using bedrails?: A Little Help needed moving to and from a bed to a chair (including a wheelchair)?: A Little Help needed standing up from a chair using your arms (e.g., wheelchair or bedside chair)?: A Little Help needed to walk in hospital room?: A Little Help needed climbing 3-5 steps with a railing? : A Little 6 Click Score: 18    End of Session Equipment Utilized During Treatment: Back brace Activity Tolerance: Patient tolerated treatment well Patient left: in bed;with call bell/phone within reach;with bed alarm set Nurse Communication: Mobility status PT Visit Diagnosis: Unsteadiness on feet (R26.81);Other abnormalities of gait and mobility (R26.89);Pain;Muscle weakness (generalized) (M62.81) Pain - part of body: Leg     Time: 4332-9518 PT Time Calculation (min) (ACUTE ONLY): 37 min  Charges:  $Gait Training: 8-22 mins $Therapeutic Activity: 23-37 mins                     Zenaida Niece, PT, DPT Acute Rehabilitation Pager: [phone]    Zenaida Niece 01/28/2021, 12:58 PM

## 2021-01-28 NOTE — Plan of Care (Signed)
Pt educated on the importance of keeping the PICC clean, dry, and intact. Pt understood. Daughter was educated by CM about the antibiotics. No questions or concerns stated.  Problem: Education: Goal: Knowledge of General Education information will improve Description: Including pain rating scale, medication(s)/side effects and non-pharmacologic comfort measures Outcome: Progressing   Problem: Health Behavior/Discharge Planning: Goal: Ability to manage health-related needs will improve Outcome: Progressing   Problem: Clinical Measurements: Goal: Ability to maintain clinical measurements within normal limits will improve Outcome: Progressing Goal: Will remain free from infection Outcome: Progressing Goal: Diagnostic test results will improve Outcome: Progressing Goal: Respiratory complications will improve Outcome: Progressing Goal: Cardiovascular complication will be avoided Outcome: Progressing   Problem: Activity: Goal: Risk for activity intolerance will decrease Outcome: Progressing   Problem: Nutrition: Goal: Adequate nutrition will be maintained Outcome: Progressing   Problem: Coping: Goal: Level of anxiety will decrease Outcome: Progressing   Problem: Elimination: Goal: Will not experience complications related to bowel motility Outcome: Progressing Goal: Will not experience complications related to urinary retention Outcome: Progressing   Problem: Pain Managment: Goal: General experience of comfort will improve Outcome: Progressing   Problem: Safety: Goal: Ability to remain free from injury will improve Outcome: Progressing   Problem: Skin Integrity: Goal: Risk for impaired skin integrity will decrease Outcome: Progressing

## 2021-01-31 ENCOUNTER — Encounter (HOSPITAL_COMMUNITY): Payer: Self-pay

## 2021-01-31 ENCOUNTER — Inpatient Hospital Stay (HOSPITAL_COMMUNITY)
Admission: EM | Admit: 2021-01-31 | Discharge: 2021-02-04 | DRG: 540 | Disposition: A | Discharge status: Skilled Nursing Facility | Payer: Medicare Other | Attending: Family Medicine | Admitting: Family Medicine

## 2021-01-31 DIAGNOSIS — E440 Moderate protein-calorie malnutrition: Secondary | ICD-10-CM | POA: Insufficient documentation

## 2021-01-31 DIAGNOSIS — Z9049 Acquired absence of other specified parts of digestive tract: Secondary | ICD-10-CM

## 2021-01-31 DIAGNOSIS — M869 Osteomyelitis, unspecified: Secondary | ICD-10-CM

## 2021-01-31 DIAGNOSIS — Z20822 Contact with and (suspected) exposure to covid-19: Secondary | ICD-10-CM | POA: Diagnosis present

## 2021-01-31 DIAGNOSIS — Z8546 Personal history of malignant neoplasm of prostate: Secondary | ICD-10-CM

## 2021-01-31 DIAGNOSIS — M4800 Spinal stenosis, site unspecified: Secondary | ICD-10-CM | POA: Diagnosis present

## 2021-01-31 DIAGNOSIS — M4624 Osteomyelitis of vertebra, thoracic region: Secondary | ICD-10-CM | POA: Diagnosis not present

## 2021-01-31 DIAGNOSIS — Z96652 Presence of left artificial knee joint: Secondary | ICD-10-CM | POA: Diagnosis present

## 2021-01-31 DIAGNOSIS — M5442 Lumbago with sciatica, left side: Secondary | ICD-10-CM | POA: Diagnosis present

## 2021-01-31 DIAGNOSIS — K5909 Other constipation: Secondary | ICD-10-CM

## 2021-01-31 DIAGNOSIS — Z6820 Body mass index (BMI) 20.0-20.9, adult: Secondary | ICD-10-CM

## 2021-01-31 DIAGNOSIS — F1010 Alcohol abuse, uncomplicated: Secondary | ICD-10-CM | POA: Diagnosis present

## 2021-01-31 DIAGNOSIS — M5441 Lumbago with sciatica, right side: Secondary | ICD-10-CM | POA: Diagnosis present

## 2021-01-31 DIAGNOSIS — K5900 Constipation, unspecified: Secondary | ICD-10-CM | POA: Diagnosis present

## 2021-01-31 DIAGNOSIS — M4626 Osteomyelitis of vertebra, lumbar region: Secondary | ICD-10-CM | POA: Diagnosis present

## 2021-01-31 DIAGNOSIS — E785 Hyperlipidemia, unspecified: Secondary | ICD-10-CM | POA: Diagnosis present

## 2021-01-31 DIAGNOSIS — R52 Pain, unspecified: Secondary | ICD-10-CM

## 2021-01-31 DIAGNOSIS — F17210 Nicotine dependence, cigarettes, uncomplicated: Secondary | ICD-10-CM | POA: Diagnosis present

## 2021-01-31 DIAGNOSIS — K7400 Hepatic fibrosis, unspecified: Secondary | ICD-10-CM | POA: Diagnosis present

## 2021-01-31 DIAGNOSIS — Z7251 High risk heterosexual behavior: Secondary | ICD-10-CM

## 2021-01-31 DIAGNOSIS — I10 Essential (primary) hypertension: Secondary | ICD-10-CM | POA: Diagnosis present

## 2021-01-31 DIAGNOSIS — Z9079 Acquired absence of other genital organ(s): Secondary | ICD-10-CM

## 2021-01-31 DIAGNOSIS — R32 Unspecified urinary incontinence: Secondary | ICD-10-CM | POA: Diagnosis present

## 2021-01-31 DIAGNOSIS — E569 Vitamin deficiency, unspecified: Secondary | ICD-10-CM | POA: Diagnosis present

## 2021-01-31 DIAGNOSIS — E871 Hypo-osmolality and hyponatremia: Secondary | ICD-10-CM | POA: Diagnosis present

## 2021-01-31 DIAGNOSIS — Z79899 Other long term (current) drug therapy: Secondary | ICD-10-CM

## 2021-01-31 DIAGNOSIS — B182 Chronic viral hepatitis C: Secondary | ICD-10-CM | POA: Diagnosis present

## 2021-01-31 DIAGNOSIS — K219 Gastro-esophageal reflux disease without esophagitis: Secondary | ICD-10-CM | POA: Diagnosis present

## 2021-01-31 LAB — CBC WITH DIFFERENTIAL/PLATELET
Abs Immature Granulocytes: 0.03 10*3/uL (ref 0.00–0.07)
Basophils Absolute: 0 10*3/uL (ref 0.0–0.1)
Basophils Relative: 0 %
Eosinophils Absolute: 0.2 10*3/uL (ref 0.0–0.5)
Eosinophils Relative: 2 %
HCT: 31.2 % — ABNORMAL LOW (ref 39.0–52.0)
Hemoglobin: 9.5 g/dL — ABNORMAL LOW (ref 13.0–17.0)
Immature Granulocytes: 0 %
Lymphocytes Relative: 5 %
Lymphs Abs: 0.6 10*3/uL — ABNORMAL LOW (ref 0.7–4.0)
MCH: 29.8 pg (ref 26.0–34.0)
MCHC: 30.4 g/dL (ref 30.0–36.0)
MCV: 97.8 fL (ref 80.0–100.0)
Monocytes Absolute: 1.1 10*3/uL — ABNORMAL HIGH (ref 0.1–1.0)
Monocytes Relative: 9 %
Neutro Abs: 10.7 10*3/uL — ABNORMAL HIGH (ref 1.7–7.7)
Neutrophils Relative %: 84 %
Platelets: 353 10*3/uL (ref 150–400)
RBC: 3.19 MIL/uL — ABNORMAL LOW (ref 4.22–5.81)
RDW: 14.7 % (ref 11.5–15.5)
WBC: 12.8 10*3/uL — ABNORMAL HIGH (ref 4.0–10.5)
nRBC: 0 % (ref 0.0–0.2)

## 2021-01-31 LAB — COMPREHENSIVE METABOLIC PANEL
ALT: 8 U/L (ref 0–44)
AST: 21 U/L (ref 15–41)
Albumin: 3.1 g/dL — ABNORMAL LOW (ref 3.5–5.0)
Alkaline Phosphatase: 90 U/L (ref 38–126)
Anion gap: 11 (ref 5–15)
BUN: 7 mg/dL — ABNORMAL LOW (ref 8–23)
CO2: 22 mmol/L (ref 22–32)
Calcium: 9.9 mg/dL (ref 8.9–10.3)
Chloride: 98 mmol/L (ref 98–111)
Creatinine, Ser: 0.96 mg/dL (ref 0.61–1.24)
GFR, Estimated: >60 mL/min (ref >60)
Glucose, Bld: 100 mg/dL — ABNORMAL HIGH (ref 70–99)
Potassium: 4.4 mmol/L (ref 3.5–5.1)
Sodium: 131 mmol/L — ABNORMAL LOW (ref 135–145)
Total Bilirubin: 1 mg/dL (ref 0.3–1.2)
Total Protein: 8 g/dL (ref 6.5–8.1)

## 2021-01-31 LAB — CK: Total CK: 37 U/L — ABNORMAL LOW (ref 49–397)

## 2021-01-31 NOTE — ED Triage Notes (Signed)
Patient was recently admitted x 2 weeks for spinal infection. Yesterday had increase in activity, walking, bending, etc and now having increase in back pain today. Last took oxycodone at 12:00

## 2021-01-31 NOTE — ED Provider Notes (Signed)
Emergency Medicine Provider Triage Evaluation Note  Jeffrey [name] , a 70 y.o. male  was evaluated in triage.  Pt complains of right hamstring pain/spasm.  Notably patient is currently undergoing treatment for osteomyelitis of spine with antibiotics.  He states from the same back pain standpoint he is doing well.  Review of Systems  Positive: Right hamstring pain Negative: Fever  Physical Exam  BP (!) 170/85 (BP Location: Left Arm)   Pulse (!) 106   Temp 99.4 F (37.4 C)   Resp 18   SpO2 99%  Gen:   Awake, no distress   Resp:  Normal effort  MSK:   Moves extremities without difficulty  Other:  Patient currently wearing back brace.  No tenderness to palpation of right hamstring.  Medical Decision Making  Medically screening exam initiated at 3:52 PM.  Appropriate orders placed.  Jeffrey [name] was informed that the remainder of the evaluation will be completed by another provider, this initial triage assessment does not replace that evaluation, and the importance of remaining in the ED until their evaluation is complete.  Will obtain basic labs, CK   Tedd Sias, [place] 01/31/21 [place], [place], DO 02/01/21 [id number]

## 2021-02-01 ENCOUNTER — Emergency Department (HOSPITAL_COMMUNITY): Payer: Medicare Other

## 2021-02-01 DIAGNOSIS — R531 Weakness: Secondary | ICD-10-CM

## 2021-02-01 DIAGNOSIS — M5442 Lumbago with sciatica, left side: Secondary | ICD-10-CM

## 2021-02-01 DIAGNOSIS — M5441 Lumbago with sciatica, right side: Secondary | ICD-10-CM

## 2021-02-01 DIAGNOSIS — M4624 Osteomyelitis of vertebra, thoracic region: Principal | ICD-10-CM

## 2021-02-01 LAB — SEDIMENTATION RATE: Sed Rate: 115 mm/hr — ABNORMAL HIGH (ref 0–16)

## 2021-02-01 LAB — VANCOMYCIN, RANDOM: Vancomycin Rm: 6

## 2021-02-01 LAB — C-REACTIVE PROTEIN: CRP: 6.4 mg/dL — ABNORMAL HIGH (ref <1.0)

## 2021-02-01 MED ORDER — HYDROMORPHONE HCL 1 MG/ML IJ SOLN
1.0000 mg | Freq: Once | INTRAMUSCULAR | Status: AC
  Administered 2021-02-01: 1 mg via INTRAVENOUS
  Filled 2021-02-01: qty 1

## 2021-02-01 MED ORDER — FENTANYL CITRATE PF 50 MCG/ML IJ SOSY
100.0000 ug | PREFILLED_SYRINGE | Freq: Once | INTRAMUSCULAR | Status: AC
  Administered 2021-02-01: 100 ug via INTRAVENOUS
  Filled 2021-02-01: qty 2

## 2021-02-01 MED ORDER — MELATONIN 3 MG PO TABS
3.0000 mg | ORAL_TABLET | Freq: Every day | ORAL | Status: DC
  Administered 2021-02-01 – 2021-02-03 (×3): 3 mg via ORAL
  Filled 2021-02-01 (×3): qty 1

## 2021-02-01 MED ORDER — VANCOMYCIN HCL IN DEXTROSE 1-5 GM/200ML-% IV SOLN
1000.0000 mg | INTRAVENOUS | Status: DC
  Administered 2021-02-01: 1000 mg via INTRAVENOUS
  Filled 2021-02-01: qty 200

## 2021-02-01 MED ORDER — POLYETHYLENE GLYCOL 3350 17 G PO PACK
17.0000 g | PACK | Freq: Every day | ORAL | Status: DC
  Administered 2021-02-01: 17 g via ORAL
  Filled 2021-02-01: qty 1

## 2021-02-01 MED ORDER — VANCOMYCIN HCL 750 MG/150ML IV SOLN
750.0000 mg | Freq: Once | INTRAVENOUS | Status: DC
  Filled 2021-02-01: qty 150

## 2021-02-01 MED ORDER — GABAPENTIN 100 MG PO CAPS
200.0000 mg | ORAL_CAPSULE | Freq: Three times a day (TID) | ORAL | Status: DC
  Administered 2021-02-01 (×3): 200 mg via ORAL
  Filled 2021-02-01 (×3): qty 2

## 2021-02-01 MED ORDER — OXYCODONE HCL 5 MG PO TABS
5.0000 mg | ORAL_TABLET | ORAL | Status: DC | PRN
  Administered 2021-02-01 – 2021-02-04 (×11): 5 mg via ORAL
  Filled 2021-02-01 (×12): qty 1

## 2021-02-01 MED ORDER — VANCOMYCIN HCL 1750 MG/350ML IV SOLN
1750.0000 mg | Freq: Every day | INTRAVENOUS | Status: DC
  Administered 2021-02-02 (×2): 1750 mg via INTRAVENOUS
  Filled 2021-02-01 (×3): qty 350

## 2021-02-01 MED ORDER — METHOCARBAMOL 500 MG PO TABS
500.0000 mg | ORAL_TABLET | Freq: Once | ORAL | Status: AC
  Administered 2021-02-01: 500 mg via ORAL
  Filled 2021-02-01: qty 1

## 2021-02-01 MED ORDER — ENOXAPARIN SODIUM 40 MG/0.4ML IJ SOSY
40.0000 mg | PREFILLED_SYRINGE | Freq: Every day | INTRAMUSCULAR | Status: DC
  Administered 2021-02-01 – 2021-02-04 (×4): 40 mg via SUBCUTANEOUS
  Filled 2021-02-01 (×4): qty 0.4

## 2021-02-01 MED ORDER — AMLODIPINE BESYLATE 10 MG PO TABS
10.0000 mg | ORAL_TABLET | Freq: Every day | ORAL | Status: DC
  Administered 2021-02-01 – 2021-02-04 (×4): 10 mg via ORAL
  Filled 2021-02-01: qty 1
  Filled 2021-02-01: qty 2
  Filled 2021-02-01 (×2): qty 1

## 2021-02-01 MED ORDER — ACETAMINOPHEN 325 MG PO TABS
650.0000 mg | ORAL_TABLET | Freq: Four times a day (QID) | ORAL | Status: DC | PRN
  Administered 2021-02-02 – 2021-02-04 (×4): 650 mg via ORAL
  Filled 2021-02-01 (×4): qty 2

## 2021-02-01 MED ORDER — SENNOSIDES-DOCUSATE SODIUM 8.6-50 MG PO TABS
1.0000 | ORAL_TABLET | Freq: Every day | ORAL | Status: DC
  Administered 2021-02-01: 1 via ORAL
  Filled 2021-02-01: qty 1

## 2021-02-01 MED ORDER — LOSARTAN POTASSIUM 50 MG PO TABS
100.0000 mg | ORAL_TABLET | Freq: Every day | ORAL | Status: DC
  Administered 2021-02-01 – 2021-02-04 (×4): 100 mg via ORAL
  Filled 2021-02-01 (×4): qty 2

## 2021-02-01 MED ORDER — SODIUM CHLORIDE 0.9 % IV SOLN
2.0000 g | Freq: Every day | INTRAVENOUS | Status: DC
  Administered 2021-02-01 – 2021-02-04 (×4): 2 g via INTRAVENOUS
  Filled 2021-02-01 (×4): qty 20

## 2021-02-01 MED ORDER — THIAMINE HCL 100 MG PO TABS
100.0000 mg | ORAL_TABLET | Freq: Every day | ORAL | Status: DC
  Administered 2021-02-01 – 2021-02-04 (×4): 100 mg via ORAL
  Filled 2021-02-01 (×4): qty 1

## 2021-02-01 MED ORDER — GADOBUTROL 1 MMOL/ML IV SOLN
7.0000 mL | Freq: Once | INTRAVENOUS | Status: AC | PRN
  Administered 2021-02-01: 7 mL via INTRAVENOUS

## 2021-02-01 MED ORDER — ACETAMINOPHEN 650 MG RE SUPP: 650.0000 mg | Freq: Four times a day (QID) | RECTAL | Status: DC | PRN

## 2021-02-01 NOTE — H&P (Addendum)
[facility] Admission History and Physical Service Pager: [phone]  Patient name: Jeffrey [name] Medical record number: [id number] Date of birth: June 17, 1950 Age: 70 y.o. Gender: male  Primary Care Provider: Carollee Leitz, MD Consultants: Ortho Code Status: Full  Preferred Emergency Contact: Otilio Miu, [phone], daughter    Judy Pimple, [phone], Friend  Chief Complaint: Lower back and leg pain  Assessment and Plan: Jeffrey [name] is a 70 y.o. male presenting with severe back and leg pain with known diagnosis of vertebral osteomyelitis and discitis. PMH is significant for hypertension, prostate cancer s/p radical prostatectomy, chronic alcohol use disorder, cataracts, dyslipidemia, history of hep C and hepatic cysts.  Osteomyelitis and discitis of T9-T10, T12-L1  L1 compression fracture Patient was recently hospitalized from 01/23/2021-01/28/2021 for osteomyelitis and discitis.  He has had L1 compression fracture found in July 2022.  He had a PICC line placed and was discharged on a long-term course of vancomycin 750 mg IV twice daily plus ceftriaxone 2 g IV twice daily for 6.  With weekly labs including CBC with differential, CMP, CRP, ESR, vancomycin trough.  Per patient, daughter has been administering IV antibiotics appropriately since discharge.  He was discharged with oxycodone 10 mg every 8 hours as needed for pain control.  Patient states he is having a difficult time with ADLs at home and states he realizes now he could benefit from time at a rehabilitation facility.  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  MRI also shows unchanged L1 compression fracture.  In the ED, patient has remained afebrile, pulse rate between 89-112, normal respiratory rate, BPs ranging from 124/92-170/85.  In the ED, he received  Robaxin 500 mg, fentanyl 100 mcg IV and Dilaudid 1 mg IV. He rates his lower back pain at 7/10 and is also complaining of pain that starts in his buttocks and shoots down to mid posterior thigh when lifting his legs.This occurs bilaterally and is likely due to spinal compression related to osteomyelitis/discitis.  WBC slightly increased at 12.8, this is increased from 5.5 since he was discharged.  Sed rate is elevated at 115 which is increased from 96 since discharge.  Hemoglobin is stable at 9.5, this is increased from 8.1 since discharge.  CK is low at 37.  -Consult to [facility] -consult ortho -Consult to pharmacy for vancomycin -Ceftriaxone 2 g IV daily -Tylenol 650 mg every 6 hours. -Oxycodone 5 mg every 4 hours as needed -AM CBC -AM CMP -Vancomycin blood level -OT/PT eval and treat  Hypertension BP this morning is 137/95.  Home medication includes amlodipine, losartan -Amlodipine 10 mg daily -Losartan 100 mg daily  Chronic hyponatremia Chronic hyponatremia is likely secondary to alcohol use disorder.  Sodium is 131 on admission this is up from 129 at discharge. -CTM  Constipation Patient states last bowel movement was on 01/30/2021. -MiraLAX 17 g daily twice daily -Senna 1 tablet daily  Alcohol use disorder Patient typically drinks 3 to 4 drinks daily with no history of DT or withdrawal seizures. -CIWA protocol -Thiamine 100 mg daily   Radiculopathy in bilateral lower extremities Nephropathy likely due to vitamin deficiency in setting of alcohol use disorder or secondary to spinal stenosis in context of osteomyelitis. -Gabapentin 100 mg 3 times daily  Chronic hepatitis C, genotype 1  Positive in the early 2000's and again in 2017.  Patient completed 8 weeks of [name] treatment and had work-up to  include liver staging elastography which showed no cirrhosis and F2 to F3 fibrosis -Avoid hepatotoxic medications  History of benign neoplasm of colon s/p right colectomy Colectomy in  2012.  Last colonoscopy in 2018 with recommendation colonoscopies every 2 years -Recommend maintaining outpatient colonoscopy schedule  Circadian rhythm regulation -Continue melatonin 3 mg nightly  FEN/GI: Cardiac diet Prophylaxis: Lovenox  Disposition: med surg  History of Present Illness:  Jeffrey [name] is a 70 y.o. male presenting with lower back and leg pain.  Patient was discharged home on 01/28/2021 with a diagnosis of osteomyelitis and discitis with a PICC line placed and on IV antibiotics.  The pain has made it difficult for him to move around and he has had a hard time with ADLs including feeding himself.  His daughter has been coming to help when she can and to administer his IV antibiotics.  Today he rates his back pain at a 7/10 and states when he lifts his legs he feels a sharp shooting pain from his buttock to his mid posterior thigh.  This occurs bilaterally.  He states he has been taking his oxycodone he was discharged on which helps a little.  He denies fever, chills, headache and denies having any falls at home.  He states he has been constipated with his last bowel movement being on Sunday, 01/30/2021.  He states he is now interested in being discharged to a rehabilitation facility as he does not feel he can care for himself at home in his current condition.  Review Of Systems: Per HPI with the following additions:   Review of Systems  Constitutional:  Negative for appetite change, chills and fever.  Respiratory:  Negative for shortness of breath.   Cardiovascular:  Negative for chest pain.  Gastrointestinal:  Positive for constipation. Negative for diarrhea.  Genitourinary:  Negative for difficulty urinating and dysuria.  Musculoskeletal:  Positive for back pain. Negative for neck stiffness.  Neurological:  Negative for headaches.    Patient Active Problem List   Diagnosis Date Noted   Constipation    Sepsis ([place]) 01/24/2021   Neuropathic pain of both legs 01/24/2021    Osteomyelitis of lumbar spine ([place]) 01/24/2021   Osteomyelitis of thoracic spine ([place]) 01/23/2021   Foot swelling 12/07/2020   Compression fracture of L1 lumbar vertebra ([facility]) 12/05/2020   Cellulitis of left lower extremity 12/05/2020   Bilateral low back pain without sciatica 11/26/2020   Prostate cancer ([place]) 10/15/2020   Nocturia 05/14/2020   High risk heterosexual behavior 06/15/2019   Tobacco use 10/12/2018   Acid reflux 07/25/2018   Rash and nonspecific skin eruption 03/06/2018   Liver fibrosis 02/13/2018   Tubular adenoma 12/26/2016   Left hip pain 11/26/2016   History of partial colectomy 05/18/2016   Erectile dysfunction 05/18/2016   Episodic recurrent vertigo 01/20/2016   Chronic hepatitis C without hepatic coma ([place]) 06/23/2015   Essential hypertension 04/12/2015   Arthritis of knee 04/12/2015   Alcohol abuse 04/12/2015    Past Medical History: Past Medical History:  Diagnosis Date   Arthritis    Chronic hepatitis C ([place])    Hepatic cysts 12/01/2013   Seen on CT   Hypertension    Renal cysts 12/01/2013   Seen on CT    Past Surgical History: Past Surgical History:  Procedure Laterality Date   COLON SURGERY Right 09/2010   benign neoplasm of colon, s/p R colectomy   LYMPHADENECTOMY Bilateral 10/15/2020   Procedure: LYMPHADENECTOMY;  Surgeon: Alexis Frock, MD;  Location:  [facility];  Service: Urology;  Laterality: Bilateral;   REPLACEMENT TOTAL KNEE Left 07/06/2014   Left knee replacement   ROBOT ASSISTED LAPAROSCOPIC RADICAL PROSTATECTOMY N/A 10/15/2020   Procedure: XI ROBOTIC ASSISTED LAPAROSCOPIC RADICAL PROSTATECTOMY, INDOCYANINE GREEN DYE INJECTION AND ADHESIOLYSIS;  Surgeon: Alexis Frock, MD;  Location: [facility];  Service: Urology;  Laterality: N/A;  3 HRS    Social History: Social History   Tobacco Use   Smoking status: Every Day    Types: Cigarettes   Smokeless tobacco: Never  Vaping Use   Vaping Use: Never used  Substance Use Topics   Alcohol  use: Yes    Alcohol/week: 42.0 standard drinks    Types: 42 Standard drinks or equivalent per week    Comment: 3-4 vodka drinks a day   Drug use: No    Please also refer to relevant sections of EMR.  Family History: Family History  Problem Relation Age of Onset   Cancer Maternal Aunt     Allergies and Medications: No Known Allergies No current facility-administered medications on file prior to encounter.   Current Outpatient Medications on File Prior to Encounter  Medication Sig Dispense Refill   acetaminophen (TYLENOL) 500 MG tablet Take 1,000 mg by mouth every 6 (six) hours as needed for moderate pain.     amLODipine (NORVASC) 10 MG tablet TAKE 1 TABLET (10 MG TOTAL) BY MOUTH AT BEDTIME. (Patient taking differently: Take 10 mg by mouth daily.) 90 tablet 3   cefTRIAXone (ROCEPHIN) IVPB Inject 2 g into the vein daily. Indication:  vertebral ostemomyelitis/discitis First Dose: No Last Day of Therapy:  03/12/21 Labs - Once weekly:  CBC/D and BMP, Labs - Every other week:  ESR and CRP Method of administration: IV Push Method of administration may be changed at the discretion of home infusion pharmacist based upon assessment of the patient and/or caregiver's ability to self-administer the medication ordered. 43 Units 0   gabapentin (NEURONTIN) 100 MG capsule Take 1 capsule (100 mg total) by mouth 3 (three) times daily. 90 capsule 0   losartan (COZAAR) 100 MG tablet TAKE 1 TABLET (100 MG TOTAL) BY MOUTH AT BEDTIME. (Patient taking differently: Take 100 mg by mouth daily.) 90 tablet 3   meclizine (ANTIVERT) 25 MG tablet Take 1 tablet (25 mg total) by mouth daily as needed. (Patient taking differently: Take 25 mg by mouth daily as needed for dizziness.) 30 tablet 3   Oxycodone HCl 10 MG TABS Take 1 tablet (10 mg total) by mouth every 8 (eight) hours as needed for up to 5 days for moderate pain. 15 tablet 0   polyethylene glycol powder (GLYCOLAX/MIRALAX) 17 GM/SCOOP powder Take 17 g by  mouth daily. 238 g 0   vancomycin IVPB Inject 1,000 mg into the vein daily. Indication:  vertebral osteomyelitis/discitis First Dose: No Last Day of Therapy:  03/12/21 Labs - Sunday/Monday:  CBC/D, BMP, and vancomycin trough. Labs - Thursday:  BMP and vancomycin trough Labs - Every other week:  ESR and CRP Method of administration:Elastomeric Method of administration may be changed at the discretion of the patient and/or caregiver's ability to self-administer the medication ordered. 43 Units 0    Objective: BP (!) 137/95 (BP Location: Left Arm)   Pulse 89   Temp 99.7 F (37.6 C) (Oral)   Resp 18   SpO2 100%  Exam: General: 70 year old male lying in bed, no acute distress  Eyes: White sclera, clear conjunctiva ENTM: Poor dentition, edentulous upper teeth, MMM Cardiovascular: RRR, normal  S1/S2 Respiratory: CTAB, normal work of breathing Gastrointestinal: Normal bowel movements, soft, nondistended, nontender to palpation MSK: 5/5 muscle strength in BLE's, good grip strength bilaterally Derm: No rashes, skin is warm and dry Neuro: CN II through XII intact, sensation intact Psych: Mood and affect appropriate for situation  Labs and Imaging: CBC BMET  Recent Labs  Lab 01/31/21 1619  WBC 12.8*  HGB 9.5*  HCT 31.2*  PLT 353   Recent Labs  Lab 01/31/21 1619  NA 131*  K 4.4  CL 98  CO2 22  BUN 7*  CREATININE 0.96  GLUCOSE 100*  CALCIUM 9.9      Precious Gilding, DO 02/01/2021, 5:26 AM PGY-1, [facility] Intern pager: [phone], text pages welcome  FPTS Upper-Level Resident Addendum   I have independently interviewed and examined the patient. I have discussed the above with Dr.Jones and agree with the documented plan. My edits for correction/addition/clarification are included above. Please see any attending notes.   Eulis Foster, MD PGY-3, [facility] 02/01/2021 6:57 AM  FPTS Service pager: [phone] (text pages welcome  through [facility])

## 2021-02-01 NOTE — Progress Notes (Signed)
Pharmacy Antibiotic Note  Jeffrey [name] is a 70 y.o. male admitted on 01/31/2021 with  vertebral osteomyelitis and discitis .  Pharmacy has been consulted for vancomycin dosing dosing.  Vancomycin random level was subtheraputic (VR=6). SCr=0.92.   Plan: Increase vancomycin to 1750 mg Q24 hours (eAUC = 531).  Will monitor renal function, and levels as indicated.    Temp (24hrs), Avg:99.3 F (37.4 C), Min:98.8 F (37.1 C), Max:99.7 F (37.6 C)  Recent Labs  Lab 01/26/21 0729 01/26/21 0913 01/26/21 1422 01/27/21 0310 01/28/21 0454 01/31/21 1619 02/01/21 0855  WBC  --   --   --   --  5.5 12.8*  --   CREATININE 0.82  --   --  0.83 0.90 0.96  --   VANCOTROUGH  --  22*  --   --   --   --   --   VANCOPEAK  --   --  38  --   --   --   --   VANCORANDOM  --   --   --   --   --   --  6    Estimated Creatinine Clearance: 71.3 mL/min (by C-G formula based on SCr of 0.96 mg/dL).    No Known Allergies   Dose adjustments this admission: Dose increased from 1000 mg Q24h to 1750 mg Q24h  Thank you for allowing pharmacy to be a part of this patient's care.  Pauletta Browns, Pharm.D. PGY-1 Pharmacy Resident [phone] 02/01/2021 2:54 PM

## 2021-02-01 NOTE — ED Notes (Signed)
Pt stated he has a fractured ribs a few months ago, an infection of the spine, and leg spasms that cause him to not be able to walk. Pt states he is currently taking antibiotics and oxycodone.

## 2021-02-01 NOTE — ED Notes (Signed)
Patient transported to MRI 

## 2021-02-01 NOTE — Progress Notes (Addendum)
Family Medicine Teaching Service Daily Progress Note Intern Pager: [phone]  Patient name: Jazmine Longshore Medical record number: [id number] Date of birth: Sep 16, 1950 Age: 70 y.o. Gender: male  Primary Care Provider: Carollee Leitz, MD Consultants: ortho Code Status: FULL  Pt Overview and Major Events to Date:  10/11: admitted  Assessment and Plan: Jones Viviani is a 70 y.o. male presenting with severe back and leg pain with known diagnosis of vertebral osteomyelitis and discitis. PMH is significant for hypertension, prostate cancer s/p radical prostatectomy, chronic alcohol use disorder, cataracts, dyslipidemia, history of hep C and hepatic cysts.  Multilevel Osteo of T9-T10, T12-L1 with L1 compression fracture Recently hospitalized for the above condition and was discharged home with IV vanc and CTX for 6 wks. It appears this has been administered correctly. Patient reports difficulty with ADLs at home and feels that he needs a SNF, which is why he came in. During his last admission, the patient was initially evaluated by PT/OT with recs for SNF but this was changed to [facility] after patient refused SNF placement. 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 Pt with shooting pains, unchanged from last admission. No thoracic MRI obtained. Leukoctyosis of 12.8 (5.5 when discharged) and ESR of 115. Pain is currently at a 7/10, patient says this is tolerable and is in NAD. -Consult to [place] to pharmacy for vancomycin -Ceftriaxone 2 g IV daily -Tylenol 650 mg every 6 hours. -Oxycodone 5 mg every 4 hours as needed -Vancomycin blood level -OT/PT eval and treat  Hypertension BP this morning is 137/95.  Home medication includes amlodipine, losartan -Amlodipine 10 mg daily -Losartan 100  mg daily   Chronic hyponatremia Chronic hyponatremia is likely secondary to alcohol use disorder.  Sodium is 131 on admission this is up from 129 at discharge. -CTM   Constipation Patient states last bowel movement was on 01/30/2021. -MiraLAX 17 g daily twice daily -Senna 1 tablet daily   Alcohol use disorder Patient typically drinks 3 to 4 drinks daily with no history of DT or withdrawal seizures. -CIWA protocol -Thiamine 100 mg daily    Radiculopathy in bilateral lower extremities Nephropathy likely due to vitamin deficiency in setting of alcohol use disorder or secondary to spinal stenosis in context of osteomyelitis. -Gabapentin 100 mg 3 times daily   Chronic hepatitis C, genotype 1  Positive in the early 2000's and again in 2017.  Patient completed 8 weeks of [name] treatment and had work-up to include liver staging elastography which showed no cirrhosis and F2 to F3 fibrosis -Avoid hepatotoxic medications   History of benign neoplasm of colon s/p right colectomy Colectomy in 2012.  Last colonoscopy in 2018 with recommendation colonoscopies every 2 years -Recommend maintaining outpatient colonoscopy schedule   Circadian rhythm regulation -Continue melatonin 3 mg nightly  FEN/GI: Cardiac diet Prophylaxis: Lovenox Dispo: TBD, likely SNF, but has yet to work with PT/OT    Subjective:  On interview this morning patient is hopeful for SNF placement and feels this is necessary given his trouble with ADLs. He reports consistent sciatic pain. Back pain 7/10, which patient reports is tolerable.   Objective: Temp:  [99.4 F (37.4 C)-99.7 F (37.6 C)] 99.7 F (37.6 C) (10/11 0431) Pulse Rate:  [89-112] 89 (10/11 0648) Resp:  [17-18] 17 (10/11 0646) BP: (118-170)/(85-96) 120/95 (10/11 0648) SpO2:  [98 %-100 %] 98 % (  10/11 8316) Physical Exam Vitals reviewed.  Cardiovascular:     Rate and Rhythm: Normal rate and regular rhythm.  Pulmonary:     Effort: Pulmonary effort is  normal.     Breath sounds: Normal breath sounds.  Abdominal:     General: Bowel sounds are normal.     Palpations: Abdomen is soft.  Neurological:     Mental Status: He is alert.   ````````````````````````````````````````````````````````````````````````````````````  Laboratory: Recent Labs  Lab 01/28/21 0454 01/31/21 1619  WBC 5.5 12.8*  HGB 8.1* 9.5*  HCT 25.3* 31.2*  PLT 356 353   Recent Labs  Lab 01/27/21 0310 01/28/21 0454 01/31/21 1619  NA 130* 129* 131*  K 3.8 3.9 4.4  CL 97* 96* 98  CO2 $Re'25 26 22  'oZb$ BUN <5* <5* 7*  CREATININE 0.83 0.90 0.96  CALCIUM 9.5 9.6 9.9  PROT  --   --  8.0  BILITOT  --   --  1.0  ALKPHOS  --   --  90  ALT  --   --  8  AST  --   --  21  GLUCOSE 88 92 100*     Imaging/Diagnostic Tests: MRI L spine as above   Corky Sox PGY-1 [place] Intern pager: [phone], text pages welcome

## 2021-02-01 NOTE — ED Notes (Signed)
Provided pt with blanket and pillow. Pt stated he wanted a chuck under him. NT placed a chuck under pt.

## 2021-02-01 NOTE — ED Notes (Signed)
Pt states that he no longer drinks EtOH daily, has not since diagnosis with chronic illness, CIWA 0 at this time

## 2021-02-01 NOTE — ED Notes (Signed)
Pt assisted to the bathroom via wheelchair. Pt has 1 small BM.

## 2021-02-01 NOTE — Progress Notes (Signed)
Pharmacy Antibiotic Note  Jeffrey [name] is a 70 y.o. male admitted on 01/31/2021 with  vertebral osteomyelitis .  Pharmacy has been consulted for Vancomycin dosing. Pt with known osteomyelitis. Here with back pain. Discharged on 10/7 on Vancomycin and Ceftriaxone.   Plan: Will check a random vancomycin level prior to dosing Continue Ceftriaxone 2g IV q24h Trend WBC, temp, renal function     Temp (24hrs), Avg:99.6 F (37.6 C), Min:99.4 F (37.4 C), Max:99.7 F (37.6 C)  Recent Labs  Lab 01/26/21 0729 01/26/21 0913 01/26/21 1422 01/27/21 0310 01/28/21 0454 01/31/21 1619  WBC  --   --   --   --  5.5 12.8*  CREATININE 0.82  --   --  0.83 0.90 0.96  VANCOTROUGH  --  22*  --   --   --   --   VANCOPEAK  --   --  38  --   --   --     Estimated Creatinine Clearance: 71.3 mL/min (by C-G formula based on SCr of 0.96 mg/dL).    No Known Allergies  Narda Bonds, PharmD, BCPS Clinical Pharmacist Phone: [phone]

## 2021-02-01 NOTE — Progress Notes (Signed)
Occupational Therapy Evaluation  Pt seen recently by OT with recommendations for SNF for rehab, however pt declined and went home. Pt has had increased difficulty ambulating and taking care of himself due to deficits listed below.  Pt requires Assistance with ADL and mobility @ RW level and is a high risk for falls. Recommend rehab at [facility] and pt now agreeable. Will follow acutely.     02/01/21 1300  OT Visit Information  Last OT Received On 02/01/21  Assistance Needed +1 (+2 for chair follow)  PT/OT/SLP Co-Evaluation/Treatment Yes  Reason for Co-Treatment Necessary to address cognition/behavior during functional activity;For patient/therapist safety;To address functional/ADL transfers  OT goals addressed during session ADL's and self-care  History of Present Illness Pt is a 70 y/o male admitted 10/10 secondary to increased back pain. Recent admission for T9-10, and T12-L1 osteomyelitis and L1 compression fx. PMHx: HTN, HLD, prostate cancer s/p prostatectomy on 10/15/2020, alcohol use disorder, and chronic hepatitis C.  Precautions  Precautions Fall;Back  Precaution Booklet Issued No  Precaution Comments previous note states pt had lumbar corsett, however pt did not mention during session  [place] expects to be discharged to: Private residence  Living Arrangements Alone  Available Help at Discharge Family;Available PRN/intermittently  Type of Home House  Home Access Stairs to enter  Entrance Stairs-Number of Steps 3-4  Entrance Stairs-Rails Right  Home Layout One level  Bathroom Shower/Tub Walk-in Patent examiner - 2 wheels;Cane - single point  Prior Function  Comments Reports using RW since d/c from hospital, but has had increased difficulty ambulating and performing ADL tasks.  Communication  Communication No difficulties  Pain Assessment  Pain Assessment Faces  Faces Pain Scale 8  Pain Location low back radiating to RLE   Pain Descriptors / Indicators Moaning;Grimacing;Guarding  Pain Intervention(s) Limited activity within patient's tolerance  Cognition  Arousal/Alertness Awake/alert  Behavior During Therapy Agitated  Overall Cognitive Status No family/caregiver present to determine baseline cognitive functioning  General Comments Pt getting agitated throughout session. Yelling at PT/OT when he thought we couldn't understand him. Kept asking to "give me time" to perform tasks.  Upper Extremity Assessment  Upper Extremity Assessment Overall WFL for tasks assessed  Lower Extremity Assessment  Lower Extremity Assessment Defer to PT evaluation  Cervical / Trunk Assessment  Cervical / Trunk Assessment Other exceptions  Cervical / Trunk Exceptions L1 compression fx, osteomyelitis  ADL  Overall ADL's  Needs assistance/impaired  Grooming Set up;Sitting  Upper Body Bathing Set up;Sitting  Lower Body Bathing Moderate assistance;Sit to/from stand  Upper Body Dressing  Set up;Sitting  Lower Body Dressing Moderate assistance;Sit to/from Retail buyer Minimal assistance;+2 for safety/equipment  Toileting- Clothing Manipulation and Hygiene Set up  Toileting - Clothing Manipulation Details (indicate cue type and reason) for urinal management  Functional mobility during ADLs Minimal assistance;+2 for safety/equipment;Rolling walker  Bed Mobility  Overal bed mobility Needs Assistance  Bed Mobility Rolling;Sit to Sidelying;Sidelying to Sit  Rolling Min guard;+2 for safety/equipment  Sidelying to sit Mod assist;+2 for physical assistance;+2 for safety/equipment  Sit to sidelying Mod assist;+2 for physical assistance;+2 for safety/equipment  General bed mobility comments Mod A +2 for assist with LE and trunk assist. Increased time required and pt moaning in pain.  Transfers  Overall transfer level Needs assistance  Equipment used Rolling walker (2 wheeled)  Transfers Sit to/from Stand  Sit to Stand Min  guard;+2 physical assistance  General transfer comment Min guard  for safety. Pt did not want PT/OT to help him. Pulling up on RW.  Balance  Overall balance assessment Needs assistance  Sitting-balance support No upper extremity supported;Feet supported  Sitting balance-Leahy Scale Fair  Standing balance support Bilateral upper extremity supported;During functional activity  Standing balance-Leahy Scale Poor  Standing balance comment Reliant on UE and external support  General Comments  General comments (skin integrity, edema, etc.) VSS on RA  OT - End of Session  Equipment Utilized During Treatment Rolling walker  Activity Tolerance Patient limited by pain  Patient left in bed;with call bell/phone within reach  Nurse Communication Mobility status  OT Assessment  OT Recommendation/Assessment Patient needs continued OT Services  OT Visit Diagnosis Unsteadiness on feet (R26.81);Other abnormalities of gait and mobility (R26.89);Muscle weakness (generalized) (M62.81);Pain  Pain - part of body  (back; R leg)  OT Problem List Decreased strength;Decreased range of motion;Decreased activity tolerance;Impaired balance (sitting and/or standing);Decreased safety awareness;Decreased knowledge of use of DME or AE;Decreased knowledge of precautions;Impaired sensation;Pain  OT Plan  OT Frequency (ACUTE ONLY) Min 2X/week  OT Treatment/Interventions (ACUTE ONLY) Self-care/ADL training;Therapeutic exercise;DME and/or AE instruction;Therapeutic activities;Patient/family education;Balance training  AM-PAC OT "6 Clicks" Daily Activity Outcome Measure (Version 2)  Help from another person eating meals? 4  Help from another person taking care of personal grooming? 3  Help from another person toileting, which includes using toliet, bedpan, or urinal? 3  Help from another person bathing (including washing, rinsing, drying)? 2  Help from another person to put on and taking off regular upper body clothing? 3   Help from another person to put on and taking off regular lower body clothing? 2  6 Click Score 17  Progressive Mobility  What is the highest level of mobility based on the progressive mobility assessment? Level 3 (Stands with assist) - Balance while standing  and cannot march in place  Mobility Out of bed for toileting;Out of bed to chair with meals  OT Recommendation  Follow Up Recommendations SNF  OT Equipment 3 in 1 bedside commode  Individuals Consulted  Consulted and Agree with Results and Recommendations Patient  Acute Rehab OT Goals  Patient Stated Goal to reduce pain  OT Goal Formulation With patient  Time For Goal Achievement 02/15/21  Potential to Achieve Goals Good  OT Time Calculation  OT Start Time (ACUTE ONLY) 1057  OT Stop Time (ACUTE ONLY) 1116  OT Time Calculation (min) 19 min  OT General Charges  $OT Visit 1 Visit  OT Evaluation  $OT Eval Moderate Complexity 1 Mod  Written Expression  Dominant Hand Right  Maurie Boettcher, OT/L   Acute OT Clinical Specialist Acute Rehabilitation Services Pager [phone] Office [phone]

## 2021-02-01 NOTE — ED Provider Notes (Signed)
[facility] EMERGENCY DEPARTMENT Provider Note   CSN: [id number] Arrival date & time: 01/31/21  1330     History Chief Complaint  Patient presents with   Back Pain    Jeffrey [name] is a 70 y.o. male.  Patient recently discharged with PICC 2/2 osteomyelitis and discitis. Has had uncontrolled and worsening sciatica type pain related to same.  Patient is unable to get around and care for himself nor is he able to drive or do anything else secondary to the discomfort.  Patient states that the reason they wanted to put him in rehab however he did not have the resources for someone to take care of his things while he was gone but that issue has been resolved and he is okay going to rehabilitation if is needed.  No fevers.  No other new symptoms.  He is a incontinent to urine but that sounds like it is from a prostatectomy in the past and not new.   Back Pain     Past Medical History:  Diagnosis Date   Arthritis    Chronic hepatitis C (HCC)    Hepatic cysts 12/01/2013   Seen on CT   Hypertension    Renal cysts 12/01/2013   Seen on CT    Patient Active Problem List   Diagnosis Date Noted   Constipation    Sepsis (HCC) 01/24/2021   Neuropathic pain of both legs 01/24/2021   Osteomyelitis of lumbar spine (HCC) 01/24/2021   Osteomyelitis of thoracic spine (HCC) 01/23/2021   Foot swelling 12/07/2020   Compression fracture of L1 lumbar vertebra (HCC) 12/05/2020   Cellulitis of left lower extremity 12/05/2020   Bilateral low back pain without sciatica 11/26/2020   Prostate cancer (HCC) 10/15/2020   Nocturia 05/14/2020   High risk heterosexual behavior 06/15/2019   Tobacco use 10/12/2018   Acid reflux 07/25/2018   Rash and nonspecific skin eruption 03/06/2018   Liver fibrosis 02/13/2018   Tubular adenoma 12/26/2016   Left hip pain 11/26/2016   History of partial colectomy 05/18/2016   Erectile dysfunction 05/18/2016   Episodic recurrent vertigo 01/20/2016    Chronic hepatitis C without hepatic coma (HCC) 06/23/2015   Essential hypertension 04/12/2015   Arthritis of knee 04/12/2015   Alcohol abuse 04/12/2015    Past Surgical History:  Procedure Laterality Date   COLON SURGERY Right 09/2010   benign neoplasm of colon, s/p R colectomy   LYMPHADENECTOMY Bilateral 10/15/2020   Procedure: LYMPHADENECTOMY;  Surgeon: Manny, Theodore, MD;  Location: [facility];  Service: Urology;  Laterality: Bilateral;   REPLACEMENT TOTAL KNEE Left 07/06/2014   Left knee replacement   ROBOT ASSISTED LAPAROSCOPIC RADICAL PROSTATECTOMY N/A 10/15/2020   Procedure: XI ROBOTIC ASSISTED LAPAROSCOPIC RADICAL PROSTATECTOMY, INDOCYANINE GREEN DYE INJECTION AND ADHESIOLYSIS;  Surgeon: Manny, Theodore, MD;  Location: [facility];  Service: Urology;  Laterality: N/A;  3 HRS       Family History  Problem Relation Age of Onset   Cancer Maternal Aunt     Social History   Tobacco Use   Smoking status: Every Day    Types: Cigarettes   Smokeless tobacco: Never  Vaping Use   Vaping Use: Never used  Substance Use Topics   Alcohol use: Yes    Alcohol/week: 42.0 standard drinks    Types: 42 Standard drinks or equivalent per week    Comment: 3-4 vodka drinks a day   Drug use: No    Home Medications Prior to Admission medications   Medication   Sig Start Date End Date Taking? Authorizing Provider  acetaminophen (TYLENOL) 500 MG tablet Take 1,000 mg by mouth every 6 (six) hours as needed for moderate pain.    [provider]  amLODipine (NORVASC) 10 MG tablet TAKE 1 TABLET (10 MG TOTAL) BY MOUTH AT BEDTIME. Patient taking differently: Take 10 mg by mouth daily. 05/17/20 05/17/21  Olson, Daniel K, MD  cefTRIAXone (ROCEPHIN) IVPB Inject 2 g into the vein daily. Indication:  vertebral ostemomyelitis/discitis First Dose: No Last Day of Therapy:  03/12/21 Labs - Once weekly:  CBC/D and BMP, Labs - Every other week:  ESR and CRP Method of administration: IV Push Method of  administration may be changed at the discretion of home infusion pharmacist based upon assessment of the patient and/or caregiver's ability to self-administer the medication ordered. 01/28/21 03/12/21  Walsh, Tanya, MD  gabapentin (NEURONTIN) 100 MG capsule Take 1 capsule (100 mg total) by mouth 3 (three) times daily. 01/28/21   Wells, Ashleigh, MD  losartan (COZAAR) 100 MG tablet TAKE 1 TABLET (100 MG TOTAL) BY MOUTH AT BEDTIME. Patient taking differently: Take 100 mg by mouth daily. 05/17/20 05/17/21  Olson, Daniel K, MD  meclizine (ANTIVERT) 25 MG tablet Take 1 tablet (25 mg total) by mouth daily as needed. Patient taking differently: Take 25 mg by mouth daily as needed for dizziness. 05/16/19   Olson, Daniel K, MD  Oxycodone HCl 10 MG TABS Take 1 tablet (10 mg total) by mouth every 8 (eight) hours as needed for up to 5 days for moderate pain. 01/28/21 02/02/21  Wells, Ashleigh, MD  polyethylene glycol powder (GLYCOLAX/MIRALAX) 17 GM/SCOOP powder Take 17 g by mouth daily. 01/28/21   Wells, Ashleigh, MD  vancomycin IVPB Inject 1,000 mg into the vein daily. Indication:  vertebral osteomyelitis/discitis First Dose: No Last Day of Therapy:  03/12/21 Labs - Sunday/Monday:  CBC/D, BMP, and vancomycin trough. Labs - Thursday:  BMP and vancomycin trough Labs - Every other week:  ESR and CRP Method of administration:Elastomeric Method of administration may be changed at the discretion of the patient and/or caregiver's ability to self-administer the medication ordered. 01/28/21 03/12/21  Walsh, Tanya, MD    Allergies    Patient has no known allergies.  Review of Systems   Review of Systems  Musculoskeletal:  Positive for back pain.  All other systems reviewed and are negative.  Physical Exam Updated Vital Signs BP (!) 137/95 (BP Location: Left Arm)   Pulse 89   Temp 99.7 F (37.6 C) (Oral)   Resp 18   SpO2 100%   Physical Exam Vitals and nursing note reviewed.  Constitutional:      Appearance:  He is well-developed.  HENT:     Head: Normocephalic and atraumatic.     Mouth/Throat:     Mouth: Mucous membranes are moist.     Pharynx: Oropharynx is clear.  Eyes:     Pupils: Pupils are equal, round, and reactive to light.  Cardiovascular:     Rate and Rhythm: Normal rate.  Pulmonary:     Effort: Pulmonary effort is normal. No respiratory distress.  Abdominal:     General: Abdomen is flat. There is no distension.  Musculoskeletal:        General: Normal range of motion.     Cervical back: Normal range of motion.  Skin:    General: Skin is warm and dry.  Neurological:     Mental Status: He is alert.     Comments: Decreased low   ext strength seems to be pain related    ED Results / Procedures / Treatments   Labs (all labs ordered are listed, but only abnormal results are displayed) Labs Reviewed  CBC WITH DIFFERENTIAL/PLATELET - Abnormal; Notable for the following components:      Result Value   WBC 12.8 (*)    RBC 3.19 (*)    Hemoglobin 9.5 (*)    HCT 31.2 (*)    Neutro Abs 10.7 (*)    Lymphs Abs 0.6 (*)    Monocytes Absolute 1.1 (*)    All other components within normal limits  COMPREHENSIVE METABOLIC PANEL - Abnormal; Notable for the following components:   Sodium 131 (*)    Glucose, Bld 100 (*)    BUN 7 (*)    Albumin 3.1 (*)    All other components within normal limits  CK - Abnormal; Notable for the following components:   Total CK 37 (*)    All other components within normal limits  SEDIMENTATION RATE - Abnormal; Notable for the following components:   Sed Rate 115 (*)    All other components within normal limits  C-REACTIVE PROTEIN - Abnormal; Notable for the following components:   CRP 6.4 (*)    All other components within normal limits  CULTURE, BLOOD (SINGLE)  VANCOMYCIN, RANDOM    EKG None  Radiology MR Lumbar Spine W Wo Contrast  Result Date: 02/01/2021 CLINICAL DATA:  Low back pain with cauda equina syndrome suspected EXAM: MRI LUMBAR  SPINE WITHOUT AND WITH CONTRAST TECHNIQUE: Multiplanar and multiecho pulse sequences of the lumbar spine were obtained without and with intravenous contrast. CONTRAST:  7mL GADAVIST GADOBUTROL 1 MMOL/ML IV SOLN COMPARISON:  01/21/2021 FINDINGS: Segmentation:  5 lumbar type vertebrae Alignment:  Physiologic. Vertebrae: Known discitis/osteomyelitis at T12-L1 with L1 fracture causing 55% central height loss. Retropulsion that encroaches on the conus. Disc space purulence which is unchanged. Conus medullaris and cauda equina: Conus extends to the L1-2 level. Mass effect on the conus by epidural lipomatosis, L1 retropulsion, and epidural inflammation. Diffuse abnormal spinal canal enhancement which appears linear and dural by axial postcontrast images. Paraspinal and other soft tissues: Paravertebral/psoas edema and enhancement at the level of T12-L1. No paravertebral abscess. Cystic structure partially covered in the left hemipelvis measuring 17 mm, likely lymphangioma after pelvic dissection. Degenerative interspinous and periarticular edema and enhancement on the left at L3-4, L4-5 and on the right at L4-5. Disc levels: Epidural lipomatosis effaces the thecal sac throughout the lumbar canal. Notable facet osteoarthritis at L3-4 and below with above described inflammation. L3-4 and L4-5 mild disc space narrowing and bulging. IMPRESSION: 1. Discitis/osteomyelitis at T12-L1 with disc space purulence but no epidural or paravertebral abscess. There is diffuse abnormal spinal canal enhancement localizing to the dura suggesting related pachymeningitis. 2. Unchanged L1 compression fracture with retropulsion contributing to conus compression. 3. Epidural lipomatosis diffusely effaces the thecal sac. Electronically Signed   By: Jonathan  Watts M.D.   On: 02/01/2021 04:37    Procedures Procedures   Medications Ordered in ED Medications  amLODipine (NORVASC) tablet 10 mg (has no administration in time range)  losartan  (COZAAR) tablet 100 mg (has no administration in time range)  polyethylene glycol powder (GLYCOLAX/MIRALAX) container 17 g (has no administration in time range)  cefTRIAXone (ROCEPHIN) 2 g in sodium chloride 0.9 % 100 mL IVPB (has no administration in time range)  enoxaparin (LOVENOX) injection 40 mg (has no administration in time range)  acetaminophen (TYLENOL) tablet 650 mg (  has no administration in time range)    Or  acetaminophen (TYLENOL) suppository 650 mg (has no administration in time range)  oxyCODONE (Oxy IR/ROXICODONE) immediate release tablet 5 mg (has no administration in time range)  senna-docusate (Senokot-S) tablet 1 tablet (has no administration in time range)  HYDROmorphone (DILAUDID) injection 1 mg (1 mg Intravenous Given 02/01/21 0104)  methocarbamol (ROBAXIN) tablet 500 mg (500 mg Oral Given 02/01/21 0106)  gadobutrol (GADAVIST) 1 MMOL/ML injection 7 mL (7 mLs Intravenous Contrast Given 02/01/21 0408)  fentaNYL (SUBLIMAZE) injection 100 mcg (100 mcg Intravenous Given 02/01/21 0503)    ED Course  I have reviewed the triage vital signs and the nursing notes.  Pertinent labs & imaging results that were available during my care of the patient were reviewed by me and considered in my medical decision making (see chart for details).    MDM Rules/Calculators/A&P                         ESR CRP and white blood cell count are all higher than hospitalization.  Patient with significant pain hindering movement.  MRI without any frank abscess requiring neurosurgery.  I am not sure if antibiotics are the correct ones but it does not seem that his discitis osteomyelitis is improving at all.  He will discuss with family medicine for admission   Final Clinical Impression(s) / ED Diagnoses Final diagnoses:  Acute bilateral low back pain with bilateral sciatica  Osteomyelitis, unspecified site, unspecified type (HCC)    Rx / DC Orders ED Discharge Orders     None         Mesner, Jason, MD 02/01/21 0553  

## 2021-02-01 NOTE — Evaluation (Signed)
Physical Therapy Evaluation Patient Details Name: Jeffrey [name] MRN: [id number] DOB: [birth year] Today's Date: 02/01/2021  History of Present Illness  Pt is a 70 y/o male admitted 10/10 secondary to increased back pain. Recent admission for T9-10, and T12-L1 osteomyelitis and L1 compression fx. PMHx: HTN, HLD, prostate cancer s/p prostatectomy on 10/15/2020, alcohol use disorder, and chronic hepatitis C.  Clinical Impression  Pt admitted secondary to problem above with deficits below. Pt with increased back and BLE pain and required mod A +2 for bed mobility and min to min guard A +2 for transfers and taking steps at EOB. Pt very tremulous with ambulation. Pt with increased difficulty caring for himself at home. Recommending SNF level therapies at d/c. Will continue to follow acutely.        Recommendations for follow up therapy are one component of a multi-disciplinary discharge planning process, led by the attending physician.  Recommendations may be updated based on patient status, additional functional criteria and insurance authorization.  Follow Up Recommendations SNF    Equipment Recommendations  None recommended by PT    Recommendations for Other Services       Precautions / Restrictions Precautions Precautions: Fall;Back Precaution Booklet Issued: No Precaution Comments: Reviewed back precautions Restrictions Weight Bearing Restrictions: No      Mobility  Bed Mobility Overal bed mobility: Needs Assistance Bed Mobility: Rolling;Sit to Sidelying;Sidelying to Sit Rolling: Min guard;+2 for safety/equipment Sidelying to sit: Mod assist;+2 for physical assistance;+2 for safety/equipment     Sit to sidelying: Mod assist;+2 for physical assistance;+2 for safety/equipment General bed mobility comments: Mod A +2 for assist with LE and trunk assist. Increased time required and pt moaning in pain.    Transfers Overall transfer level: Needs assistance Equipment used: Rolling  walker (2 wheeled) Transfers: Sit to/from Stand Sit to Stand: Min guard;+2 physical assistance         General transfer comment: Min guard for safety. Pt did not want PT/OT to help him. Pulling up on RW.  Ambulation/Gait Ambulation/Gait assistance: Min assist;Min guard;+2 safety/equipment;+2 physical assistance Gait Distance (Feet): 2 Feet Assistive device: Rolling walker (2 wheeled) Gait Pattern/deviations: Step-through pattern;Decreased stride length     General Gait Details: Ambulated a few steps at EOB, but distance limited secondary to pain. Pt with increased tremors and requiring min A +2 for steadying.  Stairs            Wheelchair Mobility    Modified Rankin (Stroke Patients Only)       Balance Overall balance assessment: Needs assistance Sitting-balance support: No upper extremity supported;Feet supported Sitting balance-Leahy Scale: Fair     Standing balance support: Bilateral upper extremity supported;During functional activity Standing balance-Leahy Scale: Poor Standing balance comment: Reliant on UE and external support                             Pertinent Vitals/Pain Pain Assessment: Faces Faces Pain Scale: Hurts whole lot Pain Location: low back radiating to RLE Pain Descriptors / Indicators: Moaning;Grimacing;Guarding Pain Intervention(s): Limited activity within patient's tolerance;Monitored during session;Repositioned    Home Living Family/patient expects to be discharged to:: Private residence Living Arrangements: Alone Available Help at Discharge: Family;Available PRN/intermittently Type of Home: House Home Access: Stairs to enter Entrance Stairs-Rails: Right Entrance Stairs-Number of Steps: 3-4 Home Layout: One level Home Equipment: Walker - 2 wheels;Kasandra Knudsen - single point      Prior Function  Comments: Reports using RW since d/c from hospital, but has had increased difficulty ambulating and performing ADL  tasks.     Hand Dominance        Extremity/Trunk Assessment   Upper Extremity Assessment Upper Extremity Assessment: Defer to OT evaluation    Lower Extremity Assessment Lower Extremity Assessment: Generalized weakness (Reports pain from back to posterior thigh.)    Cervical / Trunk Assessment Cervical / Trunk Assessment: Other exceptions Cervical / Trunk Exceptions: L1 compression fx, osteomyelitis  Communication   Communication: No difficulties  Cognition Arousal/Alertness: Awake/alert Behavior During Therapy: Agitated Overall Cognitive Status: No family/caregiver present to determine baseline cognitive functioning                                 General Comments: Pt getting agitated throughout session. Yelling at PT/OT when he thought we couldn't understand him. Kept asking to "give me time" to perform tasks.      General Comments      Exercises     Assessment/Plan    PT Assessment Patient needs continued PT services  PT Problem List Decreased strength;Decreased activity tolerance;Decreased mobility;Decreased balance;Pain       PT Treatment Interventions Gait training;DME instruction;Stair training;Functional mobility training;Therapeutic activities;Therapeutic exercise;Balance training;Neuromuscular re-education;Patient/family education    PT Goals (Current goals can be found in the Care Plan section)  Acute Rehab PT Goals Patient Stated Goal: to reduce pain PT Goal Formulation: With patient Time For Goal Achievement: 02/15/21 Potential to Achieve Goals: Good    Frequency Min 2X/week   Barriers to discharge Decreased caregiver support      Co-evaluation PT/OT/SLP Co-Evaluation/Treatment: Yes Reason for Co-Treatment: Complexity of the patient's impairments (multi-system involvement);Necessary to address cognition/behavior during functional activity;For patient/therapist safety PT goals addressed during session: Mobility/safety with  mobility;Balance         AM-PAC PT "6 Clicks" Mobility  Outcome Measure Help needed turning from your back to your side while in a flat bed without using bedrails?: A Little Help needed moving from lying on your back to sitting on the side of a flat bed without using bedrails?: Total Help needed moving to and from a bed to a chair (including a wheelchair)?: A Little Help needed standing up from a chair using your arms (e.g., wheelchair or bedside chair)?: A Little Help needed to walk in hospital room?: A Lot Help needed climbing 3-5 steps with a railing? : Total 6 Click Score: 13    End of Session   Activity Tolerance: Patient limited by pain Patient left: in bed;with call bell/phone within reach (on stretcher in ED) Nurse Communication: Mobility status PT Visit Diagnosis: Unsteadiness on feet (R26.81);Muscle weakness (generalized) (M62.81);Difficulty in walking, not elsewhere classified (R26.2);Pain Pain - Right/Left:  (bilateral) Pain - part of body: Leg    Time: 2706-2376 PT Time Calculation (min) (ACUTE ONLY): 19 min   Charges:   PT Evaluation $PT Eval Moderate Complexity: 1 Mod          Reuel Derby, PT, DPT  Acute Rehabilitation Services  Pager: [phone] Office: [phone]   Rudean Hitt 02/01/2021, 1:11 PM

## 2021-02-02 ENCOUNTER — Other Ambulatory Visit: Payer: Self-pay

## 2021-02-02 ENCOUNTER — Encounter (HOSPITAL_COMMUNITY): Payer: Self-pay | Admitting: Family Medicine

## 2021-02-02 DIAGNOSIS — M4626 Osteomyelitis of vertebra, lumbar region: Secondary | ICD-10-CM

## 2021-02-02 DIAGNOSIS — E440 Moderate protein-calorie malnutrition: Secondary | ICD-10-CM | POA: Insufficient documentation

## 2021-02-02 DIAGNOSIS — R52 Pain, unspecified: Secondary | ICD-10-CM

## 2021-02-02 DIAGNOSIS — M5442 Lumbago with sciatica, left side: Secondary | ICD-10-CM | POA: Diagnosis not present

## 2021-02-02 DIAGNOSIS — M4624 Osteomyelitis of vertebra, thoracic region: Secondary | ICD-10-CM | POA: Diagnosis not present

## 2021-02-02 LAB — COMPREHENSIVE METABOLIC PANEL
ALT: 8 U/L (ref 0–44)
AST: 12 U/L — ABNORMAL LOW (ref 15–41)
Albumin: 2.9 g/dL — ABNORMAL LOW (ref 3.5–5.0)
Alkaline Phosphatase: 85 U/L (ref 38–126)
Anion gap: 9 (ref 5–15)
BUN: <5 mg/dL — ABNORMAL LOW (ref 8–23)
CO2: 22 mmol/L (ref 22–32)
Calcium: 9.4 mg/dL (ref 8.9–10.3)
Chloride: 98 mmol/L (ref 98–111)
Creatinine, Ser: 0.83 mg/dL (ref 0.61–1.24)
GFR, Estimated: >60 mL/min (ref >60)
Glucose, Bld: 112 mg/dL — ABNORMAL HIGH (ref 70–99)
Potassium: 3.4 mmol/L — ABNORMAL LOW (ref 3.5–5.1)
Sodium: 129 mmol/L — ABNORMAL LOW (ref 135–145)
Total Bilirubin: 0.9 mg/dL (ref 0.3–1.2)
Total Protein: 7.2 g/dL (ref 6.5–8.1)

## 2021-02-02 LAB — CBC
HCT: 28.6 % — ABNORMAL LOW (ref 39.0–52.0)
Hemoglobin: 9.1 g/dL — ABNORMAL LOW (ref 13.0–17.0)
MCH: 29.6 pg (ref 26.0–34.0)
MCHC: 31.8 g/dL (ref 30.0–36.0)
MCV: 93.2 fL (ref 80.0–100.0)
Platelets: 339 10*3/uL (ref 150–400)
RBC: 3.07 MIL/uL — ABNORMAL LOW (ref 4.22–5.81)
RDW: 14.5 % (ref 11.5–15.5)
WBC: 6.1 10*3/uL (ref 4.0–10.5)
nRBC: 0 % (ref 0.0–0.2)

## 2021-02-02 MED ORDER — CHLORHEXIDINE GLUCONATE CLOTH 2 % EX PADS
6.0000 | MEDICATED_PAD | Freq: Every day | CUTANEOUS | Status: DC
  Administered 2021-02-03 – 2021-02-04 (×2): 6 via TOPICAL

## 2021-02-02 MED ORDER — ADULT MULTIVITAMIN W/MINERALS CH
1.0000 | ORAL_TABLET | Freq: Every day | ORAL | Status: DC
  Administered 2021-02-02 – 2021-02-04 (×3): 1 via ORAL
  Filled 2021-02-02 (×3): qty 1

## 2021-02-02 MED ORDER — ENSURE ENLIVE PO LIQD
237.0000 mL | Freq: Three times a day (TID) | ORAL | Status: DC
  Administered 2021-02-02 – 2021-02-04 (×5): 237 mL via ORAL

## 2021-02-02 MED ORDER — POTASSIUM CHLORIDE 20 MEQ PO PACK
20.0000 meq | PACK | Freq: Once | ORAL | Status: AC
  Administered 2021-02-02: 20 meq via ORAL
  Filled 2021-02-02: qty 1

## 2021-02-02 MED ORDER — POLYETHYLENE GLYCOL 3350 17 G PO PACK
17.0000 g | PACK | Freq: Every day | ORAL | Status: DC | PRN
  Administered 2021-02-03: 17 g via ORAL
  Filled 2021-02-02: qty 1

## 2021-02-02 MED ORDER — GABAPENTIN 300 MG PO CAPS
300.0000 mg | ORAL_CAPSULE | Freq: Three times a day (TID) | ORAL | Status: DC
  Administered 2021-02-02 – 2021-02-04 (×7): 300 mg via ORAL
  Filled 2021-02-02 (×7): qty 1

## 2021-02-02 NOTE — Progress Notes (Signed)
Initial Nutrition Assessment  DOCUMENTATION CODES:   Non-severe (moderate) malnutrition in context of social or environmental circumstances  INTERVENTION:   -Ensure Enlive po TID, each supplement provides 350 kcal and 20 grams of protein  -MVI with minerals daily  NUTRITION DIAGNOSIS:   Moderate Malnutrition related to social / environmental circumstances as evidenced by percent weight loss, energy intake < 75% for > or equal to 1 month, mild fat depletion, mild muscle depletion, moderate muscle depletion, severe muscle depletion.  GOAL:   Patient will meet greater than or equal to 90% of their needs  MONITOR:   PO intake, Supplement acceptance, Labs, Weight trends, Skin, I & O's  REASON FOR ASSESSMENT:   Malnutrition Screening Tool    ASSESSMENT:   Jeffrey [name] is a 70 y.o. male presenting with severe back and leg pain with known diagnosis of vertebral osteomyelitis and discitis. PMH is significant for hypertension, prostate cancer s/p radical prostatectomy, chronic alcohol use disorder, cataracts, dyslipidemia, history of hep C and hepatic cysts.  Pt admitted with osteomyelitis and discitis of T9-T10. T12-L11, and L1 compression fracture.   Reviewed I/O's: +250 ml x 24 hours  UOP: 200 ml x 24 hours   Spoke with pt at bedside, who reports feeling a little better today. He shares that he has had decreased oral intake over the past month secondary to pain. He shares that he often was only able to eat one meal per day, as it was difficult for him to get up and almost impossible to stand for periods of time to cook. As a result, pt reports eating mainly "junk foods" over the past month, secondary to convenience.   Observed meal tray- pt consumed about 50% of breakfast. Noted he consumed only a few bites of omelette. Per pt, he did not eat much of the omelette, as he did not get the condiments he desired.   Pt reports his UBW is around 175%. He estimates he has lost 20# in one  month. Reviewed wt hx; pt has experienced a 11% wt loss over the past month, which is significant for time frame.   Discussed importance of good meal and supplement intake to promote healing. He is amenable to Ensure supplements and reports taking these PTA.   Medications reviewed and include miralax, senna-docusate, and thiamine.   Labs reviewed: Na: 129, K: 3.4.    NUTRITION - FOCUSED PHYSICAL EXAM:  Flowsheet Row Most Recent Value  Orbital Region No depletion  Upper Arm Region Mild depletion  Thoracic and Lumbar Region No depletion  Buccal Region Mild depletion  Temple Region Mild depletion  Clavicle Bone Region Mild depletion  Clavicle and Acromion Bone Region Mild depletion  Scapular Bone Region No depletion  Dorsal Hand No depletion  Patellar Region Severe depletion  Anterior Thigh Region Severe depletion  Posterior Calf Region Severe depletion  Edema (RD Assessment) None  Hair Reviewed  Eyes Reviewed  Mouth Reviewed  Skin Reviewed  Nails Reviewed       Diet Order:   Diet Order             Diet Heart Room service appropriate? Yes; Fluid consistency: Thin  Diet effective now                   EDUCATION NEEDS:   Education needs have been addressed  Skin:  Skin Assessment: Reviewed RN Assessment  Last BM:  02/01/21  Height:   Ht Readings from Last 1 Encounters:  02/02/21 5\' 8"  (1.727  m)    Weight:   Wt Readings from Last 1 Encounters:  02/02/21 60.5 kg    Ideal Body Weight:  70 kg  BMI:  Body mass index is 20.28 kg/m.  Estimated Nutritional Needs:   Kcal:  1900-2100  Protein:  105-120 grams  Fluid:  > 1.9 L    Jeffrey [name], RD, LDN, [place] Registered Dietitian II Certified Diabetes Care and Education Specialist Please refer to [facility] for RD and/or RD on-call/weekend/after hours pager

## 2021-02-02 NOTE — Progress Notes (Signed)
FPTS Brief Note Reviewed patient's vitals, recent notes.  Vitals:   02/01/21 2309 02/02/21 0021  BP: (!) 156/78 (!) 134/96  Pulse: 93 92  Resp: 16 16  Temp: 98.6 F (37 C) 97.6 F (36.4 C)  SpO2: 98% 100%   At this time, no change in plan from day progress note.   Eulis Foster, MD Page [phone] with questions about this patient.

## 2021-02-02 NOTE — Progress Notes (Signed)
FPTS Brief Note Reviewed patient's vitals, recent notes.  Vitals:   02/02/21 1154 02/02/21 2036  BP: 127/83 (!) 143/92  Pulse: 97 88  Resp: 17 17  Temp: 98.2 F (36.8 C) 98.3 F (36.8 C)  SpO2: 100% 100%   At this time, no change in plan from day progress note.   Eulis Foster, MD Page [phone] with questions about this patient.

## 2021-02-02 NOTE — Progress Notes (Signed)
Family Medicine Teaching Service Daily Progress Note Intern Pager: [phone]  Patient name: Jeffrey [name] Medical record number: [id number] Date of birth: 06-05-1950 Age: 70 y.o. Gender: male  Primary Care Provider: Carollee Leitz, MD Consultants: ortho Code Status: FULL   Pt Overview and Major Events to Date:  10/11: admitted   Assessment and Plan: Drayke Grabel is a 70 y.o. male presenting with severe back and leg pain with known diagnosis of vertebral osteomyelitis and discitis. PMH is significant for hypertension, prostate cancer s/p radical prostatectomy, chronic alcohol use disorder, cataracts, dyslipidemia, history of hep C and hepatic cysts.  Multilevel Osteo of T9-T10, T12-L1 with L1 compression fracture VSS, afebrile. Leukocytosis resolved. Patient found sleeping this morning, in NAD. Reports 8/10 pain when he begins to move. Has only received oxy twice over the past 24 hrs. Ortho (Dr. Lorin Mercy) reviewed imaging and was not concerned about potentially new pachymeningitis and conus compression, however, he is discussing the case with neurosurgery. PT/OT recommending SNF.  -Consult to [facility] -Consult ortho, reach out today -Ceftriaxone 2 g IV daily and vanc per pharmacy -Tylenol 650 mg every 6 hours. -Oxycodone 5 mg every 4 hours as needed -Kcl 20 meq today -OT/PT eval and trea  Hypertension Home medication includes amlodipine, losartan -Amlodipine 10 mg daily -Losartan 100 mg daily   Chronic hyponatremia Chronic hyponatremia is likely secondary to alcohol use disorder.  Na of 129 this AM. -CTM   Constipation Recent BM. -MiraLAX 17 g PRN   Alcohol use disorder Patient typically drinks 3 to 4 drinks daily with no history of DT or withdrawal seizures. -CIWA protocol -Thiamine 100 mg daily    Radiculopathy in bilateral lower extremities Nephropathy likely due to vitamin deficiency in setting of alcohol use disorder or secondary to spinal stenosis in context of  osteomyelitis. -Increase gabapentin 300 mg 3 times daily  Chronic hepatitis C, genotype 1  Positive in the early 2000's and again in 2017.  Patient completed 8 weeks of [name] treatment and had work-up to include liver staging elastography which showed no cirrhosis and F2 to F3 fibrosis -Avoid hepatotoxic medications   History of benign neoplasm of colon s/p right colectomy Colectomy in 2012.  Last colonoscopy in 2018 with recommendation colonoscopies every 2 years -Recommend maintaining outpatient colonoscopy schedule   Circadian rhythm regulation -Continue melatonin 3 mg nightly   FEN/GI: Cardiac diet Prophylaxis: Lovenox Dispo: SNF  Subjective:  On interview this morning, patient asks about SNF placement. He reports 8/10 pain with mobilization. He is updated on his care plan and all questions are answered.   Objective: Temp:  [97.6 F (36.4 C)-99.2 F (37.3 C)] 99.2 F (37.3 C) (10/12 0700) Pulse Rate:  [87-98] 87 (10/12 0700) Resp:  [14-18] 18 (10/12 0700) BP: (132-156)/(78-98) 134/90 (10/12 0700) SpO2:  [97 %-100 %] 100 % (10/12 0700) Weight:  [133 lb 6.1 oz (60.5 kg)] 133 lb 6.1 oz (60.5 kg) (10/12 0014) Physical Exam Vitals reviewed.  Cardiovascular:     Rate and Rhythm: Normal rate and regular rhythm.  Pulmonary:     Effort: Pulmonary effort is normal.     Breath sounds: Normal breath sounds.  Abdominal:     General: Bowel sounds are normal.     Palpations: Abdomen is soft.  Neurological:     Mental Status: He is alert.    Laboratory: Recent Labs  Lab 01/28/21 0454 01/31/21 1619 02/02/21 0157  WBC 5.5 12.8* 6.1  HGB 8.1* 9.5* 9.1*  HCT 25.3* 31.2* 28.6*  PLT 356  353 339   Recent Labs  Lab 01/28/21 0454 01/31/21 1619 02/02/21 0157  NA 129* 131* 129*  K 3.9 4.4 3.4*  CL 96* 98 98  CO2 26 22 22   BUN <5* 7* <5*  CREATININE 0.90 0.96 0.83  CALCIUM 9.6 9.9 9.4  PROT  --  8.0 7.2  BILITOT  --  1.0 0.9  ALKPHOS  --  90 85  ALT  --  8 8  AST  --   21 12*  GLUCOSE 92 100* 112*    Imaging/Diagnostic Tests: None new   Corky Sox PGY-1 [place] Intern pager: [phone], text pages welcome

## 2021-02-02 NOTE — Plan of Care (Signed)
  Problem: Education: Goal: Knowledge of General Education information will improve Description Including pain rating scale, medication(s)/side effects and non-pharmacologic comfort measures Outcome: Progressing   

## 2021-02-02 NOTE — Plan of Care (Signed)

## 2021-02-02 NOTE — Progress Notes (Signed)
   Subjective:    Patient reports pain as moderate and severe.episodic spasms.  He was OOB on bedside commode when I first saw him.  As outlined by PT , impulsive and does not follow mobility instructions. Brace in closet, he was not putting it on when he gets up.  MRI reviewed and discussed with patient.  Dr. Kathyrn Sheriff also reviewed scan and compared it to the old scan and we both agree there is no change.  Discussed with patient that I will take several weeks of IV antibiotics for symptoms progressively improved.  If he develops instability problems post IV treatment and surgery would be reconsidered.  Patient lost 20 pounds after his prostate surgery with poor nutrition living alone and this likely contributed to development of his discitis and osteomyelitis of his thoracic and lumbar spine.   Objective: Vital signs in last 24 hours: Temp:  [97.6 F (36.4 C)-99.2 F (37.3 C)] 98.2 F (36.8 C) (10/12 1154) Pulse Rate:  [87-98] 97 (10/12 1154) Resp:  [14-18] 17 (10/12 1154) BP: (127-156)/(78-98) 127/83 (10/12 1154) SpO2:  [97 %-100 %] 100 % (10/12 1154) Weight:  [60.5 kg] 60.5 kg (10/12 0014)  Intake/Output from previous day: 10/11 0701 - 10/12 0700 In: 450 [P.O.:100; IV Piggyback:350] Out: 200 [Urine:200] Intake/Output this shift: Total I/O In: 790 [P.O.:240; IV Piggyback:550] Out: -   Recent Labs    01/31/21 1619 02/02/21 0157  HGB 9.5* 9.1*   Recent Labs    01/31/21 1619 02/02/21 0157  WBC 12.8* 6.1  RBC 3.19* 3.07*  HCT 31.2* 28.6*  PLT 353 339   Recent Labs    01/31/21 1619 02/02/21 0157  NA 131* 129*  K 4.4 3.4*  CL 98 98  CO2 22 22  BUN 7* <5*  CREATININE 0.96 0.83  GLUCOSE 100* 112*  CALCIUM 9.9 9.4   No results for input(s): LABPT, INR in the last 72 hours.  Physical exam: Some decreased sensation anterior right thigh.  Anterior tib quads ankle dorsiflexion plantarflexion is active symmetrical and decreased primarily due to pain. No results  found.  Assessment/Plan:    Plan: Patient needs improved nutrition for healing.  He is protein malnourished.  Continue IV antibiotics will need skilled nursing facility.  He can follow-up with me in several weeks.  He needs to wear his brace and it would be good to have physical therapy review again with him correct techniques for mobility, transfers.  Marybelle Killings 02/02/2021, 3:52 PM

## 2021-02-02 NOTE — TOC Initial Note (Signed)
Transition of Care [facility]) - Initial/Assessment Note    Patient Details  Name: Jeffrey [name] MRN: [id number] Date of Birth: Jun 09, 1950  Transition of [facility]) CM/SW Contact:    Milinda Antis, [place] Phone Number: 02/02/2021, 11:33 AM  Clinical Narrative:                 CSW received consult for possible SNF placement at time of discharge. CSW spoke with patient. Patient reported that he lives home alone and would have difficulty mobilizing. Patient expressed understanding of PT recommendation and is agreeable to SNF placement at time of discharge. Patient does not have a preference.  CSW discussed insurance authorization process and will provide Medicare SNF ratings list. Patient has not received any COVID vaccines. Patient expressed being hopeful for rehab and to feel better soon. No further questions reported at this time.   Skilled Nursing Rehab Facilities-   [email or web address] Ratings out of 5 possible    Name Address  Phone # [place] Overall  [facility] [address] [place] 5 1 4 4   [facility]  [address], [place] [phone] 3 1 5 4   [facility] [place], [place] 3 1 1 1   [place], [place] 2 2 4 4   [facility] [address], [place] 3 1 2 1   [place] [place] 3 2 4 4   [facility] [address] [place] 5 1 2 2   [facility] [address] [place] 5 2 2 3   [place] [facility] [place] 5 1 2 2   [facility] [phone] [address], [place] [phone] 5 1 2 2   [facility] [address] [facility] [phone] 5 1 2 2   [place] [address] [place], [place] 3 1 1 1            Patient  Goals and CMS Choice        Expected Discharge Plan and Services                                                Prior Living Arrangements/Services                       Activities of Daily Living Home Assistive Devices/Equipment: Cane (specify quad or straight), Walker (specify type) ADL Screening (condition at time of admission) Patient's cognitive ability adequate to safely complete daily activities?: Yes Is the patient deaf or have difficulty hearing?: No Does the patient have difficulty seeing, even when wearing glasses/contacts?: No Does the patient have difficulty concentrating, remembering, or making decisions?: No Patient able to express need for assistance with ADLs?: Yes Does the patient have difficulty dressing or bathing?: Yes Independently performs ADLs?: No Communication: Independent Dressing (OT): Needs assistance Is this a change from baseline?: Pre-admission baseline Grooming: Independent Feeding: Independent Bathing: Needs assistance Is this a change from baseline?: Pre-admission baseline Toileting: Independent In/Out Bed: Independent, Needs assistance Is this a change from baseline?: Pre-admission baseline Walks in Home: Needs assistance Is this a change from baseline?: Pre-admission baseline Does the patient have difficulty walking or climbing stairs?: Yes Weakness of Legs: Both Weakness of Arms/Hands: None  Permission Sought/Granted  Emotional Assessment              Admission diagnosis:  Osteomyelitis of thoracic spine ([place]) [M46.24] Acute bilateral low back pain with bilateral sciatica [M54.42, M54.41] Osteomyelitis, unspecified site, unspecified type [facility]) [M86.9] Patient Active Problem List   Diagnosis Date Noted   Acute bilateral low back pain with bilateral sciatica    Weakness generalized    Constipation    Sepsis ([place]) 01/24/2021   Neuropathic pain of both legs 01/24/2021   Osteomyelitis  of lumbar spine ([place]) 01/24/2021   Osteomyelitis of thoracic spine ([place]) 01/23/2021   Foot swelling 12/07/2020   Compression fracture of L1 lumbar vertebra ([place]) 12/05/2020   Cellulitis of left lower extremity 12/05/2020   Bilateral low back pain without sciatica 11/26/2020   Prostate cancer ([place]) 10/15/2020   Nocturia 05/14/2020   High risk heterosexual behavior 06/15/2019   Tobacco use 10/12/2018   Acid reflux 07/25/2018   Rash and nonspecific skin eruption 03/06/2018   Liver fibrosis 02/13/2018   Tubular adenoma 12/26/2016   Left hip pain 11/26/2016   History of partial colectomy 05/18/2016   Erectile dysfunction 05/18/2016   Episodic recurrent vertigo 01/20/2016   Chronic hepatitis C without hepatic coma ([place]) 06/23/2015   Essential hypertension 04/12/2015   Arthritis of knee 04/12/2015   Alcohol abuse 04/12/2015   PCP:  Carollee Leitz, MD Pharmacy:   [facility] [address] [place] [address] Phone: [phone] Fax: [facility] [address] [place] [address] Phone: [phone] Fax: [phone]     Social Determinants of Health (SDOH) Interventions    Readmission Risk Interventions No flowsheet data found.

## 2021-02-02 NOTE — Progress Notes (Signed)
Pt arrived on unit, assessed,  call bell at bedside

## 2021-02-02 NOTE — NC FL2 (Signed)
[place] LEVEL OF CARE SCREENING TOOL     IDENTIFICATION  Patient Name: Jeffrey [name] Birthdate: 1950-09-27 Sex: male Admission Date (Current Location): 01/31/2021  [facility] and [place] Number:  Herbalist and Address:  [facility] [place], [place] [address], [place], [place] [address]      Provider Number: [id number]  Attending Physician Name and Address:  Zenia Resides, MD  Relative Name and Phone Number:  Reed,Robin Denman George)   [phone]    Current Level of Care: Hospital Recommended Level of Care: [place] Prior Approval Number:    Date Approved/Denied:   PASRR Number: [id number]  Discharge Plan: SNF    Current Diagnoses: Patient Active Problem List   Diagnosis Date Noted   Malnutrition of moderate degree 02/02/2021   Uncontrolled pain    Acute bilateral low back pain with bilateral sciatica    Weakness generalized    Constipation    Sepsis ([place]) 01/24/2021   Neuropathic pain of both legs 01/24/2021   Osteomyelitis of lumbar spine ([place]) 01/24/2021   Osteomyelitis of thoracic spine ([place]) 01/23/2021   Foot swelling 12/07/2020   Compression fracture of L1 lumbar vertebra ([place]) 12/05/2020   Cellulitis of left lower extremity 12/05/2020   Bilateral low back pain without sciatica 11/26/2020   Prostate cancer ([place]) 10/15/2020   Nocturia 05/14/2020   High risk heterosexual behavior 06/15/2019   Tobacco use 10/12/2018   Acid reflux 07/25/2018   Rash and nonspecific skin eruption 03/06/2018   Liver fibrosis 02/13/2018   Tubular adenoma 12/26/2016   Left hip pain 11/26/2016   History of partial colectomy 05/18/2016   Erectile dysfunction 05/18/2016   Episodic recurrent vertigo 01/20/2016   Chronic hepatitis C without hepatic coma ([place]) 06/23/2015   Essential hypertension 04/12/2015   Arthritis of knee 04/12/2015   Alcohol abuse 04/12/2015    Orientation RESPIRATION BLADDER Height & Weight     Self,  Time, Situation, Place  Normal Continent Weight: 133 lb 6.1 oz (60.5 kg) Height:  5\' 8"  (172.7 cm)  BEHAVIORAL SYMPTOMS/MOOD NEUROLOGICAL BOWEL NUTRITION STATUS      Continent Diet (see d/c summary)  AMBULATORY STATUS COMMUNICATION OF NEEDS Skin   Limited Assist Verbally                         Personal Care Assistance Level of Assistance  Bathing, Feeding, Dressing Bathing Assistance: Independent Feeding assistance: Independent Dressing Assistance: Limited assistance     Functional Limitations Info  Hearing, Speech, Sight Sight Info: Adequate Hearing Info: Adequate Speech Info: Adequate    SPECIAL CARE FACTORS FREQUENCY  PT (By licensed PT), OT (By licensed OT)     PT Frequency: 5x/ week OT Frequency: 5x/ week            Contractures Contractures Info: Not present    Additional Factors Info  Code Status, Allergies, Isolation Precautions Code Status Info: Full Allergies Info: NKA     Isolation Precautions Info: ESBL     Current Medications (02/02/2021):  This is the current hospital active medication list Current Facility-Administered Medications  Medication Dose Route Frequency Provider Last Rate Last Admin   acetaminophen (TYLENOL) tablet 650 mg  650 mg Oral Q6H PRN Simmons-Robinson, Makiera, MD   650 mg at 02/02/21 1439   Or   acetaminophen (TYLENOL) suppository 650 mg  650 mg Rectal Q6H PRN Simmons-Robinson, Makiera, MD       amLODipine (NORVASC) tablet 10 mg  10 mg Oral Daily Simmons-Robinson, Makiera, MD   10 mg at 02/02/21 1202   cefTRIAXone (ROCEPHIN) 2 g in sodium chloride 0.9 % 100 mL IVPB  2 g Intravenous Daily Simmons-Robinson, Makiera, MD   Stopped at 02/02/21 1231   Chlorhexidine Gluconate Cloth 2 % PADS 6 each  6 each Topical Daily Hensel, Jamal Collin, MD       enoxaparin (LOVENOX) injection 40 mg  40 mg Subcutaneous Daily Simmons-Robinson, Makiera, MD   40 mg at 02/02/21 1204   feeding supplement (ENSURE ENLIVE / ENSURE PLUS) liquid 237 mL  237  mL Oral TID BM Hensel, Jamal Collin, MD       gabapentin (NEURONTIN) capsule 300 mg  300 mg Oral TID Orvis Brill, DO       losartan (COZAAR) tablet 100 mg  100 mg Oral Daily Simmons-Robinson, Makiera, MD   100 mg at 02/02/21 1202   melatonin tablet 3 mg  3 mg Oral QHS Precious Gilding, DO   3 mg at 02/01/21 2218   multivitamin with minerals tablet 1 tablet  1 tablet Oral Daily Hensel, Jamal Collin, MD       oxyCODONE (Oxy IR/ROXICODONE) immediate release tablet 5 mg  5 mg Oral Q4H PRN Simmons-Robinson, Makiera, MD   5 mg at 02/02/21 1202   polyethylene glycol (MIRALAX / GLYCOLAX) packet 17 g  17 g Oral Daily PRN Corky Sox, MD       thiamine tablet 100 mg  100 mg Oral Daily Precious Gilding, DO   100 mg at 02/02/21 1202   vancomycin (VANCOREADY) IVPB 1750 mg/350 mL  1,750 mg Intravenous QHS Pauletta Browns, [place] at 02/02/21 1610     Discharge Medications: Please see discharge summary for a list of discharge medications.  Relevant Imaging Results:  Relevant Lab Results:   Additional Information SSN:  [id number];  NO COVID VACCINES; 5'8" 133lbs  Shamecka Hocutt F Mahir Prabhakar, LCSWA

## 2021-02-03 DIAGNOSIS — K5900 Constipation, unspecified: Secondary | ICD-10-CM | POA: Diagnosis present

## 2021-02-03 DIAGNOSIS — Z79899 Other long term (current) drug therapy: Secondary | ICD-10-CM | POA: Diagnosis not present

## 2021-02-03 DIAGNOSIS — Z8546 Personal history of malignant neoplasm of prostate: Secondary | ICD-10-CM | POA: Diagnosis not present

## 2021-02-03 DIAGNOSIS — M4800 Spinal stenosis, site unspecified: Secondary | ICD-10-CM | POA: Diagnosis present

## 2021-02-03 DIAGNOSIS — K219 Gastro-esophageal reflux disease without esophagitis: Secondary | ICD-10-CM | POA: Diagnosis present

## 2021-02-03 DIAGNOSIS — K7400 Hepatic fibrosis, unspecified: Secondary | ICD-10-CM | POA: Diagnosis present

## 2021-02-03 DIAGNOSIS — F17210 Nicotine dependence, cigarettes, uncomplicated: Secondary | ICD-10-CM | POA: Diagnosis present

## 2021-02-03 DIAGNOSIS — M5441 Lumbago with sciatica, right side: Secondary | ICD-10-CM | POA: Diagnosis present

## 2021-02-03 DIAGNOSIS — Z96652 Presence of left artificial knee joint: Secondary | ICD-10-CM | POA: Diagnosis present

## 2021-02-03 DIAGNOSIS — E785 Hyperlipidemia, unspecified: Secondary | ICD-10-CM | POA: Diagnosis present

## 2021-02-03 DIAGNOSIS — M4626 Osteomyelitis of vertebra, lumbar region: Secondary | ICD-10-CM | POA: Diagnosis present

## 2021-02-03 DIAGNOSIS — M869 Osteomyelitis, unspecified: Secondary | ICD-10-CM | POA: Diagnosis not present

## 2021-02-03 DIAGNOSIS — M4624 Osteomyelitis of vertebra, thoracic region: Secondary | ICD-10-CM | POA: Diagnosis present

## 2021-02-03 DIAGNOSIS — E569 Vitamin deficiency, unspecified: Secondary | ICD-10-CM | POA: Diagnosis present

## 2021-02-03 DIAGNOSIS — R32 Unspecified urinary incontinence: Secondary | ICD-10-CM | POA: Diagnosis present

## 2021-02-03 DIAGNOSIS — Z20822 Contact with and (suspected) exposure to covid-19: Secondary | ICD-10-CM | POA: Diagnosis present

## 2021-02-03 DIAGNOSIS — E871 Hypo-osmolality and hyponatremia: Secondary | ICD-10-CM | POA: Diagnosis present

## 2021-02-03 DIAGNOSIS — M5442 Lumbago with sciatica, left side: Secondary | ICD-10-CM | POA: Diagnosis present

## 2021-02-03 DIAGNOSIS — Z9049 Acquired absence of other specified parts of digestive tract: Secondary | ICD-10-CM | POA: Diagnosis not present

## 2021-02-03 DIAGNOSIS — E440 Moderate protein-calorie malnutrition: Secondary | ICD-10-CM | POA: Diagnosis present

## 2021-02-03 DIAGNOSIS — I10 Essential (primary) hypertension: Secondary | ICD-10-CM | POA: Diagnosis present

## 2021-02-03 DIAGNOSIS — Z9079 Acquired absence of other genital organ(s): Secondary | ICD-10-CM | POA: Diagnosis not present

## 2021-02-03 DIAGNOSIS — Z7251 High risk heterosexual behavior: Secondary | ICD-10-CM | POA: Diagnosis not present

## 2021-02-03 DIAGNOSIS — B182 Chronic viral hepatitis C: Secondary | ICD-10-CM | POA: Diagnosis present

## 2021-02-03 DIAGNOSIS — F1010 Alcohol abuse, uncomplicated: Secondary | ICD-10-CM | POA: Diagnosis present

## 2021-02-03 LAB — BASIC METABOLIC PANEL
Anion gap: 6 (ref 5–15)
BUN: 9 mg/dL (ref 8–23)
CO2: 25 mmol/L (ref 22–32)
Calcium: 9.3 mg/dL (ref 8.9–10.3)
Chloride: 99 mmol/L (ref 98–111)
Creatinine, Ser: 1.12 mg/dL (ref 0.61–1.24)
GFR, Estimated: >60 mL/min (ref >60)
Glucose, Bld: 99 mg/dL (ref 70–99)
Potassium: 4 mmol/L (ref 3.5–5.1)
Sodium: 130 mmol/L — ABNORMAL LOW (ref 135–145)

## 2021-02-03 LAB — SARS CORONAVIRUS 2 (TAT 6-24 HRS): SARS Coronavirus 2: NEGATIVE

## 2021-02-03 LAB — VANCOMYCIN, RANDOM: Vancomycin Rm: 23

## 2021-02-03 MED ORDER — POLYETHYLENE GLYCOL 3350 17 G PO PACK: 17.0000 g | PACK | Freq: Every day | ORAL | Status: DC

## 2021-02-03 MED ORDER — POLYETHYLENE GLYCOL 3350 17 G PO PACK
17.0000 g | PACK | Freq: Every day | ORAL | Status: DC
  Administered 2021-02-04: 17 g via ORAL
  Filled 2021-02-03 (×2): qty 1

## 2021-02-03 MED ORDER — VANCOMYCIN VARIABLE DOSE PER UNSTABLE RENAL FUNCTION (PHARMACIST DOSING): Status: DC

## 2021-02-03 MED ORDER — SODIUM CHLORIDE 0.9% FLUSH: 10.0000 mL | INTRAVENOUS | Status: DC | PRN

## 2021-02-03 MED ORDER — SENNA 8.6 MG PO TABS
1.0000 | ORAL_TABLET | Freq: Once | ORAL | Status: AC
  Administered 2021-02-03: 8.6 mg via ORAL
  Filled 2021-02-03: qty 1

## 2021-02-03 NOTE — Progress Notes (Signed)
Family Medicine Teaching Service Daily Progress Note Intern Pager: [phone]  Patient name: Jeffrey [name] Medical record number: [id number] Date of birth: [birth year] Age: 70 y.o. Gender: male  Primary Care Provider: Carollee Leitz, MD Consultants: ortho Code Status: FULL   Pt Overview and Major Events to Date:  10/11: admitted   Assessment and Plan: Jeffrey Heffern is a 70 y.o. male presenting with severe back and leg pain with known diagnosis of vertebral osteomyelitis and discitis. PMH is significant for hypertension, prostate cancer s/p radical prostatectomy, chronic alcohol use disorder, cataracts, dyslipidemia, history of hep C and hepatic cysts.  Multilevel Osteo of T9-T10, T12-L1 with L1 compression fracture Neurosurgery with no concern about new lumbar imaging. VSS, afebrile. Patient able to ambulate to the bathroom. Reports resting pain of 6/10, changes to an 8/10 when mobilizing.  -Ceftriaxone 2 g IV daily and vanc per pharmacy -Tylenol 650 mg every 6 hours. -Oxycodone 5 mg every 4 hours as needed -OT/PT eval and treat  Moderate malnutrition Recent wieght loss of 20lbs. -RD consult -Ensure Enlive TID -MVI  Hypertension Home medication includes amlodipine, losartan -Amlodipine 10 mg daily -Losartan 100 mg daily   Chronic hyponatremia Chronic hyponatremia is likely secondary to alcohol use disorder.    Constipation Recent BM. -MiraLAX 17 g PRN   Alcohol use disorder Patient typically drinks 3 to 4 drinks daily with no history of DT or withdrawal seizures. -CIWA protocol -Thiamine 100 mg daily    Radiculopathy in bilateral lower extremities Nephropathy likely due to vitamin deficiency in setting of alcohol use disorder or secondary to spinal stenosis in context of osteomyelitis. -Increase gabapentin 300 mg 3 times daily   Chronic hepatitis C, genotype 1  Positive in the early 2000's and again in 2017.  Patient completed 8 weeks of [name] treatment and had  work-up to include liver staging elastography which showed no cirrhosis and F2 to F3 fibrosis -Avoid hepatotoxic medications   History of benign neoplasm of colon s/p right colectomy Colectomy in 2012.  Last colonoscopy in 2018 with recommendation colonoscopies every 2 years -Recommend maintaining outpatient colonoscopy schedule   Circadian rhythm regulation -Continue melatonin 3 mg nightly   FEN/GI: Cardiac diet Prophylaxis: Lovenox Dispo: medically stable, ready for SNF  Subjective:  On interview this morning, patient reports ambulating to the bathroom. Says his pain goes from a 6/10 to an 8/10 with mobilization. He asks appropriate questions about his blood pressure.   Objective: Temp:  [98.2 F (36.8 C)-99.2 F (37.3 C)] 98.3 F (36.8 C) (10/12 2036) Pulse Rate:  [87-97] 88 (10/12 2036) Resp:  [17-18] 17 (10/12 2036) BP: (127-143)/(83-92) 143/92 (10/12 2036) SpO2:  [100 %] 100 % (10/12 2036) Physical Exam Vitals reviewed.  Cardiovascular:     Rate and Rhythm: Normal rate and regular rhythm.  Pulmonary:     Effort: Pulmonary effort is normal.     Breath sounds: Normal breath sounds.  Abdominal:     General: Bowel sounds are normal.     Palpations: Abdomen is soft.     Tenderness: There is no abdominal tenderness.  Neurological:     Mental Status: He is alert.     Laboratory: Recent Labs  Lab 01/28/21 0454 01/31/21 1619 02/02/21 0157  WBC 5.5 12.8* 6.1  HGB 8.1* 9.5* 9.1*  HCT 25.3* 31.2* 28.6*  PLT 356 353 339   Recent Labs  Lab 01/28/21 0454 01/31/21 1619 02/02/21 0157  NA 129* 131* 129*  K 3.9 4.4 3.4*  CL 96* 98 98  CO2 26 22 22   BUN <5* 7* <5*  CREATININE 0.90 0.96 0.83  CALCIUM 9.6 9.9 9.4  PROT  --  8.0 7.2  BILITOT  --  1.0 0.9  ALKPHOS  --  90 85  ALT  --  8 8  AST  --  21 12*  GLUCOSE 92 100* 112*     Imaging/Diagnostic Tests: None new   Corky Sox PGY-1 [place] Intern pager: [phone], text pages welcome

## 2021-02-03 NOTE — Progress Notes (Signed)
FPTS Interim Progress Note  S: Rounded with primary RN.  Pt complains of constipation due to his pain medication. Received Miralax today.    O: BP 139/90 (BP Location: Left Arm)   Pulse 87   Temp 98.7 F (37.1 C) (Oral)   Resp 19   Ht 5\' 8"  (1.727 m)   Wt 60.5 kg   SpO2 100%   BMI 20.28 kg/m    GEN: conversant in no acute distress RESP: speaking in full sentences without pause   A/P: Concern for consitpation Switch Miralax from PRN to daily. Pt to have additional Miralax tonight with one time dose of Senna.  See daily progress note.    Lyndee Hensen, DO PGY-3, [place] Intern pager [phone]

## 2021-02-03 NOTE — TOC Progression Note (Addendum)
Transition of Care [facility]) - Initial/Assessment Note    Patient Details  Name: Jeffrey [name] MRN: [id number] Date of Birth: 04-Feb-1951  Transition of Care [facility]) CM/SW Contact:    Milinda Antis, [place] Phone Number: 02/03/2021, 12:57 PM  Clinical Narrative:                  CSW spoke with the patient at bedside and presented bed offers. The patient would like a facility close to his home.  The patient's first choice is [facility] and then whichever other agency will accept.    Bed offers  [facility]- no beds available [facility] -unable to accept at this time [facility]- reviewing the referral.  CSW will begin insurance auth when an bed offer has been solidified.   [facility] confirmed bed offer.  CSW requested TOC CMA's begin insurance auth.         Patient Goals and CMS Choice        Expected Discharge Plan and Services                                                Prior Living Arrangements/Services                       Activities of Daily Living Home Assistive Devices/Equipment: Cane (specify quad or straight), Walker (specify type) ADL Screening (condition at time of admission) Patient's cognitive ability adequate to safely complete daily activities?: Yes Is the patient deaf or have difficulty hearing?: No Does the patient have difficulty seeing, even when wearing glasses/contacts?: No Does the patient have difficulty concentrating, remembering, or making decisions?: No Patient able to express need for assistance with ADLs?: Yes Does the patient have difficulty dressing or bathing?: Yes Independently performs ADLs?: No Communication: Independent Dressing (OT): Needs assistance Is this a change from baseline?: Pre-admission baseline Grooming: Independent Feeding: Independent Bathing: Needs assistance Is this a change from baseline?: Pre-admission baseline Toileting: Independent In/Out Bed: Independent, Needs assistance Is this  a change from baseline?: Pre-admission baseline Walks in Home: Needs assistance Is this a change from baseline?: Pre-admission baseline Does the patient have difficulty walking or climbing stairs?: Yes Weakness of Legs: Both Weakness of Arms/Hands: None  Permission Sought/Granted                  Emotional Assessment              Admission diagnosis:  Osteomyelitis of thoracic spine (HCC) [M46.24] Acute bilateral low back pain with bilateral sciatica [M54.42, M54.41] Osteomyelitis, unspecified site, unspecified type ([place]) [M86.9] Patient Active Problem List   Diagnosis Date Noted   Malnutrition of moderate degree 02/02/2021   Uncontrolled pain    Acute bilateral low back pain with bilateral sciatica    Weakness generalized    Constipation    Sepsis ([place]) 01/24/2021   Neuropathic pain of both legs 01/24/2021   Osteomyelitis of lumbar spine ([place]) 01/24/2021   Osteomyelitis of thoracic spine ([place]) 01/23/2021   Foot swelling 12/07/2020   Compression fracture of L1 lumbar vertebra ([place]) 12/05/2020   Cellulitis of left lower extremity 12/05/2020   Bilateral low back pain without sciatica 11/26/2020   Prostate cancer ([place]) 10/15/2020   Nocturia 05/14/2020   High risk heterosexual behavior 06/15/2019   Tobacco use 10/12/2018   Acid reflux 07/25/2018   Rash and nonspecific  skin eruption 03/06/2018   Liver fibrosis 02/13/2018   Tubular adenoma 12/26/2016   Left hip pain 11/26/2016   History of partial colectomy 05/18/2016   Erectile dysfunction 05/18/2016   Episodic recurrent vertigo 01/20/2016   Chronic hepatitis C without hepatic coma ([place]) 06/23/2015   Essential hypertension 04/12/2015   Arthritis of knee 04/12/2015   Alcohol abuse 04/12/2015   PCP:  Carollee Leitz, MD Pharmacy:   [facility] [address] [place] [address] Phone: [phone] Fax: [facility] [address] [place] [address] Phone: [phone] Fax: [phone]     Social Determinants of Health (SDOH) Interventions    Readmission Risk Interventions No flowsheet data found.

## 2021-02-03 NOTE — Hospital Course (Addendum)
Jeffrey [name] is a 70 y.o. male presenting with severe back and leg pain with known diagnosis of vertebral osteomyelitis and discitis. PMH is significant for hypertension, prostate cancer s/p radical prostatectomy, chronic alcohol use disorder, cataracts, dyslipidemia, history of hep C and hepatic cysts.   Multilevel Osteo of T9-T10, T12-L1 with L1 compression fracture Recently hospitalized for the above condition (etiology uncertain) and he was discharged home with IV vanc and ceftriaxone for 6 wks. It appeared this had been administered correctly with the help of his daughter. Patient reported difficulty with ADLs at home and felt that he needed a SNF, which is why he came in. During his last admission, the patient was initially evaluated by PT/OT with recs for SNF but this was changed to home health with PT after patient refused SNF placement. 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 Neurosurgery was consulted as well as orthopedics and they felt the imaging was essentially unchanged, despite the radiologist's read. Pt with shooting pains in both legs, unchanged from last admission. No thoracic MRI obtained. PT/OT worked with the patient and recommended a SNF. Patient was [facility] to [facility] Of note, his narcotics were continued from his previous admission and discharge.    Hypertension Continued home meds of Amlodipine 10 mg daily and Losartan 100 mg daily.    Chronic hyponatremia Chronic hyponatremia is likely secondary to alcohol use disorder.  Stable around 130.    Alcohol use disorder Patient typically drinks 3 to 4 drinks daily with no history of DT or withdrawal seizures. CIWA protocol with patient scoring 0's. Patient given thiamine 100 mg daily.    Radiculopathy  in bilateral lower extremities Nephropathy likely due to vitamin deficiency in setting of alcohol use disorder or secondary to spinal stenosis in context of osteomyelitis. Home Gabapentin increased to 300 mg 3 times daily.   Chronic hepatitis C, genotype 1  Positive in the early 2000's and again in 2017.  Patient completed 8 weeks of [name] treatment and had work-up to include liver staging elastography which showed no cirrhosis and F2 to F3 fibrosis.    History of benign neoplasm of colon s/p right colectomy Colectomy in 2012.  Last colonoscopy in 2018 with recommendation for colonoscopies every 2 years   Circadian rhythm regulation Continued melatonin 3 mg nightly  F/u recommendations: -Taper off narcotics

## 2021-02-03 NOTE — Plan of Care (Signed)
  Problem: Education: Goal: Knowledge of General Education information will improve Description Including pain rating scale, medication(s)/side effects and non-pharmacologic comfort measures Outcome: Progressing   Problem: Health Behavior/Discharge Planning: Goal: Ability to manage health-related needs will improve Outcome: Progressing   

## 2021-02-03 NOTE — Progress Notes (Addendum)
Pharmacy Antibiotic Note  Jeffrey [name] is a 70 y.o. male admitted on 01/31/2021 with  vertebral osteomyelitis and discitis .  Pharmacy has been consulted for vancomycin dosing dosing.  Vancomycin non steady state trough level obtained (~22 hrs since last dose) and found to be 23 which is supratherapeutic. Goal trough 15-20 when dosed based on vancomycin troughs or goal AUC 400-550 when dosed based on AUC.   Plan: Obtain vancomycin random level with AM labs Redose vancomycin as appropriate  Continue to monitor renal function  Height: 5\' 8"  (172.7 cm) Weight: 60.5 kg (133 lb 6.1 oz) IBW/kg (Calculated) : 68.4  Temp (24hrs), Avg:98.4 F (36.9 C), Min:98.2 F (36.8 C), Max:98.7 F (37.1 C)  Recent Labs  Lab 01/28/21 0454 01/31/21 1619 02/01/21 0855 02/02/21 0157 02/03/21 2044  WBC 5.5 12.8*  --  6.1  --   CREATININE 0.90 0.96  --  0.83 1.12  VANCORANDOM  --   --  6  --  23     Estimated Creatinine Clearance: 54 mL/min (by C-G formula based on SCr of 1.12 mg/dL).    No Known Allergies   Dose adjustments this admission: 10/11: Dose increased from 1000 mg Q24h to 1750 mg Q24h 10/13: VR 23 - held vancomycin  Thank you for allowing pharmacy to be a part of this patient's care.  Cristela Felt, PharmD, BCPS Clinical Pharmacist 02/03/2021 9:39 PM

## 2021-02-04 DIAGNOSIS — M869 Osteomyelitis, unspecified: Secondary | ICD-10-CM

## 2021-02-04 LAB — BASIC METABOLIC PANEL
Anion gap: 7 (ref 5–15)
BUN: 5 mg/dL — ABNORMAL LOW (ref 8–23)
CO2: 23 mmol/L (ref 22–32)
Calcium: 9.6 mg/dL (ref 8.9–10.3)
Chloride: 102 mmol/L (ref 98–111)
Creatinine, Ser: 1 mg/dL (ref 0.61–1.24)
GFR, Estimated: >60 mL/min (ref >60)
Glucose, Bld: 97 mg/dL (ref 70–99)
Potassium: 3.8 mmol/L (ref 3.5–5.1)
Sodium: 132 mmol/L — ABNORMAL LOW (ref 135–145)

## 2021-02-04 LAB — VANCOMYCIN, RANDOM: Vancomycin Rm: 17

## 2021-02-04 MED ORDER — GABAPENTIN 300 MG PO CAPS: 300.0000 mg | ORAL_CAPSULE | Freq: Three times a day (TID) | ORAL | 1 refills | Status: DC

## 2021-02-04 MED ORDER — VANCOMYCIN HCL IN DEXTROSE 1-5 GM/200ML-% IV SOLN
1000.0000 mg | INTRAVENOUS | Status: DC
  Administered 2021-02-04: 1000 mg via INTRAVENOUS
  Filled 2021-02-04: qty 200

## 2021-02-04 MED ORDER — OXYCODONE HCL 10 MG PO TABS: 10.0000 mg | ORAL_TABLET | Freq: Three times a day (TID) | ORAL | 0 refills | Status: AC | PRN

## 2021-02-04 NOTE — TOC Transition Note (Signed)
Transition of Care [facility]) - CM/SW Discharge Note   Patient Details  Name: Jeffrey [name] MRN: [id number] Date of Birth: 22-Sep-1950  Transition of Care [facility]) CM/SW Contact:  Milinda Antis, LCSWA Phone Number: 02/04/2021, 11:56 AM   Clinical Narrative:     Patient will DC [id number] Anticipated DC date: 02/04/2021 Family notified: Yes Transport by: Ocie Cornfield   Per MD patient ready for DC to SNF. RN to call report prior to discharge [phone] room 103. RN, patient, patient's family, and facility notified of DC. Discharge Summary and FL2 sent to facility. DC packet on chart. Ambulance transport requested for patient.   CSW will sign off for now as social work intervention is no longer needed. Please consult [place] again if new needs arise.          Patient Goals and CMS Choice        Discharge Placement                       Discharge Plan and Services                                     Social Determinants of Health (SDOH) Interventions     Readmission Risk Interventions No flowsheet data found.

## 2021-02-04 NOTE — Progress Notes (Signed)
Physical Therapy Treatment Patient Details Name: Jeffrey [name] MRN: [id number] DOB: 11/26/1950 Today's Date: 02/04/2021   History of Present Illness Jeffrey [name] is a 70 y.o. male  who presented to [facility] hospital on 01/23/2021 after the results of his MRI of his lumbar spine returned, which revealed evidence of discitis and osteomyelitis at T9-T10, and T12-L1 with a compression fracture of L1. The compression fx was known, due to a fall in July. PMHx: HTN, HLD, prostate cancer s/p prostatectomy on 10/15/2020, alcohol use disorder, and chronic hepatitis C.    PT Comments    Pt. Demos improved functional mobility, and is able to transfer OOB and amb in room with dec assist from PT.  However, pt. Continues to be limited by intense shooting pain down R LE that places pt. At inc risk for falls during activity.  Per pt, he has misplaced his lumbar corset when in the ED.  Lack of lumbar bracing may be contributing to inc pain with activity.  AMPAC score currently indicating home with HH, however, pt's fluctuating pain intensity is limiting his ability to manage alone in his home.  In order to safely return home, would need a family member who would be available to assist with OOB activity.   Recommendations for follow up therapy are one component of a multi-disciplinary discharge planning process, led by the attending physician.  Recommendations may be updated based on patient status, additional functional criteria and insurance authorization.  Follow Up Recommendations  SNF     Equipment Recommendations  Rolling walker with 5" wheels    Recommendations for Other Services       Precautions / Restrictions Precautions Precautions: Back Required Braces or Orthoses: Spinal Brace (Per MD notes, should have lumbar corset - however, pt. is stating he lost it in ED)     Mobility  Bed Mobility Overal bed mobility: Modified Independent Bed Mobility: Sidelying to Sit Rolling: Modified independent  (Device/Increase time) Sidelying to sit: Modified independent (Device/Increase time)       General bed mobility comments: PT reviews log rolling.  Pt. able to demo SL > sit with mod I using bed rails.  C/o inc pain in sitting position.  Maintains trunk flex. Patient Response: Cooperative  Transfers   Equipment used: Conservation officer, nature (2 wheeled) Transfers: Sit to/from Stand Sit to Stand: Min guard         General transfer comment: Pt. requires CGA for safety with transfers.  VCs for proper hand placement as pt. attempts to pull on RW.  Requires inc time once in standing due to shooting pain down R LE.  Ambulation/Gait Ambulation/Gait assistance: Min guard Gait Distance (Feet): 35 Feet Assistive device: Rolling walker (2 wheeled) Gait Pattern/deviations: Decreased step length - right;Decreased step length - left;Trunk flexed     General Gait Details: Pt. amb ~5 ft from bed before reporting 10/10 pain down R LE requiring pt. needing to stop and rest in chair.  After seated rest break, pt. requests to use bathroom.  Able to amb 10 ft to bathroom with CGA.  Unable to improve trunk flex due to inc pain with spine in neutral.  Pt. transfers onto commode with CGA.  After use of commode, pain appears to have subsided and pt. is able to amb to door of room and back to chair.  Pt. demos slow cadence with activity and PT educates pt. on using UEs to unweight R LE during gait.  Demos fair understanding.   Stairs  Wheelchair Mobility    Modified Rankin (Stroke Patients Only)       Balance           Standing balance support: Bilateral upper extremity supported;During functional activity Standing balance-Leahy Scale: Fair                              Cognition Arousal/Alertness: Awake/alert Behavior During Therapy: WFL for tasks assessed/performed Overall Cognitive Status: Within Functional Limits for tasks assessed                                  General Comments: Pt. is supine in bed when PT arrvies.  Agreeable to work with PT.  States he was able to amb down hall with MD earlier today.      Exercises      General Comments        Pertinent Vitals/Pain Pain Assessment: 0-10 Pain Score: 5  Pain Location: Reports 5/10 pain at rest but states it runs down R posterior LE.  Increases to 10/10 with activity. Pain Descriptors / Indicators: Aching;Tender;Discomfort;Penetrating;Shooting Pain Intervention(s): Limited activity within patient's tolerance;Monitored during session;Repositioned    Home Living                      Prior Function            PT Goals (current goals can now be found in the care plan section) Progress towards PT goals: Progressing toward goals    Frequency           PT Plan      Co-evaluation              AM-PAC PT "6 Clicks" Mobility   Outcome Measure  Help needed turning from your back to your side while in a flat bed without using bedrails?: A Little Help needed moving from lying on your back to sitting on the side of a flat bed without using bedrails?: A Little Help needed moving to and from a bed to a chair (including a wheelchair)?: A Little Help needed standing up from a chair using your arms (e.g., wheelchair or bedside chair)?: A Little Help needed to walk in hospital room?: A Little Help needed climbing 3-5 steps with a railing? : A Lot 6 Click Score: 17    End of Session   Activity Tolerance: Patient limited by pain Patient left: in chair;with call bell/phone within reach;with chair alarm set         Time: 1031-1105 PT Time Calculation (min) (ACUTE ONLY): 34 min  Charges:  $Gait Training: 8-22 mins $Therapeutic Activity: 8-22 mins                     Renita Brocks A. Yamaris Cummings, PT, DPT Acute Rehabilitation Services Office: [place] 02/04/2021, 11:17 AM

## 2021-02-04 NOTE — Discharge Summary (Addendum)
[facility] Discharge Summary  Patient name: Jeffrey [name] Medical record number: [id number] Date of birth: 01/22/1951 Age: 70 y.o. Gender: male Date of Admission: 01/31/2021  Date of Discharge: 02/04/2021 Admitting Physician: Zenia Resides, MD  Primary Care Provider: Carollee Leitz, MD Consultants: Orthopedics, Neurosurgery  Indication for Hospitalization: Multilevel osteomyelitis of the spine  Discharge Diagnoses/Problem List:  Multilevel osteomyelitis of the spine Hypertension Hyponatremia Alcohol use disorder Peripheral radiculopathy Chronic hepatitis C Benign neoplasm of colon s.p colectomy Circadian rhythm regulation  Disposition: SNF  Discharge Condition: Stable  Discharge Exam:  Vitals reviewed.  Cardiovascular:     Rate and Rhythm: Normal rate and regular rhythm.  Pulmonary:     Effort: Pulmonary effort is normal.  Abdominal:     General: Bowel sounds are normal.     Palpations: Abdomen is soft.     Tenderness: There is no abdominal tenderness.  Neurological:     Mental Status: He is alert.   Brief Hospital Course:  Jeffrey [name] is a 70 y.o. male presenting with severe back and leg pain with known diagnosis of vertebral osteomyelitis and discitis. PMH is significant for hypertension, prostate cancer s/p radical prostatectomy, chronic alcohol use disorder, cataracts, dyslipidemia, history of hep C and hepatic cysts.   Multilevel Osteo of T9-T10, T12-L1 with L1 compression fracture Recently hospitalized for the above condition (etiology uncertain) and he was discharged home with IV vanc and ceftriaxone for 6 wks. It appeared this had been administered correctly with the help of his daughter. Patient reported difficulty with ADLs at home and felt that he needed a SNF, which is why he came in. During his last admission, the patient was initially evaluated by PT/OT with recs for SNF but this was changed to home health with PT after  patient refused SNF placement. 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 Neurosurgery was consulted as well as orthopedics and they felt the imaging was essentially unchanged, despite the radiologist's read. Pt with shooting pains in both legs, unchanged from last admission. No thoracic MRI obtained. PT/OT worked with the patient and recommended a SNF. Patient was [facility] to [facility] Of note, his narcotics were continued from his previous admission and discharge.    Hypertension Continued home meds of Amlodipine 10 mg daily and Losartan 100 mg daily.    Chronic hyponatremia Chronic hyponatremia is likely secondary to alcohol use disorder.  Stable around 130.    Alcohol use disorder Patient typically drinks 3 to 4 drinks daily with no history of DT or withdrawal seizures. CIWA protocol with patient scoring 0's. Patient given thiamine 100 mg daily.    Radiculopathy in bilateral lower extremities Nephropathy likely due to vitamin deficiency in setting of alcohol use disorder or secondary to spinal stenosis in context of osteomyelitis. Home Gabapentin increased to 300 mg 3 times daily.   Chronic hepatitis C, genotype 1  Positive in the early 2000's and again in 2017.  Patient completed 8 weeks of [name] treatment and had work-up to include liver staging elastography which showed no cirrhosis and F2 to F3 fibrosis.    History of benign neoplasm of colon s/p right colectomy Colectomy in 2012.  Last colonoscopy in 2018 with recommendation for colonoscopies every 2 years   Circadian rhythm regulation Continued melatonin 3 mg nightly  F/u recommendations: -Taper off narcotics  Significant Procedures: Repeat lumbar spine MRI  Significant Labs and Imaging:  Recent Labs   Lab 01/31/21 1619 02/02/21 0157  WBC 12.8* 6.1  HGB 9.5* 9.1*  HCT 31.2* 28.6*  PLT 353 339   Recent Labs  Lab 01/31/21 1619 02/02/21 0157 02/03/21 2044 02/04/21 0420  NA 131* 129* 130* 132*  K 4.4 3.4* 4.0 3.8  CL 98 98 99 102  CO2 _0 GLUCOSE 100* 112* 99 97  BUN 7* <5* 9 5*  CREATININE 0.96 0.83 1.12 1.00  CALCIUM 9.9 9.4 9.3 9.6  ALKPHOS 90 85  --   --   AST 21 12*  --   --   ALT 8 8  --   --   ALBUMIN 3.1* 2.9*  --   --      Results/Tests Pending at Time of Discharge: NA  Discharge Medications:  Allergies as of 02/04/2021   No Known Allergies      Medication List     TAKE these medications    acetaminophen 500 MG tablet Commonly known as: TYLENOL Take 1,000 mg by mouth every 6 (six) hours as needed for moderate pain.   amLODipine 10 MG tablet Commonly known as: NORVASC TAKE 1 TABLET (10 MG TOTAL) BY MOUTH AT BEDTIME. What changed:  how much to take when to take this   cefTRIAXone  IVPB Commonly known as: ROCEPHIN Inject 2 g into the vein daily. Indication:  vertebral ostemomyelitis/discitis First Dose: No Last Day of Therapy:  03/12/21 Labs - Once weekly:  CBC/D and BMP, Labs - Every other week:  ESR and CRP Method of administration: IV Push Method of administration may be changed at the discretion of home infusion pharmacist based upon assessment of the patient and/or caregiver's ability to self-administer the medication ordered.   gabapentin 300 MG capsule Commonly known as: NEURONTIN Take 1 capsule (300 mg total) by mouth 3 (three) times daily. What changed:  medication strength how much to take   losartan 100 MG tablet Commonly known as: COZAAR TAKE 1 TABLET (100 MG TOTAL) BY MOUTH AT BEDTIME. What changed:  how much to take when to take this   meclizine 25 MG tablet Commonly known as: ANTIVERT Take 1 tablet (25 mg total) by mouth daily as needed. What changed: reasons to take this   Oxycodone HCl 10 MG Tabs Take 1  tablet (10 mg total) by mouth every 8 (eight) hours as needed for up to 3 days. What changed: reasons to take this   polyethylene glycol powder 17 GM/SCOOP powder Commonly known as: GLYCOLAX/MIRALAX Take 17 g by mouth daily. What changed:  when to take this reasons to take this   vancomycin  IVPB Inject 1,000 mg into the vein daily. Indication:  vertebral osteomyelitis/discitis First Dose: No Last Day of Therapy:  03/12/21 Labs - Sunday/Monday:  CBC/D, BMP, and vancomycin trough. Labs - Thursday:  BMP and vancomycin trough Labs - Every other week:  ESR and CRP Method of administration:Elastomeric Method of administration may be changed at the discretion of the patient and/or caregiver's ability to self-administer the medication ordered.        Discharge Instructions: Please refer to Patient Instructions section of EMR for full details.  Patient was counseled important signs and symptoms that should prompt return to medical care, changes in medications, dietary instructions, activity restrictions, and follow up appointments.   Follow-Up Appointments:  Follow-up Information     Marybelle Killings, MD Follow up in  4 week(s).   Specialty: Orthopedic Surgery Contact information: [place] [address] [phone]                  Corky Sox PGY-1 [place] Intern pager: [phone], text pages welcome  FPTS Upper-Level Resident Addendum   I have independently interviewed and examined the patient. I have discussed the above with the original author and agree with their documentation. Please see also any attending notes.   Carollee Leitz MD PGY-3, [facility] 02/04/2021 1:56 PM  FPTS Service pager: [phone] (text pages welcome through [facility])

## 2021-02-04 NOTE — Progress Notes (Signed)
Pharmacy Antibiotic Note  Jeffrey [name] is a 70 y.o. male admitted on 01/31/2021 with  vertebral osteomyelitis and discitis .  Pharmacy has been consulted for vancomycin dosing dosing.  Vancomycin non steady state trough level obtained (~22 hrs since last dose) and found to be 23. Vancomycin was held and a repeat random level drawn 7.5 hours after the first level with VR=17.   Plan: Vancomycin 1g q24h (eAUC=467.1) Continue to monitor renal function and clinical progress   Height: 5\' 8"  (172.7 cm) Weight: 60.5 kg (133 lb 6.1 oz) IBW/kg (Calculated) : 68.4  Temp (24hrs), Avg:98.4 F (36.9 C), Min:98.2 F (36.8 C), Max:98.7 F (37.1 C)  Recent Labs  Lab 01/31/21 1619 02/01/21 0855 02/02/21 0157 02/03/21 2044 02/04/21 0420  WBC 12.8*  --  6.1  --   --   CREATININE 0.96  --  0.83 1.12 1.00  VANCORANDOM  --    < >  --  23 17   < > = values in this interval not displayed.     Estimated Creatinine Clearance: 60.5 mL/min (by C-G formula based on SCr of 1 mg/dL).    No Known Allergies   Dose adjustments this admission: 10/11: Dose increased from 1000 mg Q24h to 1750 mg Q24h 10/13: VR 23 - held vancomycin 10/14: resumed PTA dosing of 1000 mg Q24h  Thank you for allowing pharmacy to be a part of this patient's care.  Pauletta Browns, Pharm.D. PGY-1 Pharmacy Resident [phone] 02/04/2021 1:17 PM

## 2021-02-04 NOTE — TOC CAGE-AID Note (Signed)
Transition of Care [facility]) - CAGE-AID Screening   Patient Details  Name: Jeffrey [name] MRN: [id number] Date of Birth: 04/01/1951  Transition [facility]) CM/SW Contact:    Milinda Antis, [place] Phone Number: 02/04/2021, 6:02 PM   Clinical Narrative: CSW met with the patient at bedside prior to d/c.  The patient expressed the desire to abstain from using alcohol and requested resources and assistance.  CSW contacted [place] with [facility], the SNF that the patient will transition to at d/c, to inquire about MH/SA treatment while at the facility.  CSW was informed that the facility does not have these resources in house.    CSW contacted [facility].  The agency provides intensive outpatient treatment, but cannot go into the facility while the patient is at the SNF.  CSW informed that the agency can do "check ins" with the patient and then follow him after d/c from the SNF if approved.  CSW spoke with the patient and patient agreed for CSW to submit referral.    Referral submitted to [facility] ACT team.   CAGE-AID Screening:    Have You Ever Felt You Ought to Cut Down on Your Drinking or Drug Use?: Yes Have People Annoyed You By Critizing Your Drinking Or Drug Use?: Yes Have You Felt Bad Or Guilty About Your Drinking Or Drug Use?: Yes Have You Ever Had a Drink or Used Drugs First Thing In The Morning to Steady Your Nerves or to Get Rid of a Hangover?: Yes CAGE-AID Score: 4  Substance Abuse Education Offered: Yes  Substance abuse interventions: Patient and Family Counseling, Referral to (must comment) (CSW made a referral to [facility] ACT team)

## 2021-02-05 LAB — CULTURE, BLOOD (SINGLE): Culture: NO GROWTH

## 2021-02-08 ENCOUNTER — Ambulatory Visit: Payer: Medicare Other | Admitting: Orthopaedic Surgery

## 2021-02-10 ENCOUNTER — Ambulatory Visit: Payer: Medicare Other | Admitting: Family Medicine

## 2021-02-15 ENCOUNTER — Ambulatory Visit: Payer: Medicare Other | Admitting: Orthopaedic Surgery

## 2021-02-18 ENCOUNTER — Inpatient Hospital Stay: Payer: Medicare Other | Admitting: Internal Medicine

## 2021-02-21 ENCOUNTER — Other Ambulatory Visit: Payer: Self-pay

## 2021-02-21 ENCOUNTER — Observation Stay (HOSPITAL_COMMUNITY)
Admission: EM | Admit: 2021-02-21 | Discharge: 2021-02-28 | Disposition: A | Discharge status: Skilled Nursing Facility | Payer: Medicare Other | Attending: Emergency Medicine | Admitting: Emergency Medicine

## 2021-02-21 ENCOUNTER — Encounter (HOSPITAL_COMMUNITY): Payer: Self-pay | Admitting: Emergency Medicine

## 2021-02-21 DIAGNOSIS — S32018D Other fracture of first lumbar vertebra, subsequent encounter for fracture with routine healing: Secondary | ICD-10-CM | POA: Diagnosis not present

## 2021-02-21 DIAGNOSIS — R2681 Unsteadiness on feet: Secondary | ICD-10-CM | POA: Insufficient documentation

## 2021-02-21 DIAGNOSIS — M6281 Muscle weakness (generalized): Secondary | ICD-10-CM | POA: Insufficient documentation

## 2021-02-21 DIAGNOSIS — M4624 Osteomyelitis of vertebra, thoracic region: Secondary | ICD-10-CM | POA: Diagnosis not present

## 2021-02-21 DIAGNOSIS — Z20822 Contact with and (suspected) exposure to covid-19: Secondary | ICD-10-CM | POA: Insufficient documentation

## 2021-02-21 DIAGNOSIS — E876 Hypokalemia: Secondary | ICD-10-CM | POA: Diagnosis not present

## 2021-02-21 DIAGNOSIS — M5410 Radiculopathy, site unspecified: Secondary | ICD-10-CM | POA: Diagnosis not present

## 2021-02-21 DIAGNOSIS — X58XXXD Exposure to other specified factors, subsequent encounter: Secondary | ICD-10-CM | POA: Insufficient documentation

## 2021-02-21 DIAGNOSIS — S32010D Wedge compression fracture of first lumbar vertebra, subsequent encounter for fracture with routine healing: Secondary | ICD-10-CM

## 2021-02-21 DIAGNOSIS — Z95828 Presence of other vascular implants and grafts: Secondary | ICD-10-CM

## 2021-02-21 DIAGNOSIS — M545 Low back pain, unspecified: Secondary | ICD-10-CM | POA: Diagnosis present

## 2021-02-21 DIAGNOSIS — I10 Essential (primary) hypertension: Secondary | ICD-10-CM | POA: Diagnosis not present

## 2021-02-21 DIAGNOSIS — Z79899 Other long term (current) drug therapy: Secondary | ICD-10-CM | POA: Diagnosis not present

## 2021-02-21 DIAGNOSIS — T82524A Displacement of infusion catheter, initial encounter: Secondary | ICD-10-CM

## 2021-02-21 DIAGNOSIS — M4800 Spinal stenosis, site unspecified: Secondary | ICD-10-CM

## 2021-02-21 DIAGNOSIS — Z452 Encounter for adjustment and management of vascular access device: Secondary | ICD-10-CM

## 2021-02-21 DIAGNOSIS — M869 Osteomyelitis, unspecified: Secondary | ICD-10-CM | POA: Diagnosis present

## 2021-02-21 LAB — COMPREHENSIVE METABOLIC PANEL
ALT: 8 U/L (ref 0–44)
AST: 15 U/L (ref 15–41)
Albumin: 3.8 g/dL (ref 3.5–5.0)
Alkaline Phosphatase: 86 U/L (ref 38–126)
Anion gap: 10 (ref 5–15)
BUN: 10 mg/dL (ref 8–23)
CO2: 22 mmol/L (ref 22–32)
Calcium: 10.6 mg/dL — ABNORMAL HIGH (ref 8.9–10.3)
Chloride: 103 mmol/L (ref 98–111)
Creatinine, Ser: 1.31 mg/dL — ABNORMAL HIGH (ref 0.61–1.24)
GFR, Estimated: 59 mL/min — ABNORMAL LOW (ref >60)
Glucose, Bld: 92 mg/dL (ref 70–99)
Potassium: 3.5 mmol/L (ref 3.5–5.1)
Sodium: 135 mmol/L (ref 135–145)
Total Bilirubin: 0.9 mg/dL (ref 0.3–1.2)
Total Protein: 7.8 g/dL (ref 6.5–8.1)

## 2021-02-21 LAB — CBC
HCT: 30.8 % — ABNORMAL LOW (ref 39.0–52.0)
Hemoglobin: 9.9 g/dL — ABNORMAL LOW (ref 13.0–17.0)
MCH: 30.2 pg (ref 26.0–34.0)
MCHC: 32.1 g/dL (ref 30.0–36.0)
MCV: 93.9 fL (ref 80.0–100.0)
Platelets: 337 10*3/uL (ref 150–400)
RBC: 3.28 MIL/uL — ABNORMAL LOW (ref 4.22–5.81)
RDW: 14.6 % (ref 11.5–15.5)
WBC: 9 10*3/uL (ref 4.0–10.5)
nRBC: 0 % (ref 0.0–0.2)

## 2021-02-21 LAB — LIPASE, BLOOD: Lipase: 20 U/L (ref 11–51)

## 2021-02-21 NOTE — ED Triage Notes (Signed)
Patient with known compression fracture in back and osteomyelitis.  Patient continues with back pain and states that he is not getting better.  Patient and family decided that he needed to come back to ED due to not getting better.  He also states that he is having some abdominal pain that went around his abdomen and has since gone away.

## 2021-02-21 NOTE — ED Provider Notes (Signed)
Emergency Medicine Provider Triage Evaluation Note  Jeffrey [name] , a 70 y.o. male  was evaluated in triage.  Pt complains of lower back pain x3 weeks.  Patient was seen in the emergency department on 10/11, and had an MRI done that showed discitis and osteomyelitis at T12-L1 with disc base purulence, no abscess. He presents today because his family was concerned that his back pain is worsening.  Patient is also complaining of some intermittent abdominal pain.  Of note patient is a poor historian  Review of Systems  Positive: Back pain, abdominal pain Negative: Fevers, chills, numbness, tingling, urinary retention, urinary or bowel incontinence  Physical Exam  BP (!) 160/116 (BP Location: Left Arm)   Pulse (!) 103   Temp 98.3 F (36.8 C) (Oral)   Resp 16   SpO2 99%  Gen:   Awake, no distress   Resp:  Normal effort  MSK:   Moves extremities without difficulty  Other:    Medical Decision Making  Medically screening exam initiated at 8:17 PM.  Appropriate orders placed.  Jeffrey [name] was informed that the remainder of the evaluation will be completed by another provider, this initial triage assessment does not replace that evaluation, and the importance of remaining in the ED until their evaluation is complete.     Jeffrey [name] 02/21/21 2019    Jeffrey Rank, MD 02/22/21 1527

## 2021-02-22 ENCOUNTER — Inpatient Hospital Stay: Payer: Medicare Other | Admitting: Internal Medicine

## 2021-02-22 ENCOUNTER — Emergency Department (HOSPITAL_COMMUNITY): Payer: Medicare Other

## 2021-02-22 LAB — CBC WITH DIFFERENTIAL/PLATELET
Abs Immature Granulocytes: 0.02 10*3/uL (ref 0.00–0.07)
Basophils Absolute: 0 10*3/uL (ref 0.0–0.1)
Basophils Relative: 1 %
Eosinophils Absolute: 0.2 10*3/uL (ref 0.0–0.5)
Eosinophils Relative: 3 %
HCT: 31.6 % — ABNORMAL LOW (ref 39.0–52.0)
Hemoglobin: 9.9 g/dL — ABNORMAL LOW (ref 13.0–17.0)
Immature Granulocytes: 0 %
Lymphocytes Relative: 18 %
Lymphs Abs: 1.2 10*3/uL (ref 0.7–4.0)
MCH: 30.1 pg (ref 26.0–34.0)
MCHC: 31.3 g/dL (ref 30.0–36.0)
MCV: 96 fL (ref 80.0–100.0)
Monocytes Absolute: 0.8 10*3/uL (ref 0.1–1.0)
Monocytes Relative: 13 %
Neutro Abs: 4.3 10*3/uL (ref 1.7–7.7)
Neutrophils Relative %: 65 %
Platelets: 311 10*3/uL (ref 150–400)
RBC: 3.29 MIL/uL — ABNORMAL LOW (ref 4.22–5.81)
RDW: 14.8 % (ref 11.5–15.5)
WBC: 6.6 10*3/uL (ref 4.0–10.5)
nRBC: 0 % (ref 0.0–0.2)

## 2021-02-22 LAB — BASIC METABOLIC PANEL
Anion gap: 10 (ref 5–15)
BUN: 10 mg/dL (ref 8–23)
CO2: 22 mmol/L (ref 22–32)
Calcium: 10.4 mg/dL — ABNORMAL HIGH (ref 8.9–10.3)
Chloride: 105 mmol/L (ref 98–111)
Creatinine, Ser: 1.09 mg/dL (ref 0.61–1.24)
GFR, Estimated: >60 mL/min (ref >60)
Glucose, Bld: 82 mg/dL (ref 70–99)
Potassium: 3.5 mmol/L (ref 3.5–5.1)
Sodium: 137 mmol/L (ref 135–145)

## 2021-02-22 MED ORDER — ACETAMINOPHEN 325 MG PO TABS
650.0000 mg | ORAL_TABLET | Freq: Once | ORAL | Status: AC
  Administered 2021-02-22: 650 mg via ORAL
  Filled 2021-02-22: qty 2

## 2021-02-22 MED ORDER — GADOBUTROL 1 MMOL/ML IV SOLN
6.0000 mL | Freq: Once | INTRAVENOUS | Status: AC | PRN
  Administered 2021-02-22: 6 mL via INTRAVENOUS

## 2021-02-22 MED ORDER — MORPHINE SULFATE (PF) 4 MG/ML IV SOLN
4.0000 mg | Freq: Once | INTRAVENOUS | Status: AC
  Administered 2021-02-22: 4 mg via INTRAVENOUS
  Filled 2021-02-22: qty 1

## 2021-02-22 MED ORDER — SODIUM CHLORIDE 0.9 % IV BOLUS
500.0000 mL | Freq: Once | INTRAVENOUS | Status: AC
  Administered 2021-02-22: 500 mL via INTRAVENOUS

## 2021-02-22 NOTE — ED Provider Notes (Signed)
[name]   CSN: [id number] Arrival date & time: 02/21/21  2004     History Chief Complaint  Patient presents with   Back Pain   Abdominal Pain    Jeffrey [name] is a 70 y.o. male history includes hepatitis C, hypertension, osteomyelitis of the thoracic spine, EtOH abuse.  Patient reports that he suffered a fall around 3 weeks ago and is subsequently developed a infection on his spine which she is receiving IV antibiotics and pain medication for.  Patient currently resides at [facility] care where he is receiving care however he reports that he is soon to be discharged.  Patient reports that he has had continued severe pain of his back ever since he was discharged from the hospital last month, he denies any new fall or injury or change in the nature of his pain but reports it is constant and severe worsens with movement and does not radiate.  Additional history obtained from patient's daughter [name], patient asked me to call her for supplemental history.  She reports that patient is not getting ready for discharge from [place] care but he does get somewhat confused ever since he was admitted to the hospital 3 weeks ago.  She reports he is still receiving IV antibiotics and pain medication every day.  She reports the patient has been complain about increased low back pain since arriving to [facility] care which is why he was sent into the ER today for further treatment.   HPI     Past Medical History:  Diagnosis Date   Arthritis    Chronic hepatitis C ([place])    Hepatic cysts 12/01/2013   Seen on CT   Hypertension    Renal cysts 12/01/2013   Seen on CT    Patient Active Problem List   Diagnosis Date Noted   Malnutrition of moderate degree 02/02/2021   Uncontrolled pain    Acute bilateral low back pain with bilateral sciatica    Weakness generalized    Constipation    Sepsis ([place]) 01/24/2021    Neuropathic pain of both legs 01/24/2021   Osteomyelitis ([place]) 01/24/2021   Osteomyelitis of thoracic spine ([place]) 01/23/2021   Foot swelling 12/07/2020   Compression fracture of L1 lumbar vertebra ([place]) 12/05/2020   Cellulitis of left lower extremity 12/05/2020   Bilateral low back pain without sciatica 11/26/2020   Prostate cancer ([place]) 10/15/2020   Nocturia 05/14/2020   High risk heterosexual behavior 06/15/2019   Tobacco use 10/12/2018   Acid reflux 07/25/2018   Rash and nonspecific skin eruption 03/06/2018   Liver fibrosis 02/13/2018   Tubular adenoma 12/26/2016   Left hip pain 11/26/2016   History of partial colectomy 05/18/2016   Erectile dysfunction 05/18/2016   Episodic recurrent vertigo 01/20/2016   Chronic hepatitis C without hepatic coma ([place]) 06/23/2015   Essential hypertension 04/12/2015   Arthritis of knee 04/12/2015   Alcohol abuse 04/12/2015    Past Surgical History:  Procedure Laterality Date   COLON SURGERY Right 09/2010   benign neoplasm of colon, s/p R colectomy   LYMPHADENECTOMY Bilateral 10/15/2020   Procedure: LYMPHADENECTOMY;  Surgeon: Alexis Frock, MD;  Location: [facility];  Service: Urology;  Laterality: Bilateral;   REPLACEMENT TOTAL KNEE Left 07/06/2014   Left knee replacement   ROBOT ASSISTED LAPAROSCOPIC RADICAL PROSTATECTOMY N/A 10/15/2020   Procedure: XI ROBOTIC ASSISTED LAPAROSCOPIC RADICAL PROSTATECTOMY, INDOCYANINE GREEN DYE INJECTION AND ADHESIOLYSIS;  Surgeon: Alexis Frock, MD;  Location:  [facility];  Service: Urology;  Laterality: N/A;  3 HRS       Family History  Problem Relation Age of Onset   Cancer Maternal Aunt     Social History   Tobacco Use   Smoking status: Every Day    Types: Cigarettes   Smokeless tobacco: Never  Vaping Use   Vaping Use: Never used  Substance Use Topics   Alcohol use: Yes    Alcohol/week: 42.0 standard drinks    Types: 42 Standard drinks or equivalent per week    Comment: 3-4 vodka drinks a day    Drug use: No    Home Medications Prior to Admission medications   Medication Sig Start Date End Date Taking? Authorizing Provider  acetaminophen (TYLENOL) 500 MG tablet Take 1,000 mg by mouth every 6 (six) hours as needed for moderate pain.    [provider]  amLODipine (NORVASC) 10 MG tablet TAKE 1 TABLET (10 MG TOTAL) BY MOUTH AT BEDTIME. Patient taking differently: Take 10 mg by mouth daily. 05/17/20 05/17/21  Benay Pike, MD  cefTRIAXone (ROCEPHIN) IVPB Inject 2 g into the vein daily. Indication:  vertebral ostemomyelitis/discitis First Dose: No Last Day of Therapy:  03/12/21 Labs - Once weekly:  CBC/D and BMP, Labs - Every other week:  ESR and CRP Method of administration: IV Push Method of administration may be changed at the discretion of home infusion pharmacist based upon assessment of the patient and/or caregiver's ability to self-administer the medication ordered. 01/28/21 03/12/21  Carollee Leitz, MD  gabapentin (NEURONTIN) 300 MG capsule Take 1 capsule (300 mg total) by mouth 3 (three) times daily. 02/04/21   Alcus Dad, MD  losartan (COZAAR) 100 MG tablet TAKE 1 TABLET (100 MG TOTAL) BY MOUTH AT BEDTIME. Patient taking differently: Take 100 mg by mouth daily. 05/17/20 05/17/21  Benay Pike, MD  meclizine (ANTIVERT) 25 MG tablet Take 1 tablet (25 mg total) by mouth daily as needed. Patient taking differently: Take 25 mg by mouth daily as needed for dizziness. 05/16/19   Benay Pike, MD  polyethylene glycol powder [facility]) 17 GM/SCOOP powder Take 17 g by mouth daily. Patient taking differently: Take 17 g by mouth daily as needed for mild constipation. 01/28/21   Alcus Dad, MD  vancomycin IVPB Inject 1,000 mg into the vein daily. Indication:  vertebral osteomyelitis/discitis First Dose: No Last Day of Therapy:  03/12/21 Labs - Sunday/Monday:  CBC/D, BMP, and vancomycin trough. Labs - Thursday:  BMP and vancomycin trough Labs - Every other week:   ESR and CRP Method of administration:Elastomeric Method of administration may be changed at the discretion of the patient and/or caregiver's ability to self-administer the medication ordered. 01/28/21 03/12/21  Carollee Leitz, MD    Allergies    Patient has no known allergies.  Review of Systems   Review of Systems Ten systems are reviewed and are negative for acute change except as noted in the HPI  Physical Exam Updated Vital Signs BP (!) 159/107 (BP Location: Left Arm)   Pulse (!) 104   Temp 97.6 F (36.4 C) (Oral)   Resp 16   SpO2 97%   Physical Exam Constitutional:      General: He is not in acute distress.    Appearance: Normal appearance. He is well-developed. He is not ill-appearing or diaphoretic.  HENT:     Head: Normocephalic and atraumatic.  Eyes:     General: Vision grossly intact. Gaze aligned appropriately.  Pupils: Pupils are equal, round, and reactive to light.  Neck:     Trachea: Trachea and phonation normal.  Pulmonary:     Effort: Pulmonary effort is normal. No respiratory distress.  Abdominal:     General: There is no distension.     Palpations: Abdomen is soft.     Tenderness: There is no abdominal tenderness. There is no guarding or rebound.  Musculoskeletal:        General: Normal range of motion.     Cervical back: Normal range of motion.     Comments: No midline spinal tenderness palpation.  No crepitus step-off or deformity.  Pain increases with movement of the low back.  Sensation intact to bilateral feet.  Capillary refill and pedal pulses intact bilaterally  Skin:    General: Skin is warm and dry.  Neurological:     Mental Status: He is alert.     GCS: GCS eye subscore is 4. GCS verbal subscore is 5. GCS motor subscore is 6.     Comments: Speech is clear and goal oriented, follows commands Major Cranial nerves without deficit, no facial droop Moves extremities without ataxia, coordination intact  Psychiatric:        Behavior: Behavior  normal.    ED Results / Procedures / Treatments   Labs (all labs ordered are listed, but only abnormal results are displayed) Labs Reviewed  COMPREHENSIVE METABOLIC PANEL - Abnormal; Notable for the following components:      Result Value   Creatinine, Ser 1.31 (*)    Calcium 10.6 (*)    GFR, Estimated 59 (*)    All other components within normal limits  CBC - Abnormal; Notable for the following components:   RBC 3.28 (*)    Hemoglobin 9.9 (*)    HCT 30.8 (*)    All other components within normal limits  LIPASE, BLOOD  URINALYSIS, ROUTINE W REFLEX MICROSCOPIC    EKG None  Radiology No results found.  Procedures Procedures   Medications Ordered in ED Medications  acetaminophen (TYLENOL) tablet 650 mg (650 mg Oral Given 02/22/21 1113)    ED Course  I have reviewed the triage vital signs and the nursing notes.  Pertinent labs & imaging results that were available during my care of the patient were reviewed by me and considered in my medical decision making (see chart for details).  Clinical Course as of 02/22/21 1425  Tue Feb 22, 2021  1418 [place] [BM]  [place] [BM]    Clinical Course User Index [BM] Gari Crown   MDM Rules/Calculators/A&P                           Additional history obtained from: Nursing notes from this visit. Family. Review of electronic medical records. ------------------- 70 year old male with history of osteomyelitis presented today for worsening back pain from a nursing facility he is currently receiving IV antibiotics and IV pain medication without improvement.  On my initial evaluation patient is endorsing low back pain as primary concern he denies any new trauma or injury.  Patient is a poor historian seems somewhat confused on the timeline of his care and thought that he was supposed to get his PICC line out soon.  I was able to talk to the patient's daughter reports that this is not  the case and he is to continue having IV antibiotics for 3 more weeks.  She reports the  patient has been somewhat confused since he was hospitalized for this infection in the beginning of October.  On my evaluation patient is in no acute distress he is mildly tachycardic.  He does not have a leukocytosis, his hemoglobin is 9.9 appears baseline.  CMP shows mild AKI of 1.31, no LFT elevations or gap.  Considering the worsening of his low back pain I have ordered a repeat MRI T/L spine to assess for worsening infection.  I have also ordered repeat basic labs considering initial labs were drawn at 8:43 PM last night. - Patient reassessed, resting comfortably no acute distress.  Care handoff given to [facility] abdomen PA-C at shift change.  Plan of care will be to follow-up on the MRI, reassess patient.  Final disposition per oncoming team.  Note: Portions of this report may have been transcribed using voice recognition software. Every effort was made to ensure accuracy; however, inadvertent computerized transcription errors may still be present.  Final Clinical Impression(s) / ED Diagnoses Final diagnoses:  None    Rx / DC Orders ED Discharge Orders     None        Gari Crown 02/22/21 1535    Blanchie Dessert, MD 02/28/21 1323

## 2021-02-22 NOTE — ED Notes (Signed)
Pt has asked me 5 times about the wait and when will he be able to get back to see the doctor. I have been keeping him informed

## 2021-02-22 NOTE — ED Provider Notes (Signed)
Care assumed from [facility] at shift change pending MRI T/L-spine. See his note for full HPI.  In short, patient is a 70 year old male who presents to the ED due to persistent back pain.  Patient sustained a fall a few weeks ago and developed an infection on his spine requiring admission for IV antibiotics and pain medication.  Patient states pain has been persistent ever since discharge from the hospital last month which prompted him to report to the ED for further evaluation.  Patient is currently still receiving IV antibiotics and pain medication daily at [facility] care.  Plan from previous provider: Follow-up on MRI images.  If unremarkable, patient may be discharged with PCP follow-up.  ED Course/Procedures   Clinical Course as of 02/22/21 1505  Tue Feb 22, 2021  1418 [place] [BM]  [place] [BM]    Clinical Course User Index [BM] Gari Crown   Results for orders placed or performed during the hospital encounter of 02/21/21 (from the past 24 hour(s))  CBC with Differential     Status: Abnormal   Collection Time: 02/22/21  2:21 PM  Result Value Ref Range   WBC 6.6 4.0 - 10.5 K/uL   RBC 3.29 (L) 4.22 - 5.81 MIL/uL   Hemoglobin 9.9 (L) 13.0 - 17.0 g/dL   HCT 31.6 (L) 39.0 - 52.0 %   MCV 96.0 80.0 - 100.0 fL   MCH 30.1 26.0 - 34.0 pg   MCHC 31.3 30.0 - 36.0 g/dL   RDW 14.8 11.5 - 15.5 %   Platelets 311 150 - 400 K/uL   nRBC 0.0 0.0 - 0.2 %   Neutrophils Relative % 65 %   Neutro Abs 4.3 1.7 - 7.7 K/uL   Lymphocytes Relative 18 %   Lymphs Abs 1.2 0.7 - 4.0 K/uL   Monocytes Relative 13 %   Monocytes Absolute 0.8 0.1 - 1.0 K/uL   Eosinophils Relative 3 %   Eosinophils Absolute 0.2 0.0 - 0.5 K/uL   Basophils Relative 1 %   Basophils Absolute 0.0 0.0 - 0.1 K/uL   Immature Granulocytes 0 %   Abs Immature Granulocytes 0.02 0.00 - 0.07 K/uL  Basic metabolic panel     Status: Abnormal   Collection Time: 02/22/21  2:21  PM  Result Value Ref Range   Sodium 137 135 - 145 mmol/L   Potassium 3.5 3.5 - 5.1 mmol/L   Chloride 105 98 - 111 mmol/L   CO2 22 22 - 32 mmol/L   Glucose, Bld 82 70 - 99 mg/dL   BUN 10 8 - 23 mg/dL   Creatinine, Ser 1.09 0.61 - 1.24 mg/dL   Calcium 10.4 (H) 8.9 - 10.3 mg/dL   GFR, Estimated >60 >60 mL/min   Anion gap 10 5 - 15    Procedures  MDM  Care assumed from [facility], PA-C at shift change pending MRI images.  MRI T/L spine personally reviewed which demonstrates: IMPRESSION:  1. Persistent T12-L1 discitis-osteomyelitis with slight progression  of L1 compression fracture height loss and slightly decreased size  of intradiscal abscess.  2. Unchanged mild diffuse dural enhancement that is likely reactive.  No epidural abscess.  3. Unchanged moderate spinal canal stenosis and severe bilateral  neural foraminal stenosis at T12-L1.   IMPRESSION:  Persistent T9-10 and T12-L1 discitis-osteomyelitis with slightly  decreased amount of purulent material within the disc space.   10:27 PM Discussed with Family Medicine teaching practice who agrees to admit  patient for further treatment. COVID test ordered.       Karie Kirks 02/22/21 2232    Valarie Merino, MD 02/22/21 2300

## 2021-02-22 NOTE — ED Notes (Signed)
MRI stated pt has about another hour wait.

## 2021-02-22 NOTE — ED Notes (Signed)
Given Happy meal with drink

## 2021-02-22 NOTE — ED Notes (Signed)
MRI has been contacted and updated about pt.

## 2021-02-22 NOTE — ED Notes (Signed)
Attempted to draw blood from PICC line unsuccessful. Pt is a hard stick contacted phlebotomy.

## 2021-02-22 NOTE — ED Notes (Signed)
Patient transported to MRI 

## 2021-02-22 NOTE — ED Notes (Signed)
Pt states Morphine causes his pain to intensify.

## 2021-02-23 ENCOUNTER — Observation Stay (HOSPITAL_COMMUNITY): Payer: Medicare Other

## 2021-02-23 ENCOUNTER — Ambulatory Visit: Payer: Medicare Other | Admitting: Orthopaedic Surgery

## 2021-02-23 ENCOUNTER — Observation Stay: Payer: Self-pay

## 2021-02-23 DIAGNOSIS — T82524A Displacement of infusion catheter, initial encounter: Secondary | ICD-10-CM | POA: Diagnosis not present

## 2021-02-23 DIAGNOSIS — M869 Osteomyelitis, unspecified: Secondary | ICD-10-CM

## 2021-02-23 LAB — CBC
HCT: 29 % — ABNORMAL LOW (ref 39.0–52.0)
Hemoglobin: 9.5 g/dL — ABNORMAL LOW (ref 13.0–17.0)
MCH: 30.2 pg (ref 26.0–34.0)
MCHC: 32.8 g/dL (ref 30.0–36.0)
MCV: 92.1 fL (ref 80.0–100.0)
Platelets: 286 10*3/uL (ref 150–400)
RBC: 3.15 MIL/uL — ABNORMAL LOW (ref 4.22–5.81)
RDW: 14.5 % (ref 11.5–15.5)
WBC: 6.5 10*3/uL (ref 4.0–10.5)
nRBC: 0 % (ref 0.0–0.2)

## 2021-02-23 LAB — VANCOMYCIN, TROUGH: Vancomycin Tr: 12 ug/mL — ABNORMAL LOW (ref 15–20)

## 2021-02-23 LAB — URINALYSIS, ROUTINE W REFLEX MICROSCOPIC
Bilirubin Urine: NEGATIVE
Glucose, UA: NEGATIVE mg/dL
Hgb urine dipstick: NEGATIVE
Ketones, ur: NEGATIVE mg/dL
Leukocytes,Ua: NEGATIVE
Nitrite: NEGATIVE
Protein, ur: NEGATIVE mg/dL
Specific Gravity, Urine: 1.012 (ref 1.005–1.030)
pH: 7 (ref 5.0–8.0)

## 2021-02-23 LAB — BASIC METABOLIC PANEL
Anion gap: 9 (ref 5–15)
BUN: 6 mg/dL — ABNORMAL LOW (ref 8–23)
CO2: 23 mmol/L (ref 22–32)
Calcium: 9.8 mg/dL (ref 8.9–10.3)
Chloride: 100 mmol/L (ref 98–111)
Creatinine, Ser: 0.86 mg/dL (ref 0.61–1.24)
GFR, Estimated: >60 mL/min (ref >60)
Glucose, Bld: 97 mg/dL (ref 70–99)
Potassium: 3.1 mmol/L — ABNORMAL LOW (ref 3.5–5.1)
Sodium: 132 mmol/L — ABNORMAL LOW (ref 135–145)

## 2021-02-23 LAB — RESP PANEL BY RT-PCR (FLU A&B, COVID) ARPGX2
Influenza A by PCR: NEGATIVE
Influenza B by PCR: NEGATIVE
SARS Coronavirus 2 by RT PCR: NEGATIVE

## 2021-02-23 MED ORDER — ACETAMINOPHEN 325 MG PO TABS
650.0000 mg | ORAL_TABLET | Freq: Four times a day (QID) | ORAL | Status: DC | PRN
  Administered 2021-02-25 – 2021-02-28 (×10): 650 mg via ORAL
  Filled 2021-02-23 (×10): qty 2

## 2021-02-23 MED ORDER — SODIUM CHLORIDE 0.9 % IV SOLN
2.0000 g | INTRAVENOUS | Status: DC
  Administered 2021-02-23 – 2021-02-28 (×6): 2 g via INTRAVENOUS
  Filled 2021-02-23 (×6): qty 20

## 2021-02-23 MED ORDER — GABAPENTIN 300 MG PO CAPS
300.0000 mg | ORAL_CAPSULE | Freq: Three times a day (TID) | ORAL | Status: DC
  Administered 2021-02-23 – 2021-02-28 (×17): 300 mg via ORAL
  Filled 2021-02-23 (×17): qty 1

## 2021-02-23 MED ORDER — CEFTRIAXONE IV (FOR PTA / DISCHARGE USE ONLY): 2.0000 g | INTRAVENOUS | Status: DC

## 2021-02-23 MED ORDER — OXYCODONE HCL 5 MG PO TABS
5.0000 mg | ORAL_TABLET | Freq: Four times a day (QID) | ORAL | Status: DC | PRN
  Administered 2021-02-23 – 2021-02-28 (×16): 5 mg via ORAL
  Filled 2021-02-23 (×17): qty 1

## 2021-02-23 MED ORDER — POLYETHYLENE GLYCOL 3350 17 G PO PACK
17.0000 g | PACK | Freq: Every day | ORAL | Status: DC
  Administered 2021-02-23 – 2021-02-28 (×6): 17 g via ORAL
  Filled 2021-02-23 (×6): qty 1

## 2021-02-23 MED ORDER — SODIUM CHLORIDE 0.9% FLUSH
10.0000 mL | Freq: Two times a day (BID) | INTRAVENOUS | Status: DC
  Administered 2021-02-23 – 2021-02-25 (×4): 10 mL

## 2021-02-23 MED ORDER — VANCOMYCIN HCL IN DEXTROSE 1-5 GM/200ML-% IV SOLN
1000.0000 mg | INTRAVENOUS | Status: DC
  Administered 2021-02-23 – 2021-02-25 (×3): 1000 mg via INTRAVENOUS
  Filled 2021-02-23 (×3): qty 200

## 2021-02-23 MED ORDER — MECLIZINE HCL 25 MG PO TABS
25.0000 mg | ORAL_TABLET | Freq: Every day | ORAL | Status: DC | PRN
  Filled 2021-02-23: qty 1

## 2021-02-23 MED ORDER — VANCOMYCIN IV (FOR PTA / DISCHARGE USE ONLY): 1000.0000 mg | INTRAVENOUS | Status: DC

## 2021-02-23 MED ORDER — LOSARTAN POTASSIUM 50 MG PO TABS
100.0000 mg | ORAL_TABLET | Freq: Every day | ORAL | Status: DC
  Administered 2021-02-23 – 2021-02-28 (×6): 100 mg via ORAL
  Filled 2021-02-23 (×6): qty 2

## 2021-02-23 MED ORDER — ENSURE ENLIVE PO LIQD
237.0000 mL | Freq: Two times a day (BID) | ORAL | Status: DC
  Administered 2021-02-23 – 2021-02-28 (×11): 237 mL via ORAL

## 2021-02-23 MED ORDER — CHLORHEXIDINE GLUCONATE CLOTH 2 % EX PADS
6.0000 | MEDICATED_PAD | Freq: Every day | CUTANEOUS | Status: DC
  Administered 2021-02-23 – 2021-02-28 (×5): 6 via TOPICAL

## 2021-02-23 MED ORDER — AMLODIPINE BESYLATE 10 MG PO TABS
10.0000 mg | ORAL_TABLET | Freq: Every day | ORAL | Status: DC
  Administered 2021-02-23 – 2021-02-28 (×6): 10 mg via ORAL
  Filled 2021-02-23 (×6): qty 1

## 2021-02-23 MED ORDER — ENOXAPARIN SODIUM 40 MG/0.4ML IJ SOSY
40.0000 mg | PREFILLED_SYRINGE | INTRAMUSCULAR | Status: DC
  Administered 2021-02-23 – 2021-02-28 (×6): 40 mg via SUBCUTANEOUS
  Filled 2021-02-23 (×6): qty 0.4

## 2021-02-23 NOTE — Progress Notes (Signed)
MD paged re: malpositioned PICC. Requesting order to replace PICC.

## 2021-02-23 NOTE — Progress Notes (Signed)
Pt has single lumen picc on the rt upper arm. CXR done to check picc tip placement and tip shows PICC line catheter is looped at the level of the proximal clavicle. RN unit notified to inform MD.

## 2021-02-23 NOTE — Progress Notes (Signed)
FPTS Interim Progress Note  S:Patient denies pain today, well-managed without Morphine. States that he did not like nurses and roommate at SNF, so he would not like to return there. Says that with Morphine, he was constipated, so in constant pain. Has been voiding appropriately.   O: BP (!) 157/95 (BP Location: Left Arm)   Pulse 88   Temp 97.6 F (36.4 C) (Oral)   Resp 18   Ht 5' 7.99" (1.727 m)   Wt 64.5 kg   SpO2 100%   BMI 21.63 kg/m   General: Thin man with poor dentition, appearing in no acute distress Cardiology: Pulmonary: Normal effort, no tachypnea Extremities: Normal ROM Neurological: A&O x 4 Psych: Normal mood; no depression or anxiety reported.  A/P: -Continue IV 2 g CTX every 24 hours - Continue IV vancomycin 1 g every 24 hours - Pain control regimen: Tylenol 650 mg every 6 hours as needed mild to moderate pain, oxycodone 5 mg every 4 hours as needed severe pain - IV team consulted; will flush PICC line, repeat CXR, then determine need to exchange or place new PICC. - Pharmacy to adjust Vancomycin, trough 12.  Rosezetta Schlatter, MD 02/23/2021, 7:44 AM PGY-1, [facility] Medicine Service pager [phone]

## 2021-02-23 NOTE — Plan of Care (Signed)
  Problem: Education: Goal: Knowledge of General Education information will improve Description: Including pain rating scale, medication(s)/side effects and non-pharmacologic comfort measures Outcome: Progressing   Problem: Health Behavior/Discharge Planning: Goal: Ability to manage health-related needs will improve Outcome: Progressing   Problem: Clinical Measurements: Goal: Cardiovascular complication will be avoided Outcome: Progressing   Problem: Activity: Goal: Risk for activity intolerance will decrease Outcome: Progressing   Problem: Nutrition: Goal: Adequate nutrition will be maintained Outcome: Progressing   Problem: Elimination: Goal: Will not experience complications related to urinary retention Outcome: Progressing   Problem: Elimination: Goal: Will not experience complications related to bowel motility Outcome: Progressing   Problem: Pain Managment: Goal: General experience of comfort will improve Outcome: Progressing   Problem: Safety: Goal: Ability to remain free from injury will improve Outcome: Progressing

## 2021-02-23 NOTE — Progress Notes (Signed)
Peripherally Inserted Central Catheter Placement  The IV Nurse has discussed with the patient and/or persons authorized to consent for the patient, the purpose of this procedure and the potential benefits and risks involved with this procedure.  The benefits include less needle sticks, lab draws from the catheter, and the patient may be discharged home with the catheter. Risks include, but not limited to, infection, bleeding, blood clot (thrombus formation), and puncture of an artery; nerve damage and irregular heartbeat and possibility to perform a PICC exchange if needed/ordered by physician.  Alternatives to this procedure were also discussed.  Bard Power PICC patient education guide, fact sheet on infection prevention and patient information card has been provided to patient /or left at bedside.    PICC Placement Documentation  PICC Single Lumen 26-Feb-2021 Left Brachial 44 cm 0 cm (Active)  Indication for Insertion or Continuance of Line Prolonged intravenous therapies 02/26/2021 1512  Exposed Catheter (cm) 0 cm 02-26-2021 1512  Site Assessment Clean;Dry;Intact 2021-02-26 1512  Line Status Flushed;Saline locked;Dead end cap in place 26-Feb-2021 1512  Dressing Type Transparent February 26, 2021 1512  Dressing Status Clean;Dry;Intact 02-26-2021 1512  Antimicrobial disc in place? Yes 02-26-21 1512  Dressing Intervention New dressing;Other (Comment) 02-26-2021 1512  Dressing Change Due 03/02/21 02/26/2021 1512       Christella Noa Albarece 2021-02-26, 3:13 PM

## 2021-02-23 NOTE — Progress Notes (Signed)
Pharmacy Antibiotic Note  Jeffrey [name] is a 70 y.o. male admitted on 02/21/2021 with  osteomyelitis . Patient was admitted 10/2 and diagnosed with osteomyelitis. He was discharged on and discharged on 10/7 on IV ceftriaxone 2g q 24 + IV vancomycin 1000 mg BID for 6 week treatment duration course (03/12/2021). He was re-admitted on 10/10 for severe back pain and discharged to SNF on 10/14 on IV vancomycin 1000 mg daily. Re-admitted again on 10/31, now resolved. Pharmacy has been consulted for vancomycin dosing.  Vancomycin trough is 12 today and subtherapeutic (goal ~15). Per med rec, last vancomycin dose given 10/30 and per [facility] report, patient may have missed 2 doses. Scr at baseline of 1.09. Patient remains afebrile. Patient is stable and shows no signs of worsening infection.   Plan: Vancomycin 1000 mg IV q 24h Consider vancomycin trough 11/5  pending discharge plans Follow-up weekly vanc troughs on discharge Monitor renal function and clinical progress  Height: 5' 7.99" (172.7 cm) Weight: 64.5 kg (142 lb 3.2 oz) IBW/kg (Calculated) : 68.38  Temp (24hrs), Avg:97.8 F (36.6 C), Min:97.6 F (36.4 C), Max:98 F (36.7 C)  Recent Labs  Lab 02/21/21 2043 02/22/21 1421 02/23/21 0050  WBC 9.0 6.6  --   CREATININE 1.31* 1.09  --   VANCOTROUGH  --   --  12*    Estimated Creatinine Clearance: 59.2 mL/min (by C-G formula based on SCr of 1.09 mg/dL).    No Known Allergies  Antimicrobials this admission: CTX 2 g IV q 24h 11/2 >>  Vancomycin 1000 mg IV q 24h 11/2 >>   Dose adjustments this admission: None  Thank you for involving pharmacy in this patient's care.  Elita Quick, PharmD PGY1 Ambulatory Care Pharmacy Resident 02/23/2021 11:24 AM  **Pharmacist phone directory can be found on [email or web address] listed under [place]**

## 2021-02-23 NOTE — H&P (Addendum)
[facility] Admission History and Physical Service Pager: [phone]  Patient name: Jeffrey [name] Medical record number: [id number] Date of birth: 18-Feb-1951 Age: 70 y.o. Gender: male  Primary Care Provider: Carollee Leitz, MD Consultants: None Code Status: Full  Preferred Emergency Contact: Jeffrey [name], [phone]  Chief Complaint: Placement concern  Assessment and Plan: Jeffrey [name] is a 70 y.o. male presenting from SNF, with dissatisfaction of current care facility, receiving 6 weeks IV Vancomycin/CTX for multilevel osteomyelitis and compression fracture. PMH is significant for HTN, chronic hyponatremia, ETOH use disorder, chronic hepatitis C, radiculopathy in BL lower extremities, hx of benign neoplasm of colon s/p right colectomy.  Multilevel Osteo of T9-T10, T12-L1 with L1 compression fracture  Placement concern Patient presents today because he is not satisfied with SNF [facility]) where he is currently residing. He notes his pain has not been significantly worsening, but that the morphine he received at the SNF made him more nauseated/worsened his pain.  He was most recently admitted from 10/11-10/14 for difficulty with ADLs at home and felt he needed a SNF. At that time, he had severe back and leg pain and a known diagnosis of vertebral osteomyelitis and discitis for which he was receiving long-term antibiotics via PICC line. PT/OT recommended SNF and he was discharged to [facility]. In the ED, he presented hemodynamically stable with no clinical concern for worsening infection. MRI lumbar spine repeated today and showed persistent T12-L1 discitis/osteomyelitis with slight progression of L1 compression fracture height loss and slightly decreased size of intradiscal abscess; no epidural abscess. MRI thoracic spine today revealed persistent T9-T10 and T12-L1 discitis-osteomyelitis with slightly decreased amount of purulent material within the disc  space. Will manage pain acutely with regimen below, and work with TOC/SW to help patient find placement at different SNF (if PT/OT continue with this recommendation) that is satisfactory for him. - Admit to FPTS observation med-surg, attending Dr. Madison Hickman - VS per floor protocol - Pain control regimen: Tylenol 650 q6h PRN for mild-moderate pain, Oxycodone 63m q4h PRN for severe pain - Continue IV 2g CTX q24h - Continue IV Vancomycin 1g q24h - Miralax - Vancomycin trough level - Consult to TOC - PT/OT eval and treat  Hypertension Chronic, stable. Home medications: Amlodipine 133mdaily, Losartan 10012maily - Continue home meds  Chronic hyponatremia No hyponatremia noted on admission, Na 137. Prior labs show Na 129-132. - Monitor with labs  ETOH use disorder No history DT/withdrawal seizures. Unlikely ETOH consumption while in SNF. - CIWA protocol q12h  Radiculopathy in bilateral lower extremities Chronic, stable. MRI without worsening stenosis. Home medications: Gabapentin 300m75mD - Continue home meds  Chronic hepatitis C, genotype 1 Patient positive in early 2000s and again in 2017 completed 8 weeks [name] treatment with liver staging elastography revealing no cirrhosis and F2 to F3 fibrosis.  History of benign neoplasm of colon s/p colectomy in 2012 Last colonoscopy in 2018 with recommendation for colonoscopies every 2 years.  FEN/GI: Heart healthy/carb modified Prophylaxis: Lovenox  Disposition: Med-surg  History of Present Illness:  Jeffrey [name] 70 y30. male presenting with SNF placement concerns. [facility]. He voices that he would prefer to be in the hospital throughout the duration of the rest of his IV PICC antibiotic treatment for osteomyelitis. He denies any significant worsening of pain in lower back, but does say it hurts more with movement/twisting. He had been working  with  PT/OT at his SNF which was going well, per Jeffrey [name]. He states he is interested in a home health aide to cook and clean.  Review Of Systems: Per HPI with the following additions:   Review of Systems  Constitutional:  Negative for chills and fever.  Respiratory:  Negative for shortness of breath and wheezing.   Cardiovascular:  Negative for chest pain.  Gastrointestinal:  Negative for diarrhea, nausea and vomiting.  Musculoskeletal:  Positive for back pain.  Neurological:  Negative for dizziness, light-headedness and numbness.    Patient Active Problem List   Diagnosis Date Noted   Malnutrition of moderate degree 02/02/2021   Uncontrolled pain    Acute bilateral low back pain with bilateral sciatica    Weakness generalized    Constipation    Sepsis ([place]) 01/24/2021   Neuropathic pain of both legs 01/24/2021   Osteomyelitis ([place]) 01/24/2021   Osteomyelitis of thoracic spine ([place]) 01/23/2021   Foot swelling 12/07/2020   Compression fracture of L1 lumbar vertebra ([place]) 12/05/2020   Cellulitis of left lower extremity 12/05/2020   Bilateral low back pain without sciatica 11/26/2020   Prostate cancer ([place]) 10/15/2020   Nocturia 05/14/2020   High risk heterosexual behavior 06/15/2019   Tobacco use 10/12/2018   Acid reflux 07/25/2018   Rash and nonspecific skin eruption 03/06/2018   Liver fibrosis 02/13/2018   Tubular adenoma 12/26/2016   Left hip pain 11/26/2016   History of partial colectomy 05/18/2016   Erectile dysfunction 05/18/2016   Episodic recurrent vertigo 01/20/2016   Chronic hepatitis C without hepatic coma ([place]) 06/23/2015   Essential hypertension 04/12/2015   Arthritis of knee 04/12/2015   Alcohol abuse 04/12/2015    Past Medical History: Past Medical History:  Diagnosis Date   Arthritis    Chronic hepatitis C ([place])    Hepatic cysts 12/01/2013   Seen on CT   Hypertension    Renal cysts 12/01/2013   Seen on CT    Past Surgical History: Past Surgical  History:  Procedure Laterality Date   COLON SURGERY Right 09/2010   benign neoplasm of colon, s/p R colectomy   LYMPHADENECTOMY Bilateral 10/15/2020   Procedure: LYMPHADENECTOMY;  Surgeon: Alexis Frock, MD;  Location: [facility];  Service: Urology;  Laterality: Bilateral;   REPLACEMENT TOTAL KNEE Left 07/06/2014   Left knee replacement   ROBOT ASSISTED LAPAROSCOPIC RADICAL PROSTATECTOMY N/A 10/15/2020   Procedure: XI ROBOTIC ASSISTED LAPAROSCOPIC RADICAL PROSTATECTOMY, INDOCYANINE GREEN DYE INJECTION AND ADHESIOLYSIS;  Surgeon: Alexis Frock, MD;  Location: [facility];  Service: Urology;  Laterality: N/A;  3 HRS    Social History: Social History   Tobacco Use   Smoking status: Every Day    Types: Cigarettes   Smokeless tobacco: Never  Vaping Use   Vaping Use: Never used  Substance Use Topics   Alcohol use: Yes    Alcohol/week: 42.0 standard drinks    Types: 42 Standard drinks or equivalent per week    Comment: 3-4 vodka drinks a day   Drug use: No    Family History: Family History  Problem Relation Age of Onset   Cancer Maternal Aunt    Allergies and Medications: No Known Allergies No current facility-administered medications on file prior to encounter.   Current Outpatient Medications on File Prior to Encounter  Medication Sig Dispense Refill   acetaminophen (TYLENOL) 500 MG tablet Take 1,000 mg by mouth every 6 (six) hours as needed for moderate pain.     amLODipine (NORVASC) 10  MG tablet TAKE 1 TABLET (10 MG TOTAL) BY MOUTH AT BEDTIME. (Patient taking differently: Take 10 mg by mouth daily.) 90 tablet 3   cefTRIAXone (ROCEPHIN) IVPB Inject 2 g into the vein daily. Indication:  vertebral ostemomyelitis/discitis First Dose: No Last Day of Therapy:  03/12/21 Labs - Once weekly:  CBC/D and BMP, Labs - Every other week:  ESR and CRP Method of administration: IV Push Method of administration may be changed at the discretion of home infusion pharmacist based upon assessment of  the patient and/or caregiver's ability to self-administer the medication ordered. 43 Units 0   gabapentin (NEURONTIN) 300 MG capsule Take 1 capsule (300 mg total) by mouth 3 (three) times daily. 90 capsule 1   losartan (COZAAR) 100 MG tablet TAKE 1 TABLET (100 MG TOTAL) BY MOUTH AT BEDTIME. (Patient taking differently: Take 100 mg by mouth daily.) 90 tablet 3   meclizine (ANTIVERT) 25 MG tablet Take 1 tablet (25 mg total) by mouth daily as needed. (Patient taking differently: Take 25 mg by mouth daily as needed for dizziness.) 30 tablet 3   polyethylene glycol powder (GLYCOLAX/MIRALAX) 17 GM/SCOOP powder Take 17 g by mouth daily. (Patient taking differently: Take 17 g by mouth daily as needed for mild constipation.) 238 g 0   vancomycin IVPB Inject 1,000 mg into the vein daily. Indication:  vertebral osteomyelitis/discitis First Dose: No Last Day of Therapy:  03/12/21 Labs - Sunday/Monday:  CBC/D, BMP, and vancomycin trough. Labs - Thursday:  BMP and vancomycin trough Labs - Every other week:  ESR and CRP Method of administration:Elastomeric Method of administration may be changed at the discretion of the patient and/or caregiver's ability to self-administer the medication ordered. 43 Units 0    Objective: BP (!) 168/100 (BP Location: Left Arm)   Pulse 86   Temp 98 F (36.7 C) (Oral)   Resp 19   SpO2 98%  Exam: General: Well-appearing gentleman laying in stretcher, in no distress Eyes: EOMI Neck: Supple, normal ROM Cardiovascular: RRR Respiratory: CTAB in all fields, no increased work of breathing Gastrointestinal: Soft, non-tender, non-distended abdomen MSK: No joint deformities Derm: 1+ pitting edema in left foot, trace pitting edema in bilateral lower extremities Neuro: No focal neurologic deficits Psych: Requires frequent redirection in conversation. Speech is clear and fluent. Mood and affect normal  Labs and Imaging: CBC BMET  Recent Labs  Lab 02/22/21 1421  WBC 6.6  HGB  9.9*  HCT 31.6*  PLT 311   Recent Labs  Lab 02/22/21 1421  NA 137  K 3.5  CL 105  CO2 22  BUN 10  CREATININE 1.09  GLUCOSE 82  CALCIUM 10.4*      Dameron, Luna Fuse, DO 02/23/2021, 12:19 AM PGY-1, [facility] pager: [phone], text pages welcome  Upper Level Addendum: I have seen and evaluated this patient and reviewed the above note, making necessary revisions as appropriate. These are denoted by green text. I agree with the medical decision making and physical exam as noted above. Ezequiel Essex, MD PGY-2 [facility]

## 2021-02-23 NOTE — Progress Notes (Addendum)
   02/23/21 1615  Clinical Encounter Type  Visited With Patient (Via phone)  Visit Type Initial  Referral From Nurse  Consult/Referral To Chaplain   Chaplain responded to request for an Advance Directive. The patient is under airborne precautions. This chaplain spoke with the patient over the phone. Provided A.D. education and advise we can complete process after he is off precaution. Patient said he did not want A.D. until he talk with his daughter. Prayer offered. This note was prepared by Jeanine Luz, M.Div..  For questions please contact by phone [phone].

## 2021-02-23 NOTE — Progress Notes (Signed)
Attempted Right upper arm PICC exchange, unsuccessful. New PICC inserted to the left upper with no problem. Patient tolerated the procedure well.

## 2021-02-23 NOTE — Progress Notes (Signed)
After Power flushing the PICC ,as per chest Chest Xray result -no change in PICC tip  position remain curled near R subclavian vein. New PICC will be inserted.

## 2021-02-23 NOTE — Progress Notes (Signed)
OT Cancellation Note  Patient Details Name: Jeffrey [name] MRN: [id number] DOB: [birth year]   Cancelled Treatment:    Reason Eval/Treat Not Completed: Fatigue/lethargy limiting ability to participate. Also, Pt reports that his lumbar brace has been missing. He will need a replacement. OT will continue to follow for evaluation.  [place] 02/23/2021, 11:37 AM  Jesse Sans OTR/L Acute Rehabilitation Services Pager: [phone] Office: [phone]

## 2021-02-23 NOTE — Progress Notes (Signed)
Orthopedic Tech Progress Note Patient Details:  Jeffrey [name] [birth year] [id number]  RN called requesting a LSO BRACE, patient was discharged with a BACK BRACE 2weeks ago per patient, I asked her to ask the love one if someone could bring his other back up here, she's going to reach out to family and if not call back so patient can be service   Patient ID: Jeffrey [name], male   DOB: [birth year], 70 y.o.   MRN: [id number]  [name] 02/23/2021, 12:09 PM

## 2021-02-23 NOTE — Progress Notes (Signed)
Patient PICC looped at the level of proximal clavicle as shown in chest C xray.Power flushed done.MD aware.For Chest Xray.

## 2021-02-23 NOTE — Evaluation (Addendum)
Physical Therapy Evaluation Patient Details Name: Jeffrey [name] MRN: [id number] DOB: Feb 16, 1951 Today's Date: 02/23/2021  History of Present Illness  Pt is 70 yo male presenting from SNF on 01/24/21 with dissatisfaction of facility, concern for his care, and malpositioned PICC.  Pt with recent multilevel osteomylietis T9-10, T 12-L1, and L 1 compression fx.  He was at [facility] for 6 weeks IV antibiotics and therapy. Pt with medical hx including HTN, chronic hyponatremia, ETOH use disorder, chronic hepatitis C, radiculopathy in BL lower extremities, hx of benign neoplasm of colon s/p right colectomy.   Clinical Impression  Pt admitted with above diagnosis. At baseline, pt is independent and lives alone.  With recent admission he required SNF at d/c.  Today, pt ambulating 20' and requiring min-mod A for transfers limited by pain.  Pt did have LSO in room that was donned and somewhat improved pain. Also, requiring frequent cues for safety and precautions.  Pt's AMPAC score indicated ability to return home; however, due to varied and uncontrolled pain, decreased support at home, need for assist/cues for safety/precautions, and not ambulating household distances - recommend return to SNF. Pt currently with functional limitations due to the deficits listed below (see PT Problem List). Pt will benefit from skilled PT to increase their independence and safety with mobility to allow discharge to the venue listed below.    Addendumn 02/23/21: MD reached out to therapist regarding d/c plans.  Pt now reports has daughter that can stay with him and a friend while she is at work and would like to go home.  Considering this - updated d/c recommendations below.         Recommendations for follow up therapy are one component of a multi-disciplinary discharge planning process, led by the attending physician.  Recommendations may be updated based on patient status, additional functional criteria and insurance  authorization.  Follow Up Recommendations Skilled nursing-short term rehab (<3 hours/day); vs home with HHPT/RN and near constant supervision    Assistance Recommended at Discharge Frequent or constant Supervision/Assistance  Functional Status Assessment Patient has had a recent decline in their functional status and demonstrates the ability to make significant improvements in function in a reasonable and predictable amount of time.  Equipment Recommendations  None recommended by PT    Recommendations for Other Services       Precautions / Restrictions Precautions Precautions: Back Required Braces or Orthoses: Spinal Brace Spinal Brace: Lumbar corset;Applied in sitting position Restrictions Other Position/Activity Restrictions: Per prior notes pt with spinal corset brace but he reports he lost at last admission - now has new brace in room      Mobility  Bed Mobility Overal bed mobility: Needs Assistance Bed Mobility: Sidelying to Sit;Sit to Sidelying   Sidelying to sit: Supervision     Sit to sidelying: Mod assist General bed mobility comments: At arrival pt had gotten OOB on his own and standing at the door asking for help.  He got up on his own but likely with difficulty, decreased safety, and not using log roll technique.  For return to bed, max cues for log roll technique and mod A for legs    Transfers Overall transfer level: Needs assistance Equipment used: Rolling walker (2 wheels) Transfers: Sit to/from Stand Sit to Stand: Min guard           General transfer comment: Min guard for safety; performed x 2 with increased time to rise and sit due to pain    Ambulation/Gait Ambulation/Gait assistance:  Min guard Gait Distance (Feet): 20 Feet Assistive device: Rolling walker (2 wheels) Gait Pattern/deviations: Step-to pattern;Decreased stride length;Trunk flexed Gait velocity: decreased   General Gait Details: Cues for posture and RW proximity; min guard safety;  distance limited due to pain  Stairs            Wheelchair Mobility    Modified Rankin (Stroke Patients Only)       Balance Overall balance assessment: Needs assistance Sitting-balance support: Bilateral upper extremity supported Sitting balance-Leahy Scale: Fair Sitting balance - Comments: Using bil UE but more for pain control   Standing balance support: Bilateral upper extremity supported;No upper extremity supported Standing balance-Leahy Scale: Fair Standing balance comment: Did stand without AD but requiring RW to ambulate                             Pertinent Vitals/Pain Pain Assessment: 0-10 Pain Score: 9  Pain Location: Back and radiating to legs Pain Descriptors / Indicators: Throbbing;Aching;Stabbing;Grimacing Pain Intervention(s): Limited activity within patient's tolerance;Monitored during session;Repositioned (some relief when brace added)    Home Living Family/patient expects to be discharged to:: Skilled nursing facility Living Arrangements: Alone Available Help at Discharge: Family;Available PRN/intermittently Type of Home: House Home Access: Stairs to enter Entrance Stairs-Rails: Right Entrance Stairs-Number of Steps: 3-4   Home Layout: One level Home Equipment: Conservation officer, nature (2 wheels)      Prior Function               Mobility Comments: Prior to recent admissions pt was independent without AD.  With recent admissions/osteomyelitis pt has been ambulating with RW, unable to care for self at home , and had gone to SNF ADLs Comments: Independent prior to recent admissions     Hand Dominance        Extremity/Trunk Assessment   Upper Extremity Assessment Upper Extremity Assessment: Overall WFL for tasks assessed    Lower Extremity Assessment Lower Extremity Assessment: LLE deficits/detail;RLE deficits/detail RLE Deficits / Details: ROM WFL: MMT at least 3/5 but unable to further test due to pain LLE Deficits / Details:  ROM WFL: MMT at least 3/5 but unable to further test due to pain    Cervical / Trunk Assessment Cervical / Trunk Assessment: Other exceptions Cervical / Trunk Exceptions: L1 compression fx, osteomyelitis - back precautions  Communication   Communication: No difficulties  Cognition Arousal/Alertness: Awake/alert Behavior During Therapy: WFL for tasks assessed/performed Overall Cognitive Status: History of cognitive impairments - at baseline Area of Impairment: Safety/judgement                         Safety/Judgement: Decreased awareness of safety     General Comments: Decreased awareness of safety (up by self at arriva) and requiring cues for back precautions        General Comments General comments (skin integrity, edema, etc.): VSS on RA.  At arrival pt standing at doorway asking for help.  He reports pain and needing gown fixed.  Assisted with gown and then donned LSO corset brace - pt reports slight improvement with brace.  Brace removed when returned to bed    Exercises     Assessment/Plan    PT Assessment Patient needs continued PT services  PT Problem List Decreased strength;Decreased activity tolerance;Decreased mobility;Decreased balance;Pain;Decreased safety awareness;Decreased knowledge of precautions;Decreased range of motion;Decreased cognition;Decreased knowledge of use of DME       PT Treatment Interventions  Gait training;DME instruction;Stair training;Functional mobility training;Therapeutic activities;Therapeutic exercise;Balance training;Neuromuscular re-education;Patient/family education;Modalities    PT Goals (Current goals can be found in the Care Plan section)  Acute Rehab PT Goals Patient Stated Goal: to reduce pain PT Goal Formulation: With patient Time For Goal Achievement: 03/09/21 Potential to Achieve Goals: Good    Frequency Min 3X/week   Barriers to discharge Decreased caregiver support      Co-evaluation                AM-PAC PT "6 Clicks" Mobility  Outcome Measure Help needed turning from your back to your side while in a flat bed without using bedrails?: A Little Help needed moving from lying on your back to sitting on the side of a flat bed without using bedrails?: A Little Help needed moving to and from a bed to a chair (including a wheelchair)?: A Little Help needed standing up from a chair using your arms (e.g., wheelchair or bedside chair)?: A Little Help needed to walk in hospital room?: A Little Help needed climbing 3-5 steps with a railing? : A Lot 6 Click Score: 17    End of Session Equipment Utilized During Treatment: Gait belt Activity Tolerance: Patient limited by pain Patient left: in bed;with bed alarm set;with call bell/phone within reach Nurse Communication: Mobility status PT Visit Diagnosis: Unsteadiness on feet (R26.81);Muscle weakness (generalized) (M62.81);Difficulty in walking, not elsewhere classified (R26.2);Pain Pain - part of body:  (back)    Time: 1540-1600 PT Time Calculation (min) (ACUTE ONLY): 20 min   Charges:   PT Evaluation $PT Eval Low Complexity: 1 Low          Arless Vineyard, PT Acute Rehab Services Pager [phone] [facility] [phone]   Karlton Lemon 02/23/2021, 4:14 PM

## 2021-02-24 DIAGNOSIS — M869 Osteomyelitis, unspecified: Secondary | ICD-10-CM | POA: Diagnosis not present

## 2021-02-24 LAB — RESP PANEL BY RT-PCR (FLU A&B, COVID) ARPGX2
Influenza A by PCR: NEGATIVE
Influenza B by PCR: NEGATIVE
SARS Coronavirus 2 by RT PCR: NEGATIVE

## 2021-02-24 MED ORDER — VANCOMYCIN IV (FOR PTA / DISCHARGE USE ONLY): 1000.0000 mg | INTRAVENOUS | 0 refills | Status: DC

## 2021-02-24 MED ORDER — CEFTRIAXONE IV (FOR PTA / DISCHARGE USE ONLY): 2.0000 g | INTRAVENOUS | 0 refills | Status: DC

## 2021-02-24 MED ORDER — POTASSIUM CHLORIDE CRYS ER 20 MEQ PO TBCR
40.0000 meq | EXTENDED_RELEASE_TABLET | Freq: Once | ORAL | Status: AC
  Administered 2021-02-24: 40 meq via ORAL
  Filled 2021-02-24: qty 2

## 2021-02-24 NOTE — Progress Notes (Signed)
Family Medicine Teaching Service Daily Progress Note Intern Pager: [phone]  Patient name: Jeffrey [name] Medical record number: [id number] Date of birth: [birth year] Age: 70 y.o. Gender: male  Primary Care Provider: Carollee Leitz, MD Consultants: IV team Code Status: Full  Pt Overview and Major Events to Date:  11/2: Admitted after midnight.  IV team flushed PICC line, and it remained curled, so new PICC line inserted in left arm  Assessment and Plan: [name] is a 70 year old male presenting from SNF with initial complaint of worsening back pain, later revealed to be dissatisfaction of current care at nursing facility.  Patient currently receiving 6 weeks IV antibiotics (vancomycin/CTX) for multilevel osteomyelitis and L1 compression fracture.  PMH of HTN, chronic hyponatremia, alcohol use disorder, chronic hepatitis C, and history of benign neoplasm of colon status-post right colectomy.  Multilevel osteomyelitis of T9-T10, T12-L1 with L1 compression fracture Placement Patient's pain is well controlled, and he was advised that his antibiotic treatment will be completed on 11/19. New PICC line inserted into the left arm 11/2. - Continue pain control regimen: Tylenol 650 q6h PRN for mild-moderate pain, Oxycodone 5mg  q4h PRN for severe pain - Continue antibiotics: Vancomycin 1 g every 24 hours IV and CTX 2 g every 24 hours IV - Continue bowel regimen: MiraLAX 17 g daily - Vancomycin trough level to be obtained weekly  Hypertension 24-hour BP 131-162/86-92 - Continue home losartan 100 mg daily - Continue home Norvasc 10 mg daily  Radiculopathy in bilateral lower extremities Stable, chronic.  No worsening stenosis per MRI - Continue home gabapentin 300 mg 3 times daily  Chronic issues not being managed during this admission Alcohol use disorder: Unlikely EtOH use while in SNF; CIWA protocol discontinued Chronic hepatitis C, genotype 1: Treatment completed in 2017 with no cirrhosis History  of benign neoplasm of colon status post colectomy in 2012: Recommendation of colonoscopy every 2 years, last colonoscopy 2018  FEN/GI: Heart healthy PPx: Lovenox Dispo: Home with [facility] and HH RN  Subjective:  Patient reports doing well this morning, as he denies pain and has been voiding appropriately, resolving his constipation.  He reports that he would like to go home, completing treatment at a short-term skilled nursing facility or home with home health nursing.  He maintains that his daughter is able to help him with his ADLs and medication management.  He denies new somatic complaints today  Objective: Temp:  [97.6 F (36.4 C)-98.5 F (36.9 C)] 97.7 F (36.5 C) (11/03 0427) Pulse Rate:  [83-95] 94 (11/03 0427) Resp:  [16-18] 18 (11/03 0427) BP: (131-168)/(86-99) 147/86 (11/03 0427) SpO2:  [100 %] 100 % (11/03 0427) Weight:  [64.5 kg] 64.5 kg (11/02 0710) Physical Exam: General: Thin man with poor dentition, sitting in chair with no apparent distress.  Back brace in place Cardiovascular: Regular rate and rhythm, normal heart sounds, no murmurs appreciated Respiratory: Normal effort, CTA bilaterally Abdomen: Back brace in place with abdominal binder, unable to assess Neurological: A&O x4 Psych: Normal mood and affect  Laboratory: Recent Labs  Lab 02/21/21 2043 02/22/21 1421 02/23/21 1838  WBC 9.0 6.6 6.5  HGB 9.9* 9.9* 9.5*  HCT 30.8* 31.6* 29.0*  PLT 337 311 286   Recent Labs  Lab 02/21/21 2043 02/22/21 1421 02/23/21 1838  NA 135 137 132*  K 3.5 3.5 3.1*  CL 103 105 100  CO2 22 22 23   BUN 10 10 6*  CREATININE 1.31* 1.09 0.86  CALCIUM 10.6* 10.4* 9.8  PROT 7.8  --   --  BILITOT 0.9  --   --   ALKPHOS 86  --   --   ALT 8  --   --   AST 15  --   --   GLUCOSE 92 82 97    Imaging/Diagnostic Tests: CXR to assess for PICC line position; showed curling of tip, indication for replacement.  Rosezetta Schlatter, MD 02/24/2021, 7:09 AM PGY-1, [place] Intern pager: [phone], text pages welcome

## 2021-02-24 NOTE — Hospital Course (Addendum)
Dima is a 70 year old male presenting from SNF with initial complaint of worsening back pain, later revealed to be dissatisfaction of current care at nursing facility.  Patient currently receiving 6 weeks IV antibiotics (vancomycin/CTX) for multilevel osteomyelitis and L1 compression fracture.  PMH of HTN, chronic hyponatremia, alcohol use disorder, chronic hepatitis C, and history of benign neoplasm of colon status-post right colectomy.  During this admission, patient's PICC line was noted to be curled on CXR, restricting access for antibiotics to be given. The IV team attempted to flush the line. This did not resolve the curling, so the PICC line was removed and a new line was placed in the left arm.  Patient's pain has been well controlled, and he will need SNF placement, as appropriate supervision not available at home.   Patient also had hypokalemia during this admission that resolved with supplementation and an elevated creatinine that resolved with IV fluids and increased po fluid intake.   Issues for PCP F/u  Patient noted to have spinal stenosis on MRI from this admission. Please make sure the patient has follow up with spinal surgery/orthopedics: Follow-up with Dr. Lorin Mercy 11/8 at 1:15 PM.  Ambulatory referral to neurosurgery placed. Hypokalemia: potassium monitored and repleted during stay. Please check BMP at follow up visit to ensure WNL.

## 2021-02-24 NOTE — Evaluation (Signed)
Occupational Therapy Evaluation Patient Details Name: Jeffrey [name] MRN: [id number] DOB: [birth year] Today's Date: 02/24/2021   History of Present Illness 70 yo male presenting from SNF on 01/24/21 with dissatisfaction of facility, concern for his care, and malpositioned PICC.  Pt with recent multilevel osteomylietis T9-10, T 12-L1, and L 1 compression fx.  He was at [facility] for 6 weeks IV antibiotics and therapy.PMH including HTN, chronic hyponatremia, ETOH use disorder, chronic hepatitis C, radiculopathy in BL lower extremities, hx of benign neoplasm of colon s/p right colectomy.   Clinical Impression   PTA, pt was at SNF for rehab and was performing mobility with RW and ADLs and therapy and staff. Prior to SNF admission, pt was independent with ADLs, IADLs, and driving.  Pt currently requiring Min Guard A for LB ADLs and functional mobility; Min A for brace management. Providing education and handout on back precautions, brace management, LB ADLs, and functional transfers. Pt would benefit from further acute OT to facilitate safe dc. Pending support for daughter and son, recommend dc to home once medically stable per physician.     Recommendations for follow up therapy are one component of a multi-disciplinary discharge planning process, led by the attending physician.  Recommendations may be updated based on patient status, additional functional criteria and insurance authorization.   Follow Up Recommendations  No OT follow up [facility] RN and PT) Will need support from family for IADLs and driving.   Assistance Recommended at Discharge Intermittent Supervision/Assistance  Functional Status Assessment  Patient has had a recent decline in their functional status and demonstrates the ability to make significant improvements in function in a reasonable and predictable amount of time.  Equipment Recommendations  BSC    Recommendations for Other Services PT consult     Precautions / Restrictions  Precautions Precautions: Back Precaution Booklet Issued: Yes (comment) Precaution Comments: Pt abel to recall back precautions with Min cues Required Braces or Orthoses: Spinal Brace Spinal Brace: Lumbar corset;Applied in sitting position      Mobility Bed Mobility               General bed mobility comments: In recliner upon arrival    Transfers Overall transfer level: Needs assistance Equipment used: Rolling walker (2 wheels) Transfers: Sit to/from Stand Sit to Stand: Min guard           General transfer comment: MIn GUard A for safety      Balance Overall balance assessment: Needs assistance Sitting-balance support: Bilateral upper extremity supported Sitting balance-Leahy Scale: Fair     Standing balance support: Bilateral upper extremity supported;No upper extremity supported Standing balance-Leahy Scale: Fair                             ADL either performed or assessed with clinical judgement   ADL Overall ADL's : Needs assistance/impaired Eating/Feeding: Set up;Sitting   Grooming: Set up;Sitting   Upper Body Bathing: Supervision/ safety;Set up;Sitting   Lower Body Bathing: Sit to/from stand;Min guard;Cueing for safety;Cueing for sequencing;Cueing for compensatory techniques;Cueing for back precautions   Upper Body Dressing : Minimal assistance;Sitting Upper Body Dressing Details (indicate cue type and reason): Providing education on donning of brace. MIn A for positioning of brace. Lower Body Dressing: Min guard;Sit to/from stand Lower Body Dressing Details (indicate cue type and reason): Able to perform figure four Toilet Transfer: Min guard;Ambulation;Rolling walker (2 wheels)           Functional mobility  during ADLs: Min guard;Rolling walker (2 wheels) General ADL Comments: Providing education on back precautions. Pt performing at [place] level. Decreased awareness of safety but able to follow cues appropiately.      Vision Baseline Vision/History: 1 Wears glasses Patient Visual Report: No change from baseline       Perception     Praxis      Pertinent Vitals/Pain Pain Assessment: 0-10 Pain Score: 3  Pain Location: Back and radiating to legs Pain Descriptors / Indicators: Throbbing;Aching;Stabbing;Grimacing Pain Intervention(s): Monitored during session;Limited activity within patient's tolerance;Repositioned     Hand Dominance     Extremity/Trunk Assessment Upper Extremity Assessment Upper Extremity Assessment: Overall WFL for tasks assessed   Lower Extremity Assessment Lower Extremity Assessment: Defer to PT evaluation   Cervical / Trunk Assessment Cervical / Trunk Assessment: Other exceptions Cervical / Trunk Exceptions: L1 compression fx, osteomyelitis - back precautions   Communication Communication Communication: No difficulties   Cognition Arousal/Alertness: Awake/alert Behavior During Therapy: WFL for tasks assessed/performed Overall Cognitive Status: Impaired/Different from baseline Area of Impairment: Safety/judgement                         Safety/Judgement: Decreased awareness of safety     General Comments: Pt following cues and able to recall BLT with Min cues. Able to discuss dc options and need for increased support at home.     General Comments       Exercises     Shoulder Instructions      Home Living Family/patient expects to be discharged to:: Private residence Living Arrangements: Alone Available Help at Discharge: Family;Available PRN/intermittently Type of Home: House Home Access: Stairs to enter [employer] of Steps: 3-4 Entrance Stairs-Rails: Right Home Layout: One level     Bathroom Shower/Tub: Teacher, early years/pre: Standard     Home Equipment: Conservation officer, nature (2 wheels)          Prior Functioning/Environment Prior Level of Function : History of Falls (last six months);Needs assist        Physical Assist : Mobility (physical);ADLs (physical) Mobility (physical): Gait ADLs (physical): Dressing;Toileting;Bathing Mobility Comments: Working with PT ast SNF for mobility using RW ADLs Comments: Working with therapists to perform dressing at SNF. Prior to fall and dc to SNF, pt was independent with ADLs, IADLs, and driving.        OT Problem List: Decreased strength;Decreased range of motion;Decreased activity tolerance;Impaired balance (sitting and/or standing);Decreased safety awareness;Decreased knowledge of use of DME or AE;Decreased knowledge of precautions;Impaired sensation;Pain      OT Treatment/Interventions: Self-care/ADL training;Therapeutic exercise;DME and/or AE instruction;Therapeutic activities;Patient/family education;Balance training    OT Goals(Current goals can be found in the care plan section) Acute Rehab OT Goals Patient Stated Goal: Go home at dc OT Goal Formulation: With patient Time For Goal Achievement: 03/10/21 Potential to Achieve Goals: Good ADL Goals Pt Will Perform Upper Body Dressing: with modified independence;sitting Pt Will Perform Lower Body Dressing: with modified independence;sit to/from stand Pt Will Transfer to Toilet: with modified independence;ambulating;regular height toilet Pt Will Perform Toileting - Clothing Manipulation and hygiene: with modified independence;sitting/lateral leans;sit to/from stand Pt Will Perform Tub/Shower Transfer: Tub transfer;3 in 1;ambulating;with supervision;rolling walker Additional ADL Goal #1: Pt will independently recall 3/3 back precautions in preparation for ADLs  OT Frequency: Min 2X/week   Barriers to D/C: Other (comment)  Unsure of assistance that children can provide at dc       Co-evaluation  AM-PAC OT "6 Clicks" Daily Activity     Outcome Measure Help from another person eating meals?: None Help from another person taking care of personal grooming?: A Little Help from  another person toileting, which includes using toliet, bedpan, or urinal?: A Little Help from another person bathing (including washing, rinsing, drying)?: A Little Help from another person to put on and taking off regular upper body clothing?: A Little Help from another person to put on and taking off regular lower body clothing?: A Little 6 Click Score: 19   End of Session Equipment Utilized During Treatment: Rolling walker (2 wheels) Nurse Communication: Mobility status  Activity Tolerance: Patient tolerated treatment well Patient left: in chair;with call bell/phone within reach  OT Visit Diagnosis: Unsteadiness on feet (R26.81);Other abnormalities of gait and mobility (R26.89);Muscle weakness (generalized) (M62.81);Pain                Time: 2072-1828 OT Time Calculation (min): 30 min Charges:  OT General Charges $OT Visit: 1 Visit OT Evaluation $OT Eval Moderate Complexity: 1 Mod OT Treatments $Self Care/Home Management : 8-22 mins  Bernie Fobes MSOT, OTR/L Acute Rehab Pager: [phone] Office: [place] 02/24/2021, 12:58 PM

## 2021-02-24 NOTE — Progress Notes (Signed)
PHARMACY CONSULT NOTE FOR:  OUTPATIENT  PARENTERAL ANTIBIOTIC THERAPY (OPAT)  Indication: Osteomyelitis Regimen: Vancomycin 1000 mg every 24 hours + Ceftriaxone 2 gm IV Q 24 hours End date: 03/12/2021  IV antibiotic discharge orders are pended. To discharging provider:  please sign these orders via discharge navigator,  Select New Orders & click on the button choice - Manage This Unsigned Work.     Thank you for allowing pharmacy to be a part of this patient's care.  Jimmy Footman, PharmD, BCPS, [place] [facility] Clinical Pharmacist Phone: [phone] 02/24/2021, 12:27 PM

## 2021-02-24 NOTE — Progress Notes (Signed)
Mobility Specialist Progress Note   02/24/21 1700  Mobility  Activity Ambulated in hall;Ambulated in room  Level of Assistance Contact guard assist, steadying assist  Assistive Device Front wheel walker  Distance Ambulated (ft) 200 ft  Mobility Ambulated with assistance in hallway  Mobility Response Tolerated well  Mobility performed by Mobility specialist  Bed Position Chair  $Mobility charge 1 Mobility   Received pt in bed having no complaints and agreeable to mobility. Asymptomatic throughout ambulation, returned back to chair w/ call bell by side and all needs met.  Holland Falling Mobility Specialist Phone Number [phone]

## 2021-02-24 NOTE — TOC Initial Note (Addendum)
Transition of Care [facility]) - Initial/Assessment Note    Patient Details  Name: Jeffrey [name] MRN: [id number] Date of Birth: 1950/10/27  [facility] CM/SW Contact:    Marilu Favre, RN Phone Number: 02/24/2021, 10:02 AM  Clinical Narrative:                 NCM and SW talked to patient at bedside regarding disposition. Patient does not want to return to SNF, patient prefers to return to home at discharge. Patient does live alone, he has a daughter and girl friend who can assist him at home. While they are at work he has friends who could stay with him.  NCM explained he will have [employer] as an infusion company. [employer] has a Marine scientist ,Pam who will come to patient's hospital room prior to discharge and provide teaching on PICC and IV ABX. Patient will have a [place] however [place] will not be present every time a dose of ABX is due. HHPT usually comes twice a week for 45 minutes.   Patient voiced understanding to above and prefers to go home. He will make some phone calls to family / friends while NCM works on infusion company and home health agency.   NCM discussed referral with Tommi Rumps with [facility] , he will review and make determination. Cory returned call, patient was active with [facility] prior to going to [facility]. They will need home health orders and face to face.    Pam with [facility] will also call NCM back.   Will message MD and PT .   Patient has a walker and cane at home already.    OT recommended 3 in1 , same ordered with Chrys Racer with [place] with [employer] called. She called patient to schedule teaching. Patient stated that he is not going home he is going to SNF. NCM went to to patient room. NCM explained to patient NCM, SW and nurse understood him this morning that he did not want to go to any SNF, he wanted to go home with home health, his girl friend and daughter would assist with IV ABX and he had friends to stay with him. Patient stated  that was not what he "meant" he does want SNF. MD updated.   1625 Patient has 2 SNF bed offers. [facility] and [facility]. NCM went to patient's room with RN. NCM called daughter Jeffrey [name] on speaker phone. Daughter and patient want [facility] . NCM explained to patient and daughter [facility] team will submit for insurance authorization. Once insurance makes determination , will update both. NCM explained insurance does not cover long term care at a SNF. Both voiced understanding.  Expected Discharge Plan: [place]     Patient Goals and CMS Choice Patient states their goals for this hospitalization and ongoing recovery are:: to go home CMS Medicare.gov Compare Post Acute Care list provided to:: Patient Choice offered to / list presented to : Patient  Expected Discharge Plan and Services Expected Discharge Plan: [place]   Discharge Planning Services: CM Consult Post Acute Care Choice: [place] arrangements for the past 2 months: Single Family Home                           HH Arranged: RN, PT          Prior Living Arrangements/Services Living arrangements for the past 2 months: Single Family  Home Lives with:: Self Patient language and need for interpreter reviewed:: Yes        Need for Family Participation in Patient Care: Yes (Comment) Care giver support system in place?: Yes (comment) Current home services: DME Criminal Activity/Legal Involvement Pertinent to Current Situation/Hospitalization: No - Comment as needed  Activities of Daily Living Home Assistive Devices/Equipment: Cane (specify quad or straight) ADL Screening (condition at time of admission) Patient's cognitive ability adequate to safely complete daily activities?: Yes Is the patient deaf or have difficulty hearing?: No Does the patient have difficulty seeing, even when wearing glasses/contacts?: No Does the patient have difficulty concentrating, remembering,  or making decisions?: No Patient able to express need for assistance with ADLs?: Yes (someone to cook for him) Does the patient have difficulty dressing or bathing?: No Independently performs ADLs?: Yes (appropriate for developmental age) Communication: Independent Dressing (OT): Independent Grooming: Independent Feeding: Independent Bathing: Independent Is this a change from baseline?: Pre-admission baseline Toileting: Independent In/Out Bed: Independent Is this a change from baseline?: Pre-admission baseline Walks in Home: Independent Is this a change from baseline?: Pre-admission baseline Does the patient have difficulty walking or climbing stairs?: Yes Weakness of Legs: Both Weakness of Arms/Hands: None  Permission Sought/Granted   Permission granted to share information with : No              Emotional Assessment Appearance:: Appears stated age Attitude/Demeanor/Rapport: Engaged Affect (typically observed): Accepting Orientation: : Oriented to Self, Oriented to Place, Oriented to  Time, Oriented to Situation Alcohol / Substance Use: Not Applicable Psych Involvement: No (comment)  Admission diagnosis:  Osteomyelitis [facility]) [M86.9] Patient Active Problem List   Diagnosis Date Noted   Displacement of peripherally inserted central catheter (PICC) ([place])    Malnutrition of moderate degree 02/02/2021   Uncontrolled pain    Acute bilateral low back pain with bilateral sciatica    Weakness generalized    Constipation    Sepsis ([place]) 01/24/2021   Neuropathic pain of both legs 01/24/2021   Osteomyelitis ([place]) 01/24/2021   Osteomyelitis of thoracic spine ([place]) 01/23/2021   Foot swelling 12/07/2020   Compression fracture of L1 lumbar vertebra ([place]) 12/05/2020   Cellulitis of left lower extremity 12/05/2020   Bilateral low back pain without sciatica 11/26/2020   Prostate cancer ([place]) 10/15/2020   Nocturia 05/14/2020   High risk heterosexual behavior 06/15/2019   Tobacco  use 10/12/2018   Acid reflux 07/25/2018   Rash and nonspecific skin eruption 03/06/2018   Liver fibrosis 02/13/2018   Tubular adenoma 12/26/2016   Left hip pain 11/26/2016   History of partial colectomy 05/18/2016   Erectile dysfunction 05/18/2016   Episodic recurrent vertigo 01/20/2016   Chronic hepatitis C without hepatic coma ([place]) 06/23/2015   Essential hypertension 04/12/2015   Arthritis of knee 04/12/2015   Alcohol abuse 04/12/2015   PCP:  Carollee Leitz, MD Pharmacy:   [facility] [address] [place] [address] Phone: [phone] Fax: [facility] [address] [place] [address] Phone: [phone] Fax: [phone]     Social Determinants of Health (SDOH) Interventions    Readmission Risk Interventions No flowsheet data found.

## 2021-02-24 NOTE — NC FL2 (Signed)
[place] LEVEL OF CARE SCREENING TOOL     IDENTIFICATION  Patient Name: Jeffrey [name] Birthdate: 1950/08/30 Sex: male Admission Date (Current Location): 02/21/2021  [facility] [place] Number:  Herbalist and Address:  [facility] [place], [place] [address] [place], [place] [address]      Provider Number: [id number]  Attending Physician Name and Address:  Kinnie Feil, MD  Relative Name and Phone Number:  Reed,Robin Denman [phone]    Current Level of Care: Hospital Recommended Level of Care: [place] Prior Approval Number:    Date Approved/Denied:   PASRR Number: [id number]  Discharge Plan: SNF    Current Diagnoses: Patient Active Problem List   Diagnosis Date Noted   Displacement of peripherally inserted central catheter (PICC) ([place])    Malnutrition of moderate degree 02/02/2021   Uncontrolled pain    Acute bilateral low back pain with bilateral sciatica    Weakness generalized    Constipation    Sepsis ([place]) 01/24/2021   Neuropathic pain of both legs 01/24/2021   Osteomyelitis ([place]) 01/24/2021   Osteomyelitis of thoracic spine ([place]) 01/23/2021   Foot swelling 12/07/2020   Compression fracture of L1 lumbar vertebra ([place]) 12/05/2020   Cellulitis of left lower extremity 12/05/2020   Bilateral low back pain without sciatica 11/26/2020   Prostate cancer ([place]) 10/15/2020   Nocturia 05/14/2020   High risk heterosexual behavior 06/15/2019   Tobacco use 10/12/2018   Acid reflux 07/25/2018   Rash and nonspecific skin eruption 03/06/2018   Liver fibrosis 02/13/2018   Tubular adenoma 12/26/2016   Left hip pain 11/26/2016   History of partial colectomy 05/18/2016   Erectile dysfunction 05/18/2016   Episodic recurrent vertigo 01/20/2016   Chronic hepatitis C without hepatic coma ([place]) 06/23/2015   Essential hypertension 04/12/2015   Arthritis of knee 04/12/2015   Alcohol abuse 04/12/2015     Orientation RESPIRATION BLADDER Height & Weight     Self, Time, Place  Normal   Weight: 142 lb 3.2 oz (64.5 kg) Height:  5' 7.99" (172.7 cm)  BEHAVIORAL SYMPTOMS/MOOD NEUROLOGICAL BOWEL NUTRITION STATUS      Continent Diet  AMBULATORY STATUS COMMUNICATION OF NEEDS Skin   Supervision Verbally Normal                       Personal Care Assistance Level of Assistance  Bathing, Feeding, Dressing Bathing Assistance: Limited assistance Feeding assistance: Independent Dressing Assistance: Limited assistance     Functional Limitations Info  Sight, Hearing, Speech Sight Info: Adequate Hearing Info: Adequate Speech Info: Adequate    SPECIAL CARE FACTORS FREQUENCY  PT (By licensed PT), OT (By licensed OT)     PT Frequency: 5x a week OT Frequency: 5x a week            Contractures Contractures Info: Not present    Additional Factors Info    Code Status Info: Full Allergies Info: NKA     Isolation Precautions Info: ESBL     Current Medications (02/24/2021):  This is the current hospital active medication list Current Facility-Administered Medications  Medication Dose Route Frequency Provider Last Rate Last Admin   acetaminophen (TYLENOL) tablet 650 mg  650 mg Oral Q6H PRN Dameron, Marisa, DO       amLODipine (NORVASC) tablet 10 mg  10 mg Oral Daily Dameron, Marisa, DO   10 mg at 02/23/21 0950   cefTRIAXone (ROCEPHIN) 2 g in sodium chloride  0.9 % 100 mL IVPB  2 g Intravenous Q24H Zenia Resides, MD 200 mL/hr at 02/23/21 0950 2 g at 02/23/21 0950   Chlorhexidine Gluconate Cloth 2 % PADS 6 each  6 each Topical Daily Zenia Resides, MD   6 each at 02/23/21 2111   enoxaparin (LOVENOX) injection 40 mg  40 mg Subcutaneous Q24H Orvis Brill, DO   40 mg at 02/23/21 6147   feeding supplement (ENSURE ENLIVE / ENSURE PLUS) liquid 237 mL  237 mL Oral BID BM Zenia Resides, MD   237 mL at 02/23/21 0953   gabapentin (NEURONTIN) capsule 300 mg  300 mg Oral TID Orvis Brill, DO   300 mg at 02/23/21 2110   losartan (COZAAR) tablet 100 mg  100 mg Oral Daily Dameron, Luna Fuse, DO   100 mg at 02/23/21 0929   meclizine (ANTIVERT) tablet 25 mg  25 mg Oral Daily PRN Orvis Brill, DO       oxyCODONE (Oxy IR/ROXICODONE) immediate release tablet 5 mg  5 mg Oral Q6H PRN Dameron, Luna Fuse, DO   5 mg at 02/24/21 0833   polyethylene glycol (MIRALAX / GLYCOLAX) packet 17 g  17 g Oral Daily Dameron, Marisa, DO   17 g at 02/23/21 5747   sodium chloride flush (NS) 0.9 % injection 10-40 mL  10-40 mL Intracatheter Q12H Zenia Resides, MD   10 mL at 02/23/21 2110   vancomycin (VANCOCIN) IVPB 1000 mg/200 mL premix  1,000 mg Intravenous Q24H Zenia Resides, MD 200 mL/hr at 02/24/21 0541 1,000 mg at 02/24/21 0541     Discharge Medications: Please see discharge summary for a list of discharge medications.  Relevant Imaging Results:  Relevant Lab Results:   Additional Information SSN:  [id number];  NO COVID VACCINES  Emeterio Reeve, LCSW

## 2021-02-25 DIAGNOSIS — M4624 Osteomyelitis of vertebra, thoracic region: Secondary | ICD-10-CM | POA: Diagnosis not present

## 2021-02-25 DIAGNOSIS — S32010D Wedge compression fracture of first lumbar vertebra, subsequent encounter for fracture with routine healing: Secondary | ICD-10-CM

## 2021-02-25 LAB — BASIC METABOLIC PANEL
Anion gap: 10 (ref 5–15)
Anion gap: 7 (ref 5–15)
BUN: 10 mg/dL (ref 8–23)
BUN: 10 mg/dL (ref 8–23)
CO2: 22 mmol/L (ref 22–32)
CO2: 21 mmol/L — ABNORMAL LOW (ref 22–32)
Calcium: 9.9 mg/dL (ref 8.9–10.3)
Calcium: 9.2 mg/dL (ref 8.9–10.3)
Chloride: 101 mmol/L (ref 98–111)
Chloride: 105 mmol/L (ref 98–111)
Creatinine, Ser: 1.58 mg/dL — ABNORMAL HIGH (ref 0.61–1.24)
Creatinine, Ser: 1.39 mg/dL — ABNORMAL HIGH (ref 0.61–1.24)
GFR, Estimated: 47 mL/min — ABNORMAL LOW (ref >60)
GFR, Estimated: 55 mL/min — ABNORMAL LOW (ref >60)
Glucose, Bld: 88 mg/dL (ref 70–99)
Glucose, Bld: 125 mg/dL — ABNORMAL HIGH (ref 70–99)
Potassium: 3.3 mmol/L — ABNORMAL LOW (ref 3.5–5.1)
Potassium: 3.9 mmol/L (ref 3.5–5.1)
Sodium: 133 mmol/L — ABNORMAL LOW (ref 135–145)
Sodium: 133 mmol/L — ABNORMAL LOW (ref 135–145)

## 2021-02-25 LAB — VANCOMYCIN, TROUGH: Vancomycin Tr: 18 ug/mL (ref 15–20)

## 2021-02-25 MED ORDER — SODIUM CHLORIDE 0.9 % IV BOLUS
500.0000 mL | Freq: Two times a day (BID) | INTRAVENOUS | Status: DC
  Administered 2021-02-25: 500 mL via INTRAVENOUS

## 2021-02-25 MED ORDER — VANCOMYCIN HCL 750 MG/150ML IV SOLN
750.0000 mg | INTRAVENOUS | Status: DC
  Administered 2021-02-26 – 2021-02-28 (×3): 750 mg via INTRAVENOUS
  Filled 2021-02-25 (×3): qty 150

## 2021-02-25 MED ORDER — SODIUM CHLORIDE 0.9 % IV BOLUS: 500.0000 mL | Freq: Two times a day (BID) | INTRAVENOUS | Status: DC

## 2021-02-25 MED ORDER — POTASSIUM CHLORIDE CRYS ER 20 MEQ PO TBCR
40.0000 meq | EXTENDED_RELEASE_TABLET | Freq: Once | ORAL | Status: AC
  Administered 2021-02-25: 40 meq via ORAL
  Filled 2021-02-25: qty 2

## 2021-02-25 MED ORDER — POTASSIUM CHLORIDE CRYS ER 20 MEQ PO TBCR: 40.0000 meq | EXTENDED_RELEASE_TABLET | Freq: Once | ORAL | Status: DC

## 2021-02-25 MED ORDER — SODIUM CHLORIDE 0.9 % IV BOLUS
1000.0000 mL | Freq: Once | INTRAVENOUS | Status: AC
  Administered 2021-02-25: 1000 mL via INTRAVENOUS

## 2021-02-25 NOTE — Progress Notes (Signed)
Family Medicine Teaching Service Daily Progress Note Intern Pager: [phone]  Patient name: Jeffrey [name] Medical record number: [id number] Date of birth: [birth year] Age: 70 y.o. Gender: male  Primary Care Provider: Carollee Leitz, MD Consultants: IV team Code Status: Full  Pt Overview and Major Events to Date:  11/2: Admitted after midnight.  IV team flushed PICC line, and it remained curled, so new PICC line inserted in left arm  Assessment and Plan: Jeffrey [name] is a 70 year old male presenting from SNF with initial complaint of worsening back pain, later revealed to be dissatisfaction of current care at nursing facility.  Patient currently receiving 6 weeks IV antibiotics (vancomycin/CTX) for multilevel osteomyelitis and L1 compression fracture.  PMH of HTN, chronic hyponatremia, alcohol use disorder, chronic hepatitis C, and history of benign neoplasm of colon status-post right colectomy.  Multilevel osteomyelitis of T9-T10, T12-L1 with L1 compression fracture Placement Patient's pain is well controlled, and he was advised that his antibiotic treatment will be completed on 11/19. New PICC line inserted into the left arm 11/2. - Continue pain control regimen: Tylenol 650 q6h PRN for mild-moderate pain, Oxycodone 5mg  q4h PRN for severe pain - Continue antibiotics: Vancomycin 1 g every 24 hours IV and CTX 2 g every 24 hours IV - Continue bowel regimen: MiraLAX 17 g daily - Vancomycin trough 18; to be obtained weekly  Hypertension 24-hour BP 131-138/82-90 - Continue home losartan 100 mg daily - Continue home Norvasc 10 mg daily  Hypokalemia K+ 3.3, increased from 3.1 on admission. -Given Kdur 40 mEq last night -Will reassess on afternoon BMP to determine if additional supplementation indicated  AKI Cr 1.58, increased from 0.86 -Given a 500 mL bolus this AM, and repeat scheduled for 8 PM.  -Will reassess on afternoon BMP  Radiculopathy in bilateral lower extremities Stable, chronic.   No worsening stenosis per MRI - Continue home gabapentin 300 mg 3 times daily - Follow-up scheduled with Dr. Lorin Mercy 11/8 at 1:15 PM  Chronic issues not being managed during this admission Alcohol use disorder: Unlikely EtOH use while in SNF; CIWA protocol discontinued Chronic hepatitis C, genotype 1: Treatment completed in 2017 with no cirrhosis Anemia, stable: Hgb 9.5-9.9; advised to obtain OP colonoscopy History of benign neoplasm of colon status post colectomy in 2012: Recommendation of colonoscopy every 2 years, last colonoscopy 2018   FEN/GI: Heart healthy PPx: Lovenox Dispo: SNF, pending bed placement; COVID negative  Subjective:  Patient reports doing well this morning but reports positional back and sciatic nerve pain, that is controlled with current pain regimen. He is voiding appropriately. He reports that he wants to go to [facility] but voices understanding when advised that the choice in SNF is based on bed availability. He has no further complaints or questions.   Objective: Temp:  [97.4 F (36.3 C)-98.1 F (36.7 C)] 98.1 F (36.7 C) (11/04 0507) Pulse Rate:  [81-99] 81 (11/04 0507) Resp:  [14-17] 17 (11/04 0507) BP: (131-157)/(88-104) 138/90 (11/04 0507) SpO2:  [100 %] 100 % (11/04 0507) Physical Exam: General: Thin man with poor dentition, sitting in bed in no apparent distress.  Cardiovascular: Regular rate and rhythm, normal heart sounds, no murmurs appreciated Respiratory: Normal effort, CTA bilaterally Abdomen: Back brace in place with abdominal binder, unable to assess Neurological: A&O x4 Psych: Normal mood and affect  Laboratory: Recent Labs  Lab 02/21/21 2043 02/22/21 1421 02/23/21 1838  WBC 9.0 6.6 6.5  HGB 9.9* 9.9* 9.5*  HCT 30.8* 31.6* 29.0*  PLT 337 311 286  Recent Labs  Lab 02/21/21 2043 02/22/21 1421 02/23/21 1838 02/25/21 0045  NA 135 137 132* 133*  K 3.5 3.5 3.1* 3.3*  CL 103 105 100 101  CO2 22 22 23 22   BUN 10 10 6* 10   CREATININE 1.31* 1.09 0.86 1.58*  CALCIUM 10.6* 10.4* 9.8 9.9  PROT 7.8  --   --   --   BILITOT 0.9  --   --   --   ALKPHOS 86  --   --   --   ALT 8  --   --   --   AST 15  --   --   --   GLUCOSE 92 82 97 88    Imaging/Diagnostic Tests: None new  Rosezetta Schlatter, MD 02/25/2021, 7:21 AM PGY-1, [place] Intern pager: [phone], text pages welcome

## 2021-02-25 NOTE — Progress Notes (Signed)
Pharmacy Antibiotic Note  Jeffrey [name] is a 70 y.o. male admitted on 02/21/2021 with  osteomyelitis . Patient was admitted 10/2 and diagnosed with osteomyelitis. He was discharged on and discharged on 10/7 on IV ceftriaxone 2g q 24 + IV vancomycin 1000 mg BID for 6 week treatment duration course (03/12/2021). He was re-admitted on 10/10 for severe back pain and discharged to SNF on 10/14 on IV vancomycin 1000 mg daily. Re-admitted again on 10/31, now resolved. Pharmacy has been consulted for vancomycin dosing.  Vanc level today 18. Vanc trough ordered for 11/4 but collected early, more representative of a vancomycin level. Scr increased to 1.58 mg/dL from 0.86 mg/dL yesterday (baseline < 1 mg/dL). Repeat BMP scheduled by medical team for today to re-assess renal function after fluid bolus. Patient remains afebrile and WBC is within normal limits. Plan is for discharge today, 11/4, to SNF.   Given patient's decline in renal function, will plan to decrease vancomycin dose from 1000 mg q24h to 750 mg q 24h. If renal function improves, potentially could increase vancomycin to 1000 mg q 24h at discharge.     Plan: Vancomycin 750 mg IV q 24h Follow-up weekly vanc troughs on discharge Monitor renal function and clinical progress  Height: 5' 7.99" (172.7 cm) Weight: 64.5 kg (142 lb 3.2 oz) IBW/kg (Calculated) : 68.38  Temp (24hrs), Avg:97.7 F (36.5 C), Min:97.4 F (36.3 C), Max:98.1 F (36.7 C)  Recent Labs  Lab 02/21/21 2043 02/22/21 1421 02/23/21 0050 02/23/21 1838 02/25/21 0045  WBC 9.0 6.6  --  6.5  --   CREATININE 1.31* 1.09  --  0.86 1.58*  VANCOTROUGH  --   --  12*  --  18     Estimated Creatinine Clearance: 40.8 mL/min (A) (by C-G formula based on SCr of 1.58 mg/dL (H)).    No Known Allergies  Antimicrobials this admission: CTX 2 g IV q 24h 11/2 >>  Vancomycin 1000 mg IV q 24h 11/2 >>   Dose adjustments this admission: Vanc 1000 mg q 24h decreased to 750 mg q  24h  Thank you for involving pharmacy in this patient's care.  Elita Quick, PharmD PGY1 Ambulatory Care Pharmacy Resident 02/25/2021 12:24 PM  **Pharmacist phone directory can be found on [email or web address] listed under [place]**

## 2021-02-25 NOTE — Progress Notes (Signed)
Physical Therapy Treatment Patient Details Name: Jeffrey [name] MRN: [id number] DOB: 30-Mar-1951 Today's Date: 02/25/2021   History of Present Illness Pt is 70 yo male presenting from SNF on 01/24/21 with dissatisfaction of facility, concern for his care, and malpositioned PICC.  Pt with recent multilevel osteomylietis T9-10, T 12-L1, and L 1 compression fx.  He was at [facility] for 6 weeks IV antibiotics and therapy. Pt with medical hx including HTN, chronic hyponatremia, ETOH use disorder, chronic hepatitis C, radiculopathy in BL lower extremities, hx of benign neoplasm of colon s/p right colectomy.    PT Comments    Pt demos improved tolerance to functional mobility, able to log roll with min A today and stand with mod I and use of RW.  Pt also demos improved endurance, amb inc distance in hall with CGA only.  Pt requests to use bathroom at end of tx in order to have a BM and PT is unable to discuss home vs. SNF with patient directly.  However, based on CM notes - pt is requesting SNF at D/C.  Functional mobility and AMPAC score indicate that pt could safely manage at home with with [facility] PT with primary assistance needed to remind pt of lumbar precautions with mobility.  PT to continue to follow acutely to reinforce precautions.  Recommendations for follow up therapy are one component of a multi-disciplinary discharge planning process, led by the attending physician.  Recommendations may be updated based on patient status, additional functional criteria and insurance authorization.  Follow Up Recommendations  Other (comment) (CM notes indicates pt. would like to go to SNF, however, from a functional standpoint would be safe to return home with [facility] PT.)     Assistance Recommended at Discharge Set up [place] Recommendations  Rolling walker (2 wheels)    Recommendations for Other Services       Precautions / Restrictions Precautions Precautions: Back Precaution Booklet  Issued: Yes (comment) Required Braces or Orthoses: Spinal Brace Spinal Brace: Lumbar corset;Applied in sitting position     Mobility  Bed Mobility Overal bed mobility: Needs Assistance Bed Mobility: Sidelying to Sit;Rolling Rolling: Min assist Sidelying to sit: Min assist       General bed mobility comments: Pt. requires min A for proper log rolling technique.  Stays forward flexed in sitting and needs cues to dec trunk flexion for precautions.  PT assists with donning lumbar brace in sitting. Patient Response: Cooperative  Transfers Overall transfer level: Modified independent Equipment used: Rolling walker (2 wheels) Transfers: Sit to/from Stand Sit to Stand: Supervision           General transfer comment: Performs sit > stand with mod independence.  Takes 2 attempts to complete.  Requires VCs for safety with hand placement.    Ambulation/Gait Ambulation/Gait assistance: Min guard Gait Distance (Feet): 240 Feet Assistive device: Rolling walker (2 wheels) Gait Pattern/deviations: Step-through pattern;Trunk flexed     General Gait Details: Pt. amb in halls with CGA only.  Demos initial trunk flex, but improves upright posture with activity.  No LOB with gait.  Requests to use bathroom on return to room.  Per NSG, pt. is okay to leave up in bathroom - amb BTB with mod I.   Stairs             Wheelchair Mobility    Modified Rankin (Stroke Patients Only)       Balance Overall balance assessment: Modified Independent  Cognition Arousal/Alertness: Awake/alert Behavior During Therapy: WFL for tasks assessed/performed Overall Cognitive Status: Within Functional Limits for tasks assessed                                 General Comments: Pt. is supine in bed when PT arrives. Agreeable to work with PT despite c/o pain.  Wants to amb in halls.        Exercises      General Comments         Pertinent Vitals/Pain Pain Assessment: 0-10 Pain Score: 6  Pain Location: Reports 6/10 back pain, states it's worse when initially moving and then gets better with movement Pain Descriptors / Indicators: Aching;Guarding;Throbbing;Discomfort Pain Intervention(s): Patient requesting pain meds-RN notified    Home Living                          Prior Function            PT Goals (current goals can now be found in the care plan section) Progress towards PT goals: Progressing toward goals    Frequency           PT Plan Current plan remains appropriate    Co-evaluation              AM-PAC PT "6 Clicks" Mobility   Outcome Measure  Help needed turning from your back to your side while in a flat bed without using bedrails?: A Little Help needed moving from lying on your back to sitting on the side of a flat bed without using bedrails?: A Little Help needed moving to and from a bed to a chair (including a wheelchair)?: A Little Help needed standing up from a chair using your arms (e.g., wheelchair or bedside chair)?: A Little Help needed to walk in hospital room?: A Little Help needed climbing 3-5 steps with a railing? : A Little 6 Click Score: 18    End of Session   Activity Tolerance: Patient tolerated treatment well;Patient limited by pain Patient left: with nursing/sitter in room;Other (comment) (In bathroom with NSG in room.) Nurse Communication: Mobility status       Time: 1101-1130 PT Time Calculation (min) (ACUTE ONLY): 29 min  Charges:  $Gait Training: 8-22 mins $Therapeutic Activity: 8-22 mins                     Jeffrey [name], PT, DPT Acute Rehabilitation Services Office: [place] 02/25/2021, 11:34 AM

## 2021-02-25 NOTE — Progress Notes (Signed)
PHARMACY CONSULT NOTE FOR:  OUTPATIENT  PARENTERAL ANTIBIOTIC THERAPY (OPAT)  Indication: Osteomyelitis Regimen: Vancomycin 750 mg every 24 hours + Ceftriaxone 2 gm IV Q 24 hours End date: 03/12/2021  IV antibiotic discharge orders are pended. To discharging provider:  please sign these orders via discharge navigator,  Select New Orders & click on the button choice - Manage This Unsigned Work.     Thank you for allowing pharmacy to be a part of this patient's care.  Jimmy Footman, PharmD, BCPS, [place] [facility] Clinical Pharmacist Phone: [phone] 02/25/2021, 12:32 PM

## 2021-02-25 NOTE — Progress Notes (Signed)
FPTS Brief Note Reviewed patient's vitals, recent notes.  Vitals:   02/25/21 1847 02/25/21 2018  BP: (!) 165/94 (!) 144/71  Pulse: 85 84  Resp: 18 16  Temp: 98.2 F (36.8 C) 98.3 F (36.8 C)  SpO2: 100% 100%   At this time, no change in plan from day progress note.  Orvis Brill, DO Page [phone] with questions about this patient.

## 2021-02-25 NOTE — TOC Progression Note (Signed)
Transition of Care [facility]) - Progression Note    Patient Details  Name: Jeffrey [name] MRN: [id number] Date of Birth: 04/16/1951  Transition of Care [facility]) CM/SW Contact  Emeterio Reeve, [place] Phone Number: 02/25/2021, 3:48 PM  Clinical Narrative:     CSW spoke with pt at bedside. Pt decided to go to return to [facility] before going home. Pt preferred not to return to [facility]. CSW explained to pt that [facility] is his only bed offer, [facility] was not able to accept pt.   CSW reached out to [facility] they can accept pt back on Sunday. CSW started insurance auth. Navi portal was down so CSW started insurance auth ver the phone, reference number [id number]. CSW hard faxed clinicals.   Expected Discharge Plan: [place]    Expected Discharge Plan and Services Expected Discharge Plan: [place]   Discharge Planning Services: CM Consult Post Acute Care Choice: [place] arrangements for the past 2 months: Single Family Home                           HH Arranged: RN, PT           Social Determinants of Health (SDOH) Interventions    Readmission Risk Interventions No flowsheet data found.  Emeterio Reeve, LCSW Clinical Social Worker

## 2021-02-26 DIAGNOSIS — M869 Osteomyelitis, unspecified: Secondary | ICD-10-CM | POA: Diagnosis not present

## 2021-02-26 DIAGNOSIS — M4800 Spinal stenosis, site unspecified: Secondary | ICD-10-CM

## 2021-02-26 LAB — BASIC METABOLIC PANEL
Anion gap: 6 (ref 5–15)
Anion gap: 7 (ref 5–15)
BUN: 7 mg/dL — ABNORMAL LOW (ref 8–23)
BUN: 8 mg/dL (ref 8–23)
CO2: 22 mmol/L (ref 22–32)
CO2: 23 mmol/L (ref 22–32)
Calcium: 9.9 mg/dL (ref 8.9–10.3)
Calcium: 9.8 mg/dL (ref 8.9–10.3)
Chloride: 106 mmol/L (ref 98–111)
Chloride: 104 mmol/L (ref 98–111)
Creatinine, Ser: 1.25 mg/dL — ABNORMAL HIGH (ref 0.61–1.24)
Creatinine, Ser: 1.18 mg/dL (ref 0.61–1.24)
GFR, Estimated: >60 mL/min (ref >60)
GFR, Estimated: >60 mL/min (ref >60)
Glucose, Bld: 85 mg/dL (ref 70–99)
Glucose, Bld: 115 mg/dL — ABNORMAL HIGH (ref 70–99)
Potassium: 3.4 mmol/L — ABNORMAL LOW (ref 3.5–5.1)
Potassium: 3.9 mmol/L (ref 3.5–5.1)
Sodium: 134 mmol/L — ABNORMAL LOW (ref 135–145)
Sodium: 134 mmol/L — ABNORMAL LOW (ref 135–145)

## 2021-02-26 LAB — CBC
HCT: 27.7 % — ABNORMAL LOW (ref 39.0–52.0)
Hemoglobin: 9.2 g/dL — ABNORMAL LOW (ref 13.0–17.0)
MCH: 30.8 pg (ref 26.0–34.0)
MCHC: 33.2 g/dL (ref 30.0–36.0)
MCV: 92.6 fL (ref 80.0–100.0)
Platelets: 248 10*3/uL (ref 150–400)
RBC: 2.99 MIL/uL — ABNORMAL LOW (ref 4.22–5.81)
RDW: 14.1 % (ref 11.5–15.5)
WBC: 5.5 10*3/uL (ref 4.0–10.5)
nRBC: 0 % (ref 0.0–0.2)

## 2021-02-26 MED ORDER — ALTEPLASE 2 MG IJ SOLR
2.0000 mg | Freq: Once | INTRAMUSCULAR | Status: AC
  Administered 2021-02-26: 2 mg

## 2021-02-26 MED ORDER — POTASSIUM CHLORIDE CRYS ER 20 MEQ PO TBCR
40.0000 meq | EXTENDED_RELEASE_TABLET | Freq: Once | ORAL | Status: AC
  Administered 2021-02-26: 40 meq via ORAL
  Filled 2021-02-26: qty 2

## 2021-02-26 NOTE — Progress Notes (Addendum)
Family Medicine Teaching Service Daily Progress Note Intern Pager: [phone]  Patient name: Jeffrey [name] Medical record number: [id number] Date of birth: November 29, 1950 Age: 70 y.o. Gender: male  Primary Care Provider: Carollee Leitz, MD Consultants: IV team Code Status: [name]  Pt Overview and Major Events to Date:  11/2: Admitted. New PICC line placed in left arm  Assessment and Plan: Jeffrey [name] is a 70 year old male presenting with dissatisfaction from SNF, on 6 weeks IV antibiotics (end date: 11/19) for multilevel osteomyelitis. PMH significant for HTN, chornic hyponatremia, alcohol use disorder, chronic hepatitis C, and history of benign neoplasm of colon s/p R colectomy.  Multilevel OM of T9-T10, T12-L1, w/ L1 compression fracture Pain well-controlled on regimen. Received all PRN doses Oxycodone and Tylenol overnight. - Continue current pain regimen: Tylenol 650 mg q6h PRN for mild/moderate pain, Oxycodone 5mg  q6h PRN for severe pain - Continue IV Vancomycin 1g q24h, IV CTX 124h for OM - Continue bowel regimen: Miralax 17g daily - Weekly Vanc trough  Placement issue SW has bed offer at SNF patient came in from [facility] Pt does not want to go back there, and PT has cleared patient for [facility] PT which is another option. - Follow SW - Anticipate d/c tomorrow, 11/6 to either SNF or home with HHPT  HTN Stable. BP 130s/70s-80s overnight - Continue home meds  Hypokalemia K+ 3.4 this morning. Will replete with 5mEq today. - Reassess with afternoon BMP  Radiculopathy in BL LE Stable, Chronic. - Continue home gabapentin - Follow up scheduled with Dr Lorin Mercy 11/8 at 1:15pm  FEN/GI: Heart healthy PPx: Lovenox Dispo:SNF vs. Home with [place] PT, tomorrow 11/6  Subjective:  Patient reports "hamstring pain" this morning, unchanged from prior. Back pain is controlled on current regimen.He does not want to go back to SNF and would rather go home. No further questions or  complaints.  Objective: Temp:  [97.8 F (36.6 C)-98.4 F (36.9 C)] 98.4 F (36.9 C) (11/05 0520) Pulse Rate:  [81-85] 85 (11/05 0520) Resp:  [15-18] 15 (11/05 0520) BP: (131-165)/(71-96) 138/96 (11/05 0520) SpO2:  [100 %] 100 % (11/05 0520) Physical Exam: General: Elderly male laying in bed with home clothes on,  Cardiovascular: RRR Respiratory: CTAB, normal work of breathing on room air Abdomen: Soft, non-distended Extremities: Well-perfused  Laboratory: Recent Labs  Lab 02/22/21 1421 02/23/21 1838 02/26/21 0400  WBC 6.6 6.5 5.5  HGB 9.9* 9.5* 9.2*  HCT 31.6* 29.0* 27.7*  PLT 311 286 248   Recent Labs  Lab 02/21/21 2043 02/22/21 1421 02/25/21 0045 02/25/21 1607 02/26/21 0400  NA 135   < > 133* 133* 134*  K 3.5   < > 3.3* 3.9 3.4*  CL 103   < > 101 105 106  CO2 22   < > 22 21* 22  BUN 10   < > 10 10 7*  CREATININE 1.31*   < > 1.58* 1.39* 1.25*  CALCIUM 10.6*   < > 9.9 9.2 9.9  PROT 7.8  --   --   --   --   BILITOT 0.9  --   --   --   --   ALKPHOS 86  --   --   --   --   ALT 8  --   --   --   --   AST 15  --   --   --   --   GLUCOSE 92   < > 88 125* 85   < > = values  in this interval not displayed.    Imaging/Diagnostic Tests: No results found.   Orvis Brill, DO 02/26/2021, 6:51 AM PGY-1, [place] Intern pager: [phone], text pages welcome

## 2021-02-27 ENCOUNTER — Encounter (HOSPITAL_COMMUNITY): Payer: Self-pay

## 2021-02-27 DIAGNOSIS — M4624 Osteomyelitis of vertebra, thoracic region: Secondary | ICD-10-CM | POA: Diagnosis not present

## 2021-02-27 NOTE — Progress Notes (Signed)
Family Medicine Teaching Service Daily Progress Note Intern Pager: [phone]  Patient name: Jeffrey [name] Medical record number: [id number] Date of birth: 09-26-1950 Age: 70 y.o. Gender: male  Primary Care Provider: Carollee Leitz, MD Consultants: IV team Code Status: Full  Pt Overview and Major Events to Date:  11/2: Admitted. New PICC line placed in left arm  Assessment and Plan: Patient is a 70 year old male initially presenting with worsening pain, later found to be dissatisfaction with SNF, on 6 weeks of IV antibiotics for multilevel osteomyelitis.  PMHx significant for HTN, chronic hyponatremia, alcohol use disorder, chronic hepatitis C, and history of benign neoplasm of colon s/p right colectomy.  Osteomyelitis of T9-T10, T12-L1, with L1 compression fracture Pain well controlled on current regimen.  Patient given 2 doses of as needed oxycodone 5 mg, and 3 doses of as needed Tylenol 650 mg over the past 24 hours. - Continue current pain regimen: Tylenol 650 mg every 6 hours as needed mild/moderate pain and oxycodone 5 mg every 6 hours as needed severe pain - Continue IV vancomycin 1 g every 24 hours and IV CTX 2 g q 24 hours for osteomyelitis - Continue bowel regimen of MiraLAX 17 g daily - Weekly vancomycin trough  Placement issue Patient has bed offer at [facility] SNF (from where he came).  Patient understands that this is his only option, as HH PT is not a feasible option. - Social work to follow up on [employer] stream[employer].  Hypokalemia, resolved K+ 3.9 this a.m.  AKI, resolved CR 1.18 this a.m. - Continue to encourage p.o. fluid intake  HTN Stable; BP 157-164/92-95; most recent 159/92 - Continue home Cozaar 100 mg daily and Norvasc 10 mg daily  BLE radiculopathy Stable, chronic - Continue home gabapentin 300 mg 3 times daily - Follow-up scheduled with Dr. Lorin Mercy on 11/8 at 1:15 PM.  FEN/GI: Heart healthy PPx: Lovenox Dispo: SNF, pending insurance authorization  Subjective:   Patient reports back pain this morning, as he has not yet had his pain medications.  He understands that his only option is to return to [facility], although it is not favorable to him.  He requests to speak with SW to discuss his POA.  No further questions or complaints at this time.  Objective: Temp:  [98.2 F (36.8 C)-98.5 F (36.9 C)] 98.3 F (36.8 C) (11/06 0733) Pulse Rate:  [85-95] 85 (11/06 0733) Resp:  [18] 18 (11/06 0733) BP: (135-164)/(92-96) 159/92 (11/06 0733) SpO2:  [100 %] 100 % (11/06 0733) Physical Exam: General: Elderly male, appearing older than stated age lying in bed with home clothes on. HEENT: Poor dentition Cardiovascular: Regular rate and rhythm, normal heart sounds, no murmurs appreciated Respiratory: CTAB, normal pulmonary effort on room air Abdomen: Soft, nontender, nondistended Neurological: Alert and oriented x4  Laboratory: Recent Labs  Lab 02/22/21 1421 02/23/21 1838 02/26/21 0400  WBC 6.6 6.5 5.5  HGB 9.9* 9.5* 9.2*  HCT 31.6* 29.0* 27.7*  PLT 311 286 248   Recent Labs  Lab 02/21/21 2043 02/22/21 1421 02/25/21 1607 02/26/21 0400 02/26/21 1650  NA 135   < > 133* 134* 134*  K 3.5   < > 3.9 3.4* 3.9  CL 103   < > 105 106 104  CO2 22   < > 21* 22 23  BUN 10   < > 10 7* 8  CREATININE 1.31*   < > 1.39* 1.25* 1.18  CALCIUM 10.6*   < > 9.2 9.9 9.8  PROT 7.8  --   --   --   --  BILITOT 0.9  --   --   --   --   ALKPHOS 86  --   --   --   --   ALT 8  --   --   --   --   AST 15  --   --   --   --   GLUCOSE 92   < > 125* 85 115*   < > = values in this interval not displayed.    Imaging/Diagnostic Tests: None new  Rosezetta Schlatter, MD 02/27/2021, 11:40 AM PGY-1, [place] Intern pager: [phone], text pages welcome

## 2021-02-27 NOTE — Progress Notes (Signed)
Rn contacted chaplain; pt requesting information on AD.  Chaplain provided materials and gave basic information.  Chaplain explained that pt may contact our office during office hours if he would like help filling out the forms.  Chaplain also explained that materials can be completed outside the hospital by a notary public.  Pt expressed appreciation and stated he would contact our office if/when he is ready.   Please contact our office for follow up support.    Luana Shu [phone]     02/27/21 1700  Clinical Encounter Type  Visited With Patient  Visit Type Initial (Requested AD info)  Referral From Nurse  Consult/Referral To Chaplain  Stress Factors  Patient Stress Factors Health changes  Advance Directives (For Healthcare)  Does Patient Have a Medical Advance Directive? No  Would patient like information on creating a medical advance directive? Yes (Inpatient - patient requests chaplain consult to create a medical advance directive)

## 2021-02-27 NOTE — TOC Progression Note (Addendum)
Transition of Care [facility]) - Progression Note    Patient Details  Name: Jeffrey [name] MRN: [id number] Date of Birth: 07-27-1950  Transition of Care [facility]) CM/SW [place], [place] Phone Number: 02/27/2021, 11:31 AM  Clinical Narrative:     Patients insurance is currently pending. Patients insurance confirmed they did received clinicals for review. Patient has SN[facility] bed at [facility]. CSW received consult for POA for patient. CSW paged chaplain. Carmell Austria returned phone call and confirmed she will bring by materials for patient to review and Chaplain tomorrow will follow up to assist patient with HCPOA.MD informed.CSW will continue to follow and assist with patients dc planning needs.  Expected Discharge Plan: [place]    Expected Discharge Plan and Services Expected Discharge Plan: [place]   Discharge Planning Services: CM Consult Post Acute Care Choice: [place] arrangements for the past 2 months: Single Family Home                           HH Arranged: RN, PT           Social Determinants of Health (SDOH) Interventions    Readmission Risk Interventions No flowsheet data found.

## 2021-02-28 ENCOUNTER — Inpatient Hospital Stay: Payer: Medicare Other | Admitting: Internal Medicine

## 2021-02-28 DIAGNOSIS — M869 Osteomyelitis, unspecified: Secondary | ICD-10-CM | POA: Diagnosis not present

## 2021-02-28 LAB — RESP PANEL BY RT-PCR (FLU A&B, COVID) ARPGX2
Influenza A by PCR: NEGATIVE
Influenza B by PCR: NEGATIVE
SARS Coronavirus 2 by RT PCR: NEGATIVE

## 2021-02-28 MED ORDER — CEFTRIAXONE IV (FOR PTA / DISCHARGE USE ONLY): 2.0000 g | INTRAVENOUS | 0 refills | Status: AC

## 2021-02-28 MED ORDER — OXYCODONE HCL 5 MG PO TABS: 5.0000 mg | ORAL_TABLET | Freq: Four times a day (QID) | ORAL | 0 refills | Status: AC | PRN

## 2021-02-28 MED ORDER — AMLODIPINE BESYLATE 10 MG PO TABS: 10.0000 mg | ORAL_TABLET | Freq: Every day | ORAL | 0 refills | Status: DC

## 2021-02-28 MED ORDER — ENOXAPARIN SODIUM 40 MG/0.4ML IJ SOSY: 40.0000 mg | PREFILLED_SYRINGE | INTRAMUSCULAR | 0 refills | Status: DC

## 2021-02-28 MED ORDER — VANCOMYCIN IV (FOR PTA / DISCHARGE USE ONLY): 750.0000 mg | INTRAVENOUS | 0 refills | Status: AC

## 2021-02-28 MED ORDER — LOSARTAN POTASSIUM 100 MG PO TABS: 100.0000 mg | ORAL_TABLET | Freq: Every day | ORAL | 0 refills | Status: DC

## 2021-02-28 MED ORDER — ENSURE ENLIVE PO LIQD: 237.0000 mL | Freq: Two times a day (BID) | ORAL | 12 refills | Status: AC

## 2021-02-28 MED ORDER — HEPARIN SOD (PORK) LOCK FLUSH 100 UNIT/ML IV SOLN
250.0000 [IU] | INTRAVENOUS | Status: AC | PRN
  Administered 2021-02-28: 250 [IU]
  Filled 2021-02-28: qty 2.5

## 2021-02-28 MED ORDER — SODIUM CHLORIDE 0.9% FLUSH: 10.0000 mL | Freq: Two times a day (BID) | INTRAVENOUS | Status: DC

## 2021-02-28 MED ORDER — ACETAMINOPHEN 325 MG PO TABS: 650.0000 mg | ORAL_TABLET | Freq: Four times a day (QID) | ORAL | 0 refills | Status: AC | PRN

## 2021-02-28 MED ORDER — BACLOFEN 10 MG PO TABS: 10.0000 mg | ORAL_TABLET | Freq: Three times a day (TID) | ORAL | 0 refills | Status: DC | PRN

## 2021-02-28 NOTE — Progress Notes (Signed)
FPTS Brief Note Reviewed patient's vitals, recent notes.  Vitals:   02/27/21 1600 02/27/21 2010  BP: (!) 141/98 (!) 162/95  Pulse: 98 89  Resp: 20 18  Temp: 98.3 F (36.8 C) 97.9 F (36.6 C)  SpO2: 100% 100%   At this time, no change in plan from day progress note.  Ezequiel Essex, MD Page [phone] with questions about this patient.

## 2021-02-28 NOTE — Discharge Summary (Signed)
[facility] Discharge Summary  Patient name: Jeffrey [name] Medical record number: [id number] Date of birth: [birth year] Age: 70 y.o. Gender: male Date of Admission: 02/21/2021  Date of Discharge: 02/28/2021 Admitting Physician: Jeffrey Resides, MD  Primary Care Provider: Carollee Leitz, MD Consultants: IV Team  Indication for Hospitalization: Initially presenting with worsening pain, later found to be dissatisfaction with SNF.  Discharge Diagnoses/Problem List:  Osteomyelitis of T9-T10, T12-L1, with L1 compression fracture Placement issue HTN BLE Radiculopathy  Hospital problems resolved: Hypokalemia AKI  Disposition: SNF, [facility]  Discharge Condition: Stable  Discharge Exam:  General: Elderly male, appearing older than stated age lying in bed with home clothes on HEENT: Poor dentition Cardiovascular: Regular rate and rhythm, normal heart sounds, no murmurs appreciated Respiratory: CTA x2, normal pulmonary effort on room air Abdomen: Soft, nontender, nondistended Neurological: Alert and oriented x4 Psychiatric: Normal mood and affect  Brief Hospital Course:  [name] is a 70 year old male presenting from SNF with initial complaint of worsening back pain, later revealed to be dissatisfaction of current care at nursing facility.  Patient currently receiving 6 weeks IV antibiotics (vancomycin/CTX) for multilevel osteomyelitis and L1 compression fracture.  PMH of HTN, chronic hyponatremia, alcohol use disorder, chronic hepatitis C, and history of benign neoplasm of colon status-post right colectomy.  During this admission, patient's PICC line was noted to be curled on CXR, restricting access for antibiotics to be given. The IV team attempted to flush the line. This did not resolve the curling, so the PICC line was removed and a new line was placed in the left arm.  Patient's pain has been well controlled, and he will need SNF placement, as appropriate  supervision not available at home.   Patient also had hypokalemia during this admission that resolved with supplementation and an elevated creatinine that resolved with IV fluids and increased po fluid intake.   Issues for PCP F/u  Patient noted to have spinal stenosis on MRI from this admission. Please make sure the patient has follow up with spinal surgery/orthopedics: Follow-up with Dr. Lorin [name] 11/8 at 1:15 PM.  Ambulatory referral to neurosurgery placed. Hypokalemia: potassium monitored and repleted during stay. Please check BMP at follow up visit to ensure WNL.    Significant Procedures: New PICC line placed in L arm   Significant Labs and Imaging:  Recent Labs  Lab 02/22/21 1421 02/23/21 1838 02/26/21 0400  WBC 6.6 6.5 5.5  HGB 9.9* 9.5* 9.2*  HCT 31.6* 29.0* 27.7*  PLT 311 286 248   Recent Labs  Lab 02/21/21 2043 02/22/21 1421 02/23/21 1838 02/25/21 0045 02/25/21 1607 02/26/21 0400 02/26/21 1650  NA 135   < > 132* 133* 133* 134* 134*  K 3.5   < > 3.1* 3.3* 3.9 3.4* 3.9  CL 103   < > 100 101 105 106 104  CO2 22   < > 23 22 21* 22 23  GLUCOSE 92   < > 97 88 125* 85 115*  BUN 10   < > 6* 10 10 7* 8  CREATININE 1.31*   < > 0.86 1.58* 1.39* 1.25* 1.18  CALCIUM 10.6*   < > 9.8 9.9 9.2 9.9 9.8  ALKPHOS 86  --   --   --   --   --   --   AST 15  --   --   --   --   --   --   ALT 8  --   --   --   --   --   --  ALBUMIN 3.8  --   --   --   --   --   --    < > = values in this interval not displayed.   Vancomycin trough 18  Results/Tests Pending at Time of Discharge: None  Discharge Medications:  Allergies as of 02/28/2021   No Known Allergies      Medication List     STOP taking these medications    morphine 15 MG 12 hr tablet Commonly known as: MS CONTIN       TAKE these medications    acetaminophen 325 MG tablet Commonly known as: TYLENOL Take 2 tablets (650 mg total) by mouth every 6 (six) hours as needed for up to 3 days for moderate pain or mild  pain. What changed:  medication strength how much to take reasons to take this   amLODipine 10 MG tablet Commonly known as: NORVASC Take 1 tablet (10 mg total) by mouth daily. Start taking on: March 01, 2021 What changed:  how much to take when to take this   baclofen 10 MG tablet Commonly known as: LIORESAL Take 10 mg by mouth 3 (three) times daily.   buPROPion 75 MG tablet Commonly known as: WELLBUTRIN Take 75 mg by mouth daily.   calcitonin (salmon) 200 UNIT/ACT nasal spray Commonly known as: MIACALCIN/FORTICAL Place 1 spray into alternate nostrils See admin instructions. Qd x 12 days   cefTRIAXone  IVPB Commonly known as: ROCEPHIN Inject 2 g into the vein daily for 12 days. Indication:  vertebral ostemomyelitis/discitis First Dose: Yes Last Day of Therapy:  03/12/21 Labs - Once weekly:  CBC/D and BMP, Labs - Every other week:  ESR and CRP Method of administration: IV Push Method of administration may be changed at the discretion of home infusion pharmacist based upon assessment of the patient and/or caregiver's ability to self-administer the medication ordered. What changed: additional instructions   cholecalciferol 25 MCG (1000 UNIT) tablet Commonly known as: VITAMIN D Take 1,000 Units by mouth daily.   enoxaparin 40 MG/0.4ML injection Commonly known as: LOVENOX Inject 0.4 mLs (40 mg total) into the skin daily. Start taking on: March 01, 2021   feeding supplement Liqd Take 237 mLs by mouth 2 (two) times daily between meals.   gabapentin 300 MG capsule Commonly known as: NEURONTIN Take 1 capsule (300 mg total) by mouth 3 (three) times daily.   losartan 100 MG tablet Commonly known as: COZAAR Take 1 tablet (100 mg total) by mouth daily. Start taking on: March 01, 2021 What changed:  how much to take when to take this   meclizine 25 MG tablet Commonly known as: ANTIVERT Take 1 tablet (25 mg total) by mouth daily as needed. What changed: reasons  to take this   oxyCODONE 5 MG immediate release tablet Commonly known as: Oxy IR/ROXICODONE Take 1 tablet (5 mg total) by mouth every 6 (six) hours as needed for up to 3 days for severe pain. What changed:  medication strength how much to take when to take this reasons to take this additional instructions Another medication with the same name was removed. Continue taking this medication, and follow the directions you see here.   polyethylene glycol powder 17 GM/SCOOP powder Commonly known as: GLYCOLAX/MIRALAX Take 17 g by mouth daily.   sodium chloride flush 0.9 % Soln Commonly known as: NS 10-40 mLs by Intracatheter route every 12 (twelve) hours.   vancomycin  IVPB Inject 750 mg into the vein daily for 12 days.  Indication:  vertebral osteomyelitis/discitis First Dose: Yes Last Day of Therapy:  03/12/21 Labs - $Remo"Sunday/Monday:  CBC/D, BMP, and vancomycin trough. Labs - Thursday:  BMP and vancomycin trough Labs - Every other week:  ESR and CRP Method of administration:Elastomeric Method of administration may be changed at the discretion of the patient and/or caregiver's ability to self-administer the medication ordered. What changed:  how much to take additional instructions               Durable Medical Equipment  (From admission, onward)           Start     Ordered   02/24/21 1056  For home use only DME 3 n 1  Once        11"aQVis$ /03/22 1055            Discharge Instructions: Please refer to Patient Instructions section of EMR for full details.  Patient was counseled important signs and symptoms that should prompt return to medical care, changes in medications, dietary instructions, activity restrictions, and follow up appointments.   Follow-Up Appointments:   Rosezetta Schlatter, MD 02/28/2021, 1:53 PM PGY-1, [place]

## 2021-02-28 NOTE — Progress Notes (Signed)
Report given to [place] at [facility] . Patient was transported via [place].

## 2021-02-28 NOTE — Progress Notes (Signed)
Physical Therapy Treatment Patient Details Name: Jeffrey [name] MRN: [id number] DOB: 16-Feb-1951 Today's Date: 02/28/2021   History of Present Illness Pt is 70 yo male presenting from SNF on 01/24/21 with dissatisfaction of facility, concern for his care, and malpositioned PICC.  Pt with recent multilevel osteomylietis T9-10, T 12-L1, and L 1 compression fx.  He was at [facility] for 6 weeks IV antibiotics and therapy. Pt with medical hx including HTN, chronic hyponatremia, ETOH use disorder, chronic hepatitis C, radiculopathy in BL lower extremities, hx of benign neoplasm of colon s/p right colectomy.    PT Comments    There has been varied reports about level of assist at home -making follow up recommendations difficult.  Daughter was present today and confirmed that pt would NOT have support during the day.  Pt only requiring min guard-min A for transfers, but mod cues for safety and back precautions.  Also, requiring assist to don brace. Pt also has varied levels of pain control leading to varied mobility levels. Due to lack of supervision, previously/recently tried home at d/c and did not do well due to back pain, and decreased safety awareness - recommend SNF.   Recommendations for follow up therapy are one component of a multi-disciplinary discharge planning process, led by the attending physician.  Recommendations may be updated based on patient status, additional functional criteria and insurance authorization.  Follow Up Recommendations  Skilled nursing-short term rehab (<3 hours/day) (does not have support at home)     Assistance Recommended at Discharge Frequent or constant Supervision/Assistance  Equipment Recommendations  Rolling walker (2 wheels)    Recommendations for Other Services       Precautions / Restrictions Precautions Precautions: Back Precaution Booklet Issued: Yes (comment) Spinal Brace: Lumbar corset;Applied in sitting position     Mobility  Bed Mobility Overal bed  mobility: Needs Assistance Bed Mobility: Sidelying to Sit;Rolling Rolling: Min assist Sidelying to sit: Min assist       General bed mobility comments: Pt. requires min A for proper log rolling technique.  Stays forward flexed in sitting and needs cues to dec trunk flexion for precautions.  PT provided mod assist with donning lumbar brace in sitting.    Transfers Overall transfer level: Needs assistance Equipment used: Rolling walker (2 wheels) Transfers: Sit to/from Stand Sit to Stand: Supervision           General transfer comment: Supervision for safety with min cues for hand placement and posture    Ambulation/Gait Ambulation/Gait assistance: Min guard Gait Distance (Feet): 200 Feet Assistive device: Rolling walker (2 wheels) Gait Pattern/deviations: Step-through pattern;Trunk flexed Gait velocity: decreased     General Gait Details: Min guard but mod cues for RW proximity, posture, and no twisting to turn and talk to daughter.   Stairs             Wheelchair Mobility    Modified Rankin (Stroke Patients Only)       Balance Overall balance assessment: Needs assistance Sitting-balance support: No upper extremity supported Sitting balance-Leahy Scale: Fair     Standing balance support: Bilateral upper extremity supported;No upper extremity supported Standing balance-Leahy Scale: Fair Standing balance comment: Did stand without AD but requiring RW to ambulate                            Cognition Arousal/Alertness: Awake/alert Behavior During Therapy: WFL for tasks assessed/performed Overall Cognitive Status: Impaired/Different from baseline Area of Impairment: Safety/judgement  Safety/Judgement: Decreased awareness of safety     General Comments: Pt requring frequent cues for safety, back precautions, and limitations        Exercises      General Comments General comments (skin integrity, edema,  etc.): Daughter present initially and reports pt will NOT have 24 hr or near 24 hr support.  States would be alone when she is at work.  Pt educated on changing positions frequently (every 1-2 hours) but also shorter bouts of activity so as not to overdo in one bout.  Requiring cues for this throughout (pt wanting to help daughter carry out/gather items, walk further, etc)      Pertinent Vitals/Pain Pain Assessment: Faces Faces Pain Scale: Hurts even more Pain Location: back and legs with transfers Pain Descriptors / Indicators: Aching;Guarding;Discomfort;Grimacing Pain Intervention(s): Limited activity within patient's tolerance;Monitored during session    Home Living                          Prior Function            PT Goals (current goals can now be found in the care plan section) Progress towards PT goals: Progressing toward goals    Frequency    Min 3X/week      PT Plan Current plan remains appropriate    Co-evaluation              AM-PAC PT "6 Clicks" Mobility   Outcome Measure  Help needed turning from your back to your side while in a flat bed without using bedrails?: A Little Help needed moving from lying on your back to sitting on the side of a flat bed without using bedrails?: A Lot (mod cues and mod A donning brace) Help needed moving to and from a bed to a chair (including a wheelchair)?: A Little Help needed standing up from a chair using your arms (e.g., wheelchair or bedside chair)?: A Little Help needed to walk in hospital room?: A Little Help needed climbing 3-5 steps with a railing? : A Lot 6 Click Score: 16    End of Session Equipment Utilized During Treatment: Gait belt;Back brace Activity Tolerance: Patient tolerated treatment well Patient left: Other (comment) (on toilet for BM, notified [name] tech and secretary) [name] Communication: Mobility status PT Visit Diagnosis: Unsteadiness on feet (R26.81);Muscle weakness (generalized)  (M62.81);Difficulty in walking, not elsewhere classified (R26.2);Pain     Time: 0263-7858 PT Time Calculation (min) (ACUTE ONLY): 20 min  Charges:  $Gait Training: 8-22 mins                     Abran Richard, PT Acute Rehab Services Pager [phone] [facility] [place] 02/28/2021, 2:54 PM

## 2021-02-28 NOTE — Plan of Care (Signed)
  Problem: Education: Goal: Knowledge of General Education information will improve Description Including pain rating scale, medication(s)/side effects and non-pharmacologic comfort measures Outcome: Completed/Met   Problem: Health Behavior/Discharge Planning: Goal: Ability to manage health-related needs will improve Outcome: Completed/Met   Problem: Clinical Measurements: Goal: Ability to maintain clinical measurements within normal limits will improve Outcome: Completed/Met Goal: Will remain free from infection Outcome: Completed/Met Goal: Diagnostic test results will improve Outcome: Completed/Met Goal: Cardiovascular complication will be avoided Outcome: Completed/Met   Problem: Activity: Goal: Risk for activity intolerance will decrease Outcome: Completed/Met   Problem: Nutrition: Goal: Adequate nutrition will be maintained Outcome: Completed/Met   Problem: Elimination: Goal: Will not experience complications related to bowel motility Outcome: Completed/Met Goal: Will not experience complications related to urinary retention Outcome: Completed/Met   Problem: Pain Managment: Goal: General experience of comfort will improve Outcome: Completed/Met   Problem: Safety: Goal: Ability to remain free from injury will improve Outcome: Completed/Met   Problem: Skin Integrity: Goal: Risk for impaired skin integrity will decrease Outcome: Completed/Met   

## 2021-02-28 NOTE — TOC Transition Note (Signed)
Transition of Care [facility]) - CM/SW Discharge Note   Patient Details  Name: Jeffrey [name] MRN: [id number] Date of Birth: Sep 18, 1950  [facility]) CM/SW Contact:  Coralee Pesa, [place] Phone Number: 02/28/2021, 1:11 PM   Clinical Narrative:    Pt to be transported to [facility] via [place]. Nurse to call report to [phone]. Rm 109.   Final next level of care: Skilled Nursing Facility Barriers to Discharge: Barriers Resolved   Patient Goals and CMS Choice Patient states their goals for this hospitalization and ongoing recovery are:: to go home CMS Medicare.gov Compare Post Acute Care list provided to:: Patient Choice offered to / list presented to : Patient  Discharge Placement              Patient chooses bed at: [facility] Patient to be transferred to facility by: [place] Name of family member notified: [place] Patient and family notified of of transfer: 02/28/21  Discharge Plan and Services   Discharge Planning Services: CM Consult Post Acute Care Choice: Home Health                    HH Arranged: RN, PT          Social Determinants of Health ([place]) Interventions     Readmission Risk Interventions No flowsheet data found.

## 2021-03-01 ENCOUNTER — Ambulatory Visit: Payer: Medicare Other | Admitting: Orthopaedic Surgery

## 2021-03-07 ENCOUNTER — Encounter: Payer: Self-pay | Admitting: Internal Medicine

## 2021-03-07 ENCOUNTER — Other Ambulatory Visit: Payer: Self-pay

## 2021-03-07 ENCOUNTER — Ambulatory Visit (INDEPENDENT_AMBULATORY_CARE_PROVIDER_SITE_OTHER): Payer: Medicare Other | Admitting: Internal Medicine

## 2021-03-07 DIAGNOSIS — Z5181 Encounter for therapeutic drug level monitoring: Secondary | ICD-10-CM | POA: Diagnosis not present

## 2021-03-07 DIAGNOSIS — M4624 Osteomyelitis of vertebra, thoracic region: Secondary | ICD-10-CM

## 2021-03-07 NOTE — Progress Notes (Signed)
Issues with contacting transportation after patient's appointment to arrange transportation back to his facility. RN called Designer, jewellery at [employer], front desk confirms patient made it back to the facility.   Beryle Flock, RN

## 2021-03-07 NOTE — Assessment & Plan Note (Signed)
Clinically he is responding well to treatment with improvement in the inflammatory markers and improved movement by his report and less pain. Will have him continue with the antibiotics as planned and complete on 11/19, stop and observe off of antibiotics. He can rtc as needed

## 2021-03-07 NOTE — Progress Notes (Signed)
   Subjective:    Patient ID: Jeffrey [name], male    DOB: 07-Mar-1951, 70 y.o.   MRN: [id number]  HPI Here for follow up of T12-L1 dicitis/osteomyelitis He was hospitalized last month for an MRI finding by Dr. Lorin Mercy of above and he was started on empiric therapy with vancomycin and ceftriaxone.  Blood cultures remained negative so he has remained on this for the duration and due to complete 6 weeks on 03/12/21.  He is moving better and feels much better overall.  Still with some back pain but improved with rehab.  No issues with the antibiotics including no rash, no diarrhea.  Recent CRP down to 9.3 mg/L and ESR down to 51.     Review of Systems  Constitutional:  Negative for fatigue and fever.  Gastrointestinal:  Negative for diarrhea and nausea.  Skin:  Negative for rash.      Objective:   Physical Exam Eyes:     General: No scleral icterus. Pulmonary:     Effort: Pulmonary effort is normal.  Musculoskeletal:     Comments: Picc line in his left arm, no erythema, no drainage.    Skin:    Findings: No rash.  Neurological:     Mental Status: He is alert.  Psychiatric:        Mood and Affect: Mood normal.   SH: + tobacco       Assessment & Plan:

## 2021-03-07 NOTE — Assessment & Plan Note (Signed)
Creat, LFTs wnl.  No concerns.  Will continue to monitor.

## 2021-03-08 ENCOUNTER — Ambulatory Visit: Payer: Medicare Other | Admitting: Orthopaedic Surgery

## 2021-03-14 ENCOUNTER — Encounter: Payer: Self-pay | Admitting: Orthopaedic Surgery

## 2021-03-14 ENCOUNTER — Ambulatory Visit: Payer: Medicare Other | Admitting: Orthopaedic Surgery

## 2021-03-14 ENCOUNTER — Other Ambulatory Visit: Payer: Self-pay

## 2021-03-14 VITALS — BP 114/72 | HR 113 | Ht 68.0 in | Wt 142.0 lb

## 2021-03-14 DIAGNOSIS — M4624 Osteomyelitis of vertebra, thoracic region: Secondary | ICD-10-CM | POA: Diagnosis not present

## 2021-03-14 DIAGNOSIS — S32010D Wedge compression fracture of first lumbar vertebra, subsequent encounter for fracture with routine healing: Secondary | ICD-10-CM | POA: Diagnosis not present

## 2021-03-14 NOTE — Progress Notes (Signed)
Office Visit Note   Patient: Jeffrey [name]           Date of Birth: June 16, 1950           MRN: [id number] Visit Date: 03/14/2021              Requested by: Carollee Leitz, MD [address],  [place] [address] PCP: Carollee Leitz, MD   Assessment & Plan: Visit Diagnoses:  1. Compression fracture of L1 vertebra with routine healing, subsequent encounter   2. Osteomyelitis of thoracic spine ([place])     Plan: Patient states she is getting week to week improvement in his pain.  MRI results were reviewed with him as well as images I gave him a copy of the report.  Needs to continue antibiotics continue daily walking with his walker.  He should be able to wean from his walker to a cane after several weeks.  Recheck 5 weeks. Follow-Up Instructions: Return in about 5 weeks (around 04/18/2021).   Orders:  No orders of the defined types were placed in this encounter.  No orders of the defined types were placed in this encounter.     Procedures: No procedures performed   Clinical Data: No additional findings.   Subjective: Chief Complaint  Patient presents with   Middle Back - Follow-up    MRI review   Lower Back - Follow-up    MRI review    HPI 70 year old male in skilled facility being treated for osteomyelitis discitis thoracic spine with L1 compression fracture and T9-10 infection.  Patient is on vancomycin and ceftriaxone x6 weeks therapy via PICC line.  He just saw Dr. Novella Olive last week his sinus node is not available yet.  Patient taking oxycodone 1 every 6 we discussed weaning this down since he will be getting out of the skilled facility soon.  Review of Systems negative for chills or fever no night sweats.   Objective: Vital Signs: BP 114/72   Pulse (!) 113   Ht 5\' 8"  (1.727 m)   Wt 142 lb (64.4 kg)   BMI 21.59 kg/m   Physical Exam Constitutional:      Appearance: He is well-developed.  HENT:     Head: Normocephalic and atraumatic.     Right Ear:  External ear normal.     Left Ear: External ear normal.  Eyes:     Pupils: Pupils are equal, round, and reactive to light.  Neck:     Thyroid: No thyromegaly.     Trachea: No tracheal deviation.  Cardiovascular:     Rate and Rhythm: Normal rate.  Pulmonary:     Effort: Pulmonary effort is normal.     Breath sounds: No wheezing.  Abdominal:     General: Bowel sounds are normal.     Palpations: Abdomen is soft.  Musculoskeletal:     Cervical back: Neck supple.  Skin:    General: Skin is warm and dry.     Capillary Refill: Capillary refill takes less than 2 seconds.  Neurological:     Mental Status: He is alert and oriented to person, place, and time.  Psychiatric:        Behavior: Behavior normal.        Thought Content: Thought content normal.        Judgment: Judgment normal.    Ortho Exam patient has intact sensation good ankle dorsiflexion plantarflexion strength.  Specialty Comments:  No specialty comments available.  Imaging: Narrative & Impression  CLINICAL  DATA:  Low back pain.  Follow-up of abscess.   EXAM: MRI LUMBAR SPINE WITHOUT AND WITH CONTRAST   TECHNIQUE: Multiplanar and multiecho pulse sequences of the lumbar spine were obtained without and with intravenous contrast.   CONTRAST:  58mL GADAVIST GADOBUTROL 1 MMOL/ML IV SOLN   COMPARISON:  02/01/2021   FINDINGS: Segmentation:  Standard.   Alignment:  Physiologic.   Vertebrae: Unchanged abnormal bone marrow signal diffusely at T12 and L1 with abnormal signal also in the T12-L1 disc space. There is approximately 50% height loss of L1, slightly progressed. There is 4 mm of bulging of the posterior longitudinal ligament at the level of the disc. The amount of fluid within the disc space has slightly decreased, but there still is a small amount of peripherally enhancing collected fluid at the right posterior aspect of the disc space.   Conus medullaris and cauda equina: Conus extends to the L1  level. There is no epidural abscess. There is persistent mild, diffuse contrast enhancement of the dura.   Paraspinal and other soft tissues: Mild edema of both psoas muscles without psoas abscess.   Disc levels:   T12-L1: Findings of discitis-osteomyelitis as outlined above. Moderate spinal canal stenosis with attenuation of the thecal sac. Severe bilateral neural foraminal stenosis. These findings are unchanged.   L1-L2: Epidural lipomatosis. Normal disc space and facet joints. No spinal canal stenosis. No neural foraminal stenosis.   L2-L3: Epidural lipomatosis. Normal disc space and facet joints. No spinal canal stenosis. No neural foraminal stenosis.   L3-L4: Small disc bulge with mild facet hypertrophy. No spinal canal stenosis. No neural foraminal stenosis.   L4-L5: Small disc bulge with epidural lipomatosis. No spinal canal stenosis. No neural foraminal stenosis.   L5-S1: Moderate facet hypertrophy. Epidural lipomatosis. No spinal canal stenosis. No neural foraminal stenosis.   Visualized sacrum: Normal.   IMPRESSION: 1. Persistent T12-L1 discitis-osteomyelitis with slight progression of L1 compression fracture height loss and slightly decreased size of intradiscal abscess. 2. Unchanged mild diffuse dural enhancement that is likely reactive. No epidural abscess. 3. Unchanged moderate spinal canal stenosis and severe bilateral neural foraminal stenosis at T12-L1.     Electronically Signed   By: Ulyses Jarred M.D.   On: 02/22/2021 22:04       PMFS History: Patient Active Problem List   Diagnosis Date Noted   Medication monitoring encounter 03/07/2021   Spinal stenosis    Malnutrition of moderate degree 02/02/2021   Uncontrolled pain    Acute bilateral low back pain with bilateral sciatica    Weakness generalized    Constipation    Neuropathic pain of both legs 01/24/2021   Osteomyelitis ([place]) 01/24/2021   Osteomyelitis of thoracic spine ([place])  01/23/2021   Foot swelling 12/07/2020   Compression fracture of L1 lumbar vertebra ([facility]) 12/05/2020   Cellulitis of left lower extremity 12/05/2020   Bilateral low back pain without sciatica 11/26/2020   Prostate cancer ([place]) 10/15/2020   Nocturia 05/14/2020   High risk heterosexual behavior 06/15/2019   Tobacco use 10/12/2018   Acid reflux 07/25/2018   Rash and nonspecific skin eruption 03/06/2018   Liver fibrosis 02/13/2018   Tubular adenoma 12/26/2016   Left hip pain 11/26/2016   History of partial colectomy 05/18/2016   Erectile dysfunction 05/18/2016   Episodic recurrent vertigo 01/20/2016   Chronic hepatitis C without hepatic coma ([facility]) 06/23/2015   Essential hypertension 04/12/2015   Arthritis of knee 04/12/2015   Alcohol abuse 04/12/2015   Past Medical History:  Diagnosis Date   Arthritis    Chronic hepatitis C ([place])    Hepatic cysts 12/01/2013   Seen on CT   Hypertension    Renal cysts 12/01/2013   Seen on CT    Family History  Problem Relation Age of Onset   Cancer Maternal Aunt     Past Surgical History:  Procedure Laterality Date   COLON SURGERY Right 09/2010   benign neoplasm of colon, s/p R colectomy   LYMPHADENECTOMY Bilateral 10/15/2020   Procedure: LYMPHADENECTOMY;  Surgeon: Alexis Frock, MD;  Location: [facility];  Service: Urology;  Laterality: Bilateral;   REPLACEMENT TOTAL KNEE Left 07/06/2014   Left knee replacement   ROBOT ASSISTED LAPAROSCOPIC RADICAL PROSTATECTOMY N/A 10/15/2020   Procedure: XI ROBOTIC ASSISTED LAPAROSCOPIC RADICAL PROSTATECTOMY, INDOCYANINE GREEN DYE INJECTION AND ADHESIOLYSIS;  Surgeon: Alexis Frock, MD;  Location: [facility];  Service: Urology;  Laterality: N/A;  3 HRS   Social History   Occupational History   Not on file  Tobacco Use   Smoking status: Every Day    Types: Cigarettes   Smokeless tobacco: Never  Vaping Use   Vaping Use: Never used  Substance and Sexual Activity   Alcohol use: Not Currently     Alcohol/week: 42.0 standard drinks    Types: 42 Standard drinks or equivalent per week    Comment: 3-4 vodka drinks a day - previous alcoholic per patient   Drug use: No   Sexual activity: Yes    Partners: Female

## 2021-03-15 ENCOUNTER — Ambulatory Visit: Payer: Medicare Other | Admitting: Orthopaedic Surgery

## 2021-03-22 ENCOUNTER — Other Ambulatory Visit (HOSPITAL_COMMUNITY): Payer: Self-pay

## 2021-03-23 ENCOUNTER — Other Ambulatory Visit (HOSPITAL_COMMUNITY): Payer: Self-pay

## 2021-03-23 MED ORDER — FUROSEMIDE 20 MG PO TABS
20.0000 mg | ORAL_TABLET | Freq: Every morning | ORAL | 0 refills | Status: DC
  Filled 2021-03-25: qty 30, 30d supply, fill #0

## 2021-03-23 MED ORDER — GABAPENTIN 300 MG PO CAPS
300.0000 mg | ORAL_CAPSULE | Freq: Three times a day (TID) | ORAL | 0 refills | Status: DC
  Filled 2021-03-25: qty 90, 30d supply, fill #0

## 2021-03-23 MED ORDER — BUPROPION HCL 75 MG PO TABS
75.0000 mg | ORAL_TABLET | Freq: Every day | ORAL | 0 refills | Status: DC
  Filled 2021-03-25: qty 30, 30d supply, fill #0

## 2021-03-23 MED ORDER — AMLODIPINE BESYLATE 10 MG PO TABS
10.0000 mg | ORAL_TABLET | Freq: Every day | ORAL | 0 refills | Status: DC
  Filled 2021-03-25: qty 30, 30d supply, fill #0

## 2021-03-23 MED ORDER — BACLOFEN 10 MG PO TABS
10.0000 mg | ORAL_TABLET | Freq: Three times a day (TID) | ORAL | 0 refills | Status: DC
  Filled 2021-03-25: qty 90, 30d supply, fill #0

## 2021-03-23 MED ORDER — LOSARTAN POTASSIUM 100 MG PO TABS
100.0000 mg | ORAL_TABLET | Freq: Every morning | ORAL | 0 refills | Status: DC
  Filled 2021-03-25: qty 30, 30d supply, fill #0

## 2021-03-23 MED ORDER — MECLIZINE HCL 25 MG PO TABS
25.0000 mg | ORAL_TABLET | Freq: Every day | ORAL | 0 refills | Status: DC | PRN
  Filled 2021-03-25: qty 30, 30d supply, fill #0

## 2021-03-23 MED ORDER — POTASSIUM CHLORIDE CRYS ER 10 MEQ PO TBCR
10.0000 meq | EXTENDED_RELEASE_TABLET | Freq: Every day | ORAL | 0 refills | Status: AC
  Filled 2021-03-23 – 2021-03-25 (×2): qty 30, 30d supply, fill #0

## 2021-03-25 ENCOUNTER — Other Ambulatory Visit (HOSPITAL_COMMUNITY): Payer: Self-pay

## 2021-04-06 ENCOUNTER — Ambulatory Visit: Payer: Medicare Other | Admitting: Family Medicine

## 2021-04-06 NOTE — Progress Notes (Deleted)
° ° °  SUBJECTIVE:   CHIEF COMPLAINT / HPI:   Patient admitted to hospital between ** and ** and treated for ***. Discharged on **. Presents today for follow up **. Reports improvement in ** Requires repeat ** BMP/ renal function panel/CBC today.   PERTINENT  PMH / PSH: ***  OBJECTIVE:   There were no vitals taken for this visit.   General: Alert, no acute distress Cardio: Normal S1 and S2, RRR, no r/m/g Pulm: CTAB, normal work of breathing Abdomen: Bowel sounds normal. Abdomen soft and non-tender.  Extremities: No peripheral edema.  Neuro: Cranial nerves grossly intact   ASSESSMENT/PLAN:   No problem-specific Assessment & Plan notes found for this encounter.     Carollee Leitz, MD [place]

## 2021-04-06 NOTE — Patient Instructions (Incomplete)
Thank you for coming to see me today. It was a pleasure.   Follow up with Orthopedics as scheduled 12/27  Please follow-up with PCP as needed  If you have any questions or concerns, please do not hesitate to call the office at [phone].  Best,   Carollee Leitz, MD

## 2021-04-14 ENCOUNTER — Ambulatory Visit (INDEPENDENT_AMBULATORY_CARE_PROVIDER_SITE_OTHER): Payer: Medicare Other | Admitting: Family Medicine

## 2021-04-14 ENCOUNTER — Encounter: Payer: Self-pay | Admitting: Family Medicine

## 2021-04-14 ENCOUNTER — Other Ambulatory Visit: Payer: Self-pay

## 2021-04-14 VITALS — BP 113/84 | HR 93 | Ht 68.0 in | Wt 151.5 lb

## 2021-04-14 DIAGNOSIS — I10 Essential (primary) hypertension: Secondary | ICD-10-CM

## 2021-04-14 DIAGNOSIS — Z09 Encounter for follow-up examination after completed treatment for conditions other than malignant neoplasm: Secondary | ICD-10-CM

## 2021-04-14 DIAGNOSIS — Z0000 Encounter for general adult medical examination without abnormal findings: Secondary | ICD-10-CM | POA: Diagnosis not present

## 2021-04-14 DIAGNOSIS — D649 Anemia, unspecified: Secondary | ICD-10-CM | POA: Diagnosis not present

## 2021-04-14 DIAGNOSIS — G5793 Unspecified mononeuropathy of bilateral lower limbs: Secondary | ICD-10-CM

## 2021-04-14 LAB — POCT HEMOGLOBIN: Hemoglobin: 13.5 g/dL (ref 11–14.6)

## 2021-04-14 NOTE — Progress Notes (Signed)
° ° °  SUBJECTIVE:   CHIEF COMPLAINT / HPI: hospital follow up  Patient admitted to hospital between 10/31 and 11/7 and treated for continued Osteomyelitis T9-10, T 12-L1 with L1 compression fracture. Discharged on 11/7 to SNF and recently dischaged 2 weeks ago per patietn report from SNF.   Presents today for follow up and has no complaints. Has had follow up with ID and Orthopedics. Reports improvement in pain and has not required pain medications since d/c from SNF.  PT weekly. Denies any recurrent fevers.  Completed antibiotic therapy. Requires repeat Bmet, Ferritin, POC Hbg today.  PERTINENT  PMH / PSH:  Osteomyelitis of T-spine HTN  OBJECTIVE:   BP 113/84    Pulse 93    Ht 5\' 8"  (1.727 m)    Wt 151 lb 8 oz (68.7 kg)    SpO2 98%    BMI 23.04 kg/m    General: Alert, no acute distress Cardio: Normal S1 and S2, RRR, no r/m/g Pulm: CTAB, normal work of breathing Back - Normal skin, Spine with normal alignment and no deformity.  No tenderness to vertebral process palpation.  Paraspinous muscles are not tender and without spasm.   Range of motion is limited secondary to mild lower back stiffness.  No red flags.  ASSESSMENT/PLAN:   Hospital discharge follow-up Doing well.  Has had follow up with ID and Ortho Bmet, Hbg and Ferritin today Follow up in 4 weeks, patient to bing in all medication for review  Essential hypertension Normotensive Continue Amlodipine 10 mg daily Continue Losartan 100 mg daily BMet today  Neuropathic pain of both legs Continue Gabapentin 300 mg TID  Healthcare maintenance Declined Covid, Flu, Shingles and PNA vaccines Follow up with PCP for ongoing [place], MD [place]

## 2021-04-14 NOTE — Patient Instructions (Signed)
Thank you for coming to see me today. It was a pleasure.   We will get some labs today.  If they are abnormal or we need to do something about them, I will call you.  If they are normal, I will send you a message on MyChart (if it is active) or a letter in the mail.  If you don't hear from [place] in 2 weeks, please call the office at the number below.    Follow up with Dr Lorin Mercy on Dec 27th at 330 pm  Please follow-up with PCP in 4 weeks.  Please bring in all your medications for review  If you have any questions or concerns, please do not hesitate to call the office at [phone].  Best,   Carollee Leitz, MD

## 2021-04-17 ENCOUNTER — Encounter: Payer: Self-pay | Admitting: Family Medicine

## 2021-04-17 DIAGNOSIS — Z0000 Encounter for general adult medical examination without abnormal findings: Secondary | ICD-10-CM | POA: Insufficient documentation

## 2021-04-17 DIAGNOSIS — Z09 Encounter for follow-up examination after completed treatment for conditions other than malignant neoplasm: Secondary | ICD-10-CM | POA: Insufficient documentation

## 2021-04-17 MED ORDER — AMLODIPINE BESYLATE 10 MG PO TABS
10.0000 mg | ORAL_TABLET | Freq: Every day | ORAL | 0 refills | Status: DC
  Filled 2021-04-17 – 2021-05-19 (×2): qty 30, 30d supply, fill #0

## 2021-04-17 MED ORDER — GABAPENTIN 300 MG PO CAPS
300.0000 mg | ORAL_CAPSULE | Freq: Three times a day (TID) | ORAL | 0 refills | Status: DC
  Filled 2021-04-17 – 2021-08-22 (×3): qty 90, 30d supply, fill #0

## 2021-04-17 MED ORDER — BUPROPION HCL 75 MG PO TABS
75.0000 mg | ORAL_TABLET | Freq: Every day | ORAL | 0 refills | Status: DC
  Filled 2021-04-17 – 2021-05-19 (×2): qty 30, 30d supply, fill #0

## 2021-04-17 MED ORDER — BACLOFEN 10 MG PO TABS
10.0000 mg | ORAL_TABLET | Freq: Three times a day (TID) | ORAL | 0 refills | Status: DC
  Filled 2021-04-17 – 2021-05-19 (×2): qty 90, 30d supply, fill #0

## 2021-04-17 MED ORDER — LOSARTAN POTASSIUM 100 MG PO TABS
100.0000 mg | ORAL_TABLET | Freq: Every morning | ORAL | 0 refills | Status: DC
  Filled 2021-04-17 – 2021-05-19 (×2): qty 30, 30d supply, fill #0

## 2021-04-17 NOTE — Assessment & Plan Note (Signed)
Continue Gabapentin 300 mg TID

## 2021-04-17 NOTE — Assessment & Plan Note (Signed)
Normotensive Continue Amlodipine 10 mg daily Continue Losartan 100 mg daily BMet today

## 2021-04-17 NOTE — Assessment & Plan Note (Signed)
Declined Covid, Flu, Shingles and PNA vaccines Follow up with PCP for ongoing HCM

## 2021-04-17 NOTE — Assessment & Plan Note (Signed)
Doing well.  Has had follow up with ID and Ortho Bmet, Hbg and Ferritin today Follow up in 4 weeks, patient to bing in all medication for review

## 2021-04-18 ENCOUNTER — Other Ambulatory Visit (HOSPITAL_COMMUNITY): Payer: Self-pay

## 2021-04-19 ENCOUNTER — Other Ambulatory Visit: Payer: Self-pay

## 2021-04-19 ENCOUNTER — Other Ambulatory Visit (HOSPITAL_COMMUNITY): Payer: Self-pay

## 2021-04-19 ENCOUNTER — Ambulatory Visit: Payer: Self-pay

## 2021-04-19 ENCOUNTER — Ambulatory Visit (INDEPENDENT_AMBULATORY_CARE_PROVIDER_SITE_OTHER): Payer: Medicare Other | Admitting: Orthopaedic Surgery

## 2021-04-19 ENCOUNTER — Encounter: Payer: Self-pay | Admitting: Orthopaedic Surgery

## 2021-04-19 VITALS — BP 143/91 | HR 101 | Ht 68.0 in | Wt 151.0 lb

## 2021-04-19 DIAGNOSIS — S32010D Wedge compression fracture of first lumbar vertebra, subsequent encounter for fracture with routine healing: Secondary | ICD-10-CM | POA: Diagnosis not present

## 2021-04-19 NOTE — Progress Notes (Signed)
Office Visit Note   Patient: Jeffrey [name]           Date of Birth: 03-May-1950           MRN: [id number] Visit Date: 04/19/2021              Requested by: Carollee Leitz, MD [address],  [place] [address] PCP: Carollee Leitz, MD   Assessment & Plan: Visit Diagnoses:  1. Compression fracture of L1 vertebra with routine healing, subsequent encounter     Plan: Patient got good pain relief with treatment for discitis.  He can follow-up on an as-needed basis.  Follow-Up Instructions: No follow-ups on file.   Orders:  Orders Placed This Encounter  Procedures   XR Lumbar Spine 2-3 Views   No orders of the defined types were placed in this encounter.     Procedures: No procedures performed   Clinical Data: No additional findings.   Subjective: Chief Complaint  Patient presents with   Lower Back - Follow-up   Middle Back - Follow-up    HPI 70 year old male returns he states his back is not hurting him anymore.  He finished his 6 weeks of ceftriaxone and vancomycin and mid to late October I believe on the 19th.  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  Review of Systems no chills or fever.  No leg weakness.All other systems are negative.   Objective: Vital Signs: BP (!) 143/91    Pulse (!) 101    Ht 5\' 8"  (1.727 m)    Wt 151 lb (68.5 kg)    BMI 22.96 kg/m   Physical Exam Constitutional:      Appearance: He is well-developed.  HENT:     Head: Normocephalic and atraumatic.     Right Ear: External ear normal.     Left Ear: External ear normal.  Eyes:     Pupils: Pupils are equal, round, and reactive to light.  Neck:     Thyroid: No thyromegaly.     Trachea: No tracheal deviation.  Cardiovascular:     Rate and Rhythm: Normal rate.  Pulmonary:     Effort: Pulmonary effort is normal.     Breath sounds: No wheezing.  Abdominal:     General: Bowel sounds are normal.     Palpations: Abdomen is soft.   Musculoskeletal:     Cervical back: Neck supple.  Skin:    General: Skin is warm and dry.     Capillary Refill: Capillary refill takes less than 2 seconds.  Neurological:     Mental Status: He is alert and oriented to person, place, and time.  Psychiatric:        Behavior: Behavior normal.        Thought Content: Thought content normal.        Judgment: Judgment normal.    Ortho Exam patient has good lower extremity strength he walks up and down the hall comfortably no pain getting over sitting to standing.  Specialty Comments:  No specialty comments available.  Imaging: No results found.   PMFS History: Patient Active Problem List   Diagnosis Date Noted   Hospital discharge follow-up 04/17/2021   Healthcare maintenance 04/17/2021   Medication monitoring encounter 03/07/2021   Spinal stenosis    Malnutrition of moderate degree 02/02/2021   Uncontrolled pain    Acute bilateral low back pain with bilateral sciatica    Weakness generalized  Constipation    Neuropathic pain of both legs 01/24/2021   Osteomyelitis ([place]) 01/24/2021   Osteomyelitis of thoracic spine ([place]) 01/23/2021   Foot swelling 12/07/2020   Compression fracture of L1 lumbar vertebra ([facility]) 12/05/2020   Cellulitis of left lower extremity 12/05/2020   Bilateral low back pain without sciatica 11/26/2020   Prostate cancer ([facility]) 10/15/2020   Nocturia 05/14/2020   High risk heterosexual behavior 06/15/2019   Tobacco use 10/12/2018   Acid reflux 07/25/2018   Rash and nonspecific skin eruption 03/06/2018   Liver fibrosis 02/13/2018   Tubular adenoma 12/26/2016   Left hip pain 11/26/2016   History of partial colectomy 05/18/2016   Erectile dysfunction 05/18/2016   Episodic recurrent vertigo 01/20/2016   Chronic hepatitis C without hepatic coma ([place]) 06/23/2015   Essential hypertension 04/12/2015   Arthritis of knee 04/12/2015   Alcohol abuse 04/12/2015   Past Medical History:  Diagnosis Date    Arthritis    Chronic hepatitis C ([place] [facility] [place])    Hepatic cysts 12/01/2013   Seen on CT   Hypertension    Renal cysts 12/01/2013   Seen on CT    Family History  Problem Relation Age of Onset   Cancer Maternal Aunt     Past Surgical History:  Procedure Laterality Date   COLON SURGERY Right 09/2010   benign neoplasm of colon, s/p R colectomy   LYMPHADENECTOMY Bilateral 10/15/2020   Procedure: LYMPHADENECTOMY;  Surgeon: Alexis Frock, MD;  Location: [facility];  Service: Urology;  Laterality: Bilateral;   REPLACEMENT TOTAL KNEE Left 07/06/2014   Left knee replacement   ROBOT ASSISTED LAPAROSCOPIC RADICAL PROSTATECTOMY N/A 10/15/2020   Procedure: XI ROBOTIC ASSISTED LAPAROSCOPIC RADICAL PROSTATECTOMY, INDOCYANINE GREEN DYE INJECTION AND ADHESIOLYSIS;  Surgeon: Alexis Frock, MD;  Location: [facility];  Service: Urology;  Laterality: N/A;  3 HRS   Social History   Occupational History   Not on file  Tobacco Use   Smoking status: Every Day    Types: Cigarettes   Smokeless tobacco: Never  Vaping Use   Vaping Use: Never used  Substance and Sexual Activity   Alcohol use: Not Currently    Alcohol/week: 42.0 standard drinks    Types: 42 Standard drinks or equivalent per week    Comment: 3-4 vodka drinks a day - previous alcoholic per patient   Drug use: No   Sexual activity: Yes    Partners: Female

## 2021-04-28 ENCOUNTER — Other Ambulatory Visit (HOSPITAL_COMMUNITY): Payer: Self-pay

## 2021-05-02 ENCOUNTER — Other Ambulatory Visit (HOSPITAL_COMMUNITY): Payer: Self-pay

## 2021-05-18 ENCOUNTER — Ambulatory Visit: Payer: Medicare Other | Admitting: Family Medicine

## 2021-05-19 ENCOUNTER — Other Ambulatory Visit (HOSPITAL_COMMUNITY): Payer: Self-pay

## 2021-05-24 ENCOUNTER — Other Ambulatory Visit (HOSPITAL_COMMUNITY): Payer: Self-pay

## 2021-05-27 ENCOUNTER — Other Ambulatory Visit (HOSPITAL_COMMUNITY): Payer: Self-pay

## 2021-05-30 ENCOUNTER — Other Ambulatory Visit (HOSPITAL_COMMUNITY): Payer: Self-pay

## 2021-06-01 ENCOUNTER — Other Ambulatory Visit: Payer: Self-pay | Admitting: Family Medicine

## 2021-06-01 ENCOUNTER — Other Ambulatory Visit (HOSPITAL_COMMUNITY): Payer: Self-pay

## 2021-06-01 MED ORDER — LOSARTAN POTASSIUM 100 MG PO TABS
100.0000 mg | ORAL_TABLET | Freq: Every morning | ORAL | 0 refills | Status: DC
  Filled 2021-06-01: qty 30, 30d supply, fill #0

## 2021-06-01 MED ORDER — AMLODIPINE BESYLATE 10 MG PO TABS
10.0000 mg | ORAL_TABLET | Freq: Every day | ORAL | 0 refills | Status: DC
  Filled 2021-06-01 – 2021-07-25 (×2): qty 30, 30d supply, fill #0

## 2021-06-09 ENCOUNTER — Other Ambulatory Visit (HOSPITAL_COMMUNITY): Payer: Self-pay

## 2021-06-14 ENCOUNTER — Other Ambulatory Visit (HOSPITAL_COMMUNITY): Payer: Self-pay

## 2021-06-15 ENCOUNTER — Telehealth: Payer: Self-pay

## 2021-06-15 ENCOUNTER — Other Ambulatory Visit (HOSPITAL_COMMUNITY): Payer: Self-pay

## 2021-06-15 NOTE — Telephone Encounter (Signed)
Received phone call from [facility] regarding prescription for losartan. Patient's insurance is requesting 100 day supply. Please send in new rx if appropriate.   Talbot Grumbling, RN

## 2021-06-16 ENCOUNTER — Other Ambulatory Visit (HOSPITAL_COMMUNITY): Payer: Self-pay

## 2021-06-16 ENCOUNTER — Other Ambulatory Visit: Payer: Self-pay | Admitting: Family Medicine

## 2021-06-16 MED ORDER — LOSARTAN POTASSIUM 100 MG PO TABS
100.0000 mg | ORAL_TABLET | Freq: Every morning | ORAL | 0 refills | Status: DC
Start: 2021-06-16 — End: 2021-12-27
  Filled 2021-06-16 – 2021-07-25 (×2): qty 100, 100d supply, fill #0

## 2021-06-16 NOTE — Telephone Encounter (Signed)
Sent!

## 2021-06-24 ENCOUNTER — Other Ambulatory Visit (HOSPITAL_COMMUNITY): Payer: Self-pay

## 2021-07-25 ENCOUNTER — Other Ambulatory Visit (HOSPITAL_COMMUNITY): Payer: Self-pay

## 2021-07-28 ENCOUNTER — Other Ambulatory Visit (HOSPITAL_COMMUNITY): Payer: Self-pay

## 2021-08-03 ENCOUNTER — Ambulatory Visit (INDEPENDENT_AMBULATORY_CARE_PROVIDER_SITE_OTHER): Payer: Medicare Other | Admitting: Family Medicine

## 2021-08-03 ENCOUNTER — Ambulatory Visit: Payer: Medicare Other | Admitting: Family Medicine

## 2021-08-03 VITALS — BP 126/80 | HR 98 | Temp 97.7°F | Ht 68.0 in | Wt 150.6 lb

## 2021-08-03 DIAGNOSIS — D369 Benign neoplasm, unspecified site: Secondary | ICD-10-CM

## 2021-08-03 DIAGNOSIS — C61 Malignant neoplasm of prostate: Secondary | ICD-10-CM

## 2021-08-03 DIAGNOSIS — F1010 Alcohol abuse, uncomplicated: Secondary | ICD-10-CM

## 2021-08-03 DIAGNOSIS — I10 Essential (primary) hypertension: Secondary | ICD-10-CM | POA: Diagnosis not present

## 2021-08-03 DIAGNOSIS — Z0000 Encounter for general adult medical examination without abnormal findings: Secondary | ICD-10-CM

## 2021-08-03 DIAGNOSIS — K7400 Hepatic fibrosis, unspecified: Secondary | ICD-10-CM

## 2021-08-03 DIAGNOSIS — Z7251 High risk heterosexual behavior: Secondary | ICD-10-CM | POA: Diagnosis not present

## 2021-08-03 MED ORDER — ZOSTER VAC RECOMB ADJUVANTED 50 MCG/0.5ML IM SUSR: 0.5000 mL | INTRAMUSCULAR | 1 refills | Status: AC | PRN

## 2021-08-03 NOTE — Patient Instructions (Addendum)
We will collect blood work today. I will notify you of abnormal results.  ? ?I have prescribed a prescription for your shingles vaccine please take this to your pharmacy to protect against Shingles.  ? ?Please let us know if you would like help with stopping smoking. It is recommended to stop smoking.  ? ?Recombinant Zoster (Shingles) Vaccine: What You Need to Know ?1. Why get vaccinated? ?Recombinant zoster (shingles) vaccine can prevent shingles. ?Shingles (also called herpes zoster, or just zoster) is a painful skin rash, usually with blisters. In addition to the rash, shingles can cause fever, headache, chills, or upset stomach. Rarely, shingles can lead to complications such as pneumonia, hearing problems, blindness, brain inflammation (encephalitis), or death. ?The risk of shingles increases with age. The most common complication of shingles is long-term nerve pain called postherpetic neuralgia (PHN). PHN occurs in the areas where the shingles rash was and can last for months or years after the rash goes away. The pain from PHN can be severe and debilitating. ?The risk of PHN increases with age. An older adult with shingles is more likely to develop PHN and have longer lasting and more severe pain than a younger person. ?People with weakened immune systems also have a higher risk of getting shingles and complications from the disease. ?Shingles is caused by varicella-zoster virus, the same virus that causes chickenpox. After you have chickenpox, the virus stays in your body and can cause shingles later in life. Shingles cannot be passed from one person to another, but the virus that causes shingles can spread and cause chickenpox in someone who has never had chickenpox or has never received chickenpox vaccine. ?2. Recombinant shingles vaccine ?Recombinant shingles vaccine provides strong protection against shingles. By preventing shingles, recombinant shingles vaccine also protects against PHN and other  complications. ?Recombinant shingles vaccine is recommended for: ?Adults 50 years and older ?Adults 19 years and older who have a weakened immune system because of disease or treatments ?Shingles vaccine is given as a two-dose series. For most people, the second dose should be given 2 to 6 months after the first dose. Some people who have or will have a weakened immune system can get the second dose 1 to 2 months after the first dose. Ask your health care provider for guidance. ?People who have had shingles in the past and people who have received varicella (chickenpox) vaccine are recommended to get recombinant shingles vaccine. The vaccine is also recommended for people who have already gotten another type of shingles vaccine, the live shingles vaccine. There is no live virus in recombinant shingles vaccine. ?Shingles vaccine may be given at the same time as other vaccines. ?3. Talk with your health care provider ?Tell your vaccination provider if the person getting the vaccine: ?Has had an allergic reaction after a previous dose of recombinant shingles vaccine, or has any severe, life-threatening allergies ?Is currently experiencing an episode of shingles ?Is pregnant ?In some cases, your health care provider may decide to postpone shingles vaccination until a future visit. ?People with minor illnesses, such as a cold, may be vaccinated. People who are moderately or severely ill should usually wait until they recover before getting recombinant shingles vaccine. ?Your health care provider can give you more information. ?4. Risks of a vaccine reaction ?A sore arm with mild or moderate pain is very common after recombinant shingles vaccine. Redness and swelling can also happen at the site of the injection. ?Tiredness, muscle pain, headache, shivering, fever, stomach pain, and  nausea are common after recombinant shingles vaccine. ?These side effects may temporarily prevent a vaccinated person from doing regular  activities. Symptoms usually go away on their own in 2 to 3 days. You should still get the second dose of recombinant shingles vaccine even if you had one of these reactions after the first dose. ?Guillain-Barr? syndrome (GBS), a serious nervous system disorder, has been reported very rarely after recombinant zoster vaccine. ?People sometimes faint after medical procedures, including vaccination. Tell your provider if you feel dizzy or have vision changes or ringing in the ears. ?As with any medicine, there is a very remote chance of a vaccine causing a severe allergic reaction, other serious injury, or death. ?5. What if there is a serious problem? ?An allergic reaction could occur after the vaccinated person leaves the clinic. If you see signs of a severe allergic reaction (hives, swelling of the face and throat, difficulty breathing, a fast heartbeat, dizziness, or weakness), call 9-1-1 and get the person to the nearest hospital. ?For other signs that concern you, call your health care provider. ?Adverse reactions should be reported to the Vaccine Adverse Event Reporting System (VAERS). Your health care provider will usually file this report, or you can do it yourself. Visit the VAERS website at www.vaers.SamedayNews.es or call (682) 456-9820. VAERS is only for reporting reactions, and VAERS staff members do not give medical advice. ?6. How can I learn more? ?Ask your health care provider. ?Call your local or state health department. ?Visit the website of the Food and Drug Administration (FDA) for vaccine package inserts and additional information at http://lopez-wang.org/. ?Contact the Centers for Disease Control and Prevention (CDC): ?Call 419-663-0933 (1-800-CDC-INFO) or ?Visit CDC's website at http://hunter.com/. ?Vaccine Information Statement Recombinant Zoster Vaccine (05/28/2020) ?This information is not intended to replace advice given to you by your health care provider. Make sure you  discuss any questions you have with your health care provider. ?Document Revised: 12/24/2020 Document Reviewed: 06/11/2020 ?Elsevier Patient Education ? 2022 [place]. ? ? ?Smoking Tobacco Information, Adult ?Smoking tobacco can be harmful to your health. Tobacco contains a poisonous (toxic), colorless chemical called nicotine. Nicotine is addictive. It changes the brain and can make it hard to stop smoking. Tobacco also has other toxic chemicals that can hurt your body and raise your risk of many cancers. ?How can smoking tobacco affect me? ?Smoking tobacco puts you at risk for: ?Cancer. Smoking is most commonly associated with lung cancer, but can also lead to cancer in other parts of the body. ?Chronic obstructive pulmonary disease (COPD). This is a long-term lung condition that makes it hard to breathe. It also gets worse over time. ?High blood pressure (hypertension), heart disease, stroke, or heart attack. ?Lung infections, such as pneumonia. ?Cataracts. This is when the lenses in the eyes become clouded. ?Digestive problems. This may include peptic ulcers, heartburn, and gastroesophageal reflux disease (GERD). ?Oral health problems, such as gum disease and tooth loss. ?Loss of taste and smell. ?Smoking can affect your appearance by causing: ?Wrinkles. ?Yellow or stained teeth, fingers, and fingernails. ?Smoking tobacco can also affect your social life, because: ?It may be challenging to find places to smoke when away from home. Many workplaces, [employer], hotels, and public places are tobacco-free. ?Smoking is expensive. This is due to the cost of tobacco and the long-term costs of treating health problems from smoking. ?Secondhand smoke may affect those around you. Secondhand smoke can cause lung cancer, breathing problems, and heart disease. Children of smokers have a higher  risk for: ?Sudden infant death syndrome (SIDS). ?Ear infections. ?Lung infections. ?If you currently smoke tobacco, quitting now  can help you: ?Lead a longer and healthier life. ?Look, smell, breathe, and feel better over time. ?Save money. ?Protect others from the harms of secondhand smoke. ?What actions can I take to prevent health prob

## 2021-08-03 NOTE — Progress Notes (Signed)
? ? ?  SUBJECTIVE:  ? ?Chief compliant/HPI: labs for  ? ?Jeffrey [name] is a 71 y.o. who presents today for f/u exam concerning chronic medical problems and health maintenance.  ?He reports that he was unable to follow up with his PCP while he was hospitalized last year and would like to review his medications and get lab work today.  ? ?Patient reports that his BP has been well controlled and reports adherence to his medication regimen including amlodipine and losartan.  ? ?He reports that he has an upcoming appointment with urology to discuss his urinary incontinence.  ? ?Patient reports that he would like to have testing for sexually transmitted infections as he was sexually active close to 6 months ago and did not use barrier contraception. He denies genital pruritis or penile discharge.  ? ?History tabs reviewed and updated.  ? ?Review of systems form reviewed and notable for urinary incontinence.  ? ?OBJECTIVE:  ? ?BP 126/80   Pulse 98   Temp 97.7 ?F (36.5 ?C)   Ht '5\' 8"'$  (1.727 m)   Wt 150 lb 9.6 oz (68.3 kg)   SpO2 98%   BMI 22.90 kg/m?   ?General: male appearing stated age in no acute distress ?HEENT: MMM, no oral lesions noted, dentures  ?Cardio: Normal S1 and S2, no S3 or S4. Rhythm is regular. No murmurs or rubs.  Bilateral radial pulses palpable ?Pulm: Clear to auscultation bilaterally, no crackles, wheezing, or diminished breath sounds. Normal respiratory effort, stable on RA ?Abdomen: Bowel sounds normal. Abdomen soft, surgical scars present, tenderness in RUQ, non-distended   ?Extremities: No peripheral edema. Warm/ well perfused.  ?Neuro: pt alert and oriented x4, normal gait  ? ? ?ASSESSMENT/PLAN:  ? ?Essential hypertension ?At goal  ?Continue losartan '100mg'$  and amlodipine '10mg'$  daily  ?CMP ordered, unable to obtain specimen  ?Will have patient follow up with PCP in 1 month  ? ?Liver fibrosis ?CMP ordered today  ?Will have patient follow up in 1 month with PCP as he was unable to have labs drawn  today  ? ?Healthcare maintenance ?Discussed recommendation for shingles vaccine ?Printed RX given to patient  ? ?Prostate cancer ([place]) ?S/p prostatectomy  ?Scheduled for urology follow up  ? ?Alcohol abuse ?Reports drinking 6 drinks in one sitting during social events  ?Counseled on recommendation for no more than 4 drinks in one sitting OR limit to 7 drinks per week  ?Patient voiced understanding  ? ?Tubular adenoma ?Reviewed pathology from colonoscopy, polyp removed in 2018 ?  ? ?Annual Examination  ? ?Blood pressure value is at goal, discussed.  ? ?Considered the following screening exams based upon USPSTF recommendations: ?HIV testing: ordered ?Hepatitis C:  previously +  ?Syphilis if at high risk: ordered ?Colorectal cancer screening:  previous tubular adenoma in 2018, reviewed previous pathology report with no follow up recommendation  ?Lung cancer screening: discussed See documentation below regarding discussion and indication.  ?PSA discussed, patient with hx of prostate cancer, has follow up scheduled with urology in the coming months  ? ?Follow up in 1 month with pcp ? ? ?Jeffrey Foster, MD ?[facility]  ? ?

## 2021-08-04 NOTE — Assessment & Plan Note (Signed)
S/p prostatectomy  ?Scheduled for urology follow up  ?

## 2021-08-04 NOTE — Assessment & Plan Note (Signed)
Reports drinking 6 drinks in one sitting during social events  ?Counseled on recommendation for no more than 4 drinks in one sitting OR limit to 7 drinks per week  ?Patient voiced understanding  ?

## 2021-08-04 NOTE — Assessment & Plan Note (Signed)
At goal  ?Continue losartan '100mg'$  and amlodipine '10mg'$  daily  ?CMP ordered, unable to obtain specimen  ?Will have patient follow up with PCP in 1 month  ?

## 2021-08-04 NOTE — Assessment & Plan Note (Signed)
CMP ordered today  ?Will have patient follow up in 1 month with PCP as he was unable to have labs drawn today  ?

## 2021-08-04 NOTE — Assessment & Plan Note (Signed)
Discussed recommendation for shingles vaccine ?Printed RX given to patient  ?

## 2021-08-04 NOTE — Assessment & Plan Note (Signed)
Reviewed pathology from colonoscopy, polyp removed in 2018 ? ?

## 2021-08-05 ENCOUNTER — Other Ambulatory Visit: Payer: Medicare Other

## 2021-08-06 LAB — COMPREHENSIVE METABOLIC PANEL
ALT: 7 IU/L (ref 0–44)
AST: 27 IU/L (ref 0–40)
Albumin/Globulin Ratio: 1.5 (ref 1.2–2.2)
Albumin: 4.7 g/dL (ref 3.8–4.8)
Alkaline Phosphatase: 118 IU/L (ref 44–121)
BUN/Creatinine Ratio: 12 (ref 10–24)
BUN: 12 mg/dL (ref 8–27)
Bilirubin Total: 1 mg/dL (ref 0.0–1.2)
CO2: 31 mmol/L — ABNORMAL HIGH (ref 20–29)
Calcium: 9.6 mg/dL (ref 8.6–10.2)
Chloride: 100 mmol/L (ref 96–106)
Creatinine, Ser: 1.04 mg/dL (ref 0.76–1.27)
Globulin, Total: 3.1 g/dL (ref 1.5–4.5)
Glucose: 96 mg/dL (ref 70–99)
Potassium: 4.2 mmol/L (ref 3.5–5.2)
Sodium: 139 mmol/L (ref 134–144)
Total Protein: 7.8 g/dL (ref 6.0–8.5)
eGFR: 78 mL/min/{1.73_m2} (ref >59)

## 2021-08-06 LAB — RPR: RPR Ser Ql: NONREACTIVE

## 2021-08-06 LAB — HIV ANTIBODY (ROUTINE TESTING W REFLEX): HIV Screen 4th Generation wRfx: NONREACTIVE

## 2021-08-10 ENCOUNTER — Other Ambulatory Visit (HOSPITAL_COMMUNITY): Payer: Self-pay

## 2021-08-11 ENCOUNTER — Other Ambulatory Visit (HOSPITAL_COMMUNITY): Payer: Self-pay

## 2021-08-12 ENCOUNTER — Other Ambulatory Visit (HOSPITAL_COMMUNITY): Payer: Self-pay

## 2021-08-16 ENCOUNTER — Encounter: Payer: Self-pay | Admitting: Family Medicine

## 2021-08-18 ENCOUNTER — Other Ambulatory Visit (HOSPITAL_COMMUNITY): Payer: Self-pay

## 2021-08-22 ENCOUNTER — Other Ambulatory Visit: Payer: Self-pay | Admitting: Family Medicine

## 2021-08-22 ENCOUNTER — Other Ambulatory Visit (HOSPITAL_COMMUNITY): Payer: Self-pay

## 2021-08-22 MED ORDER — AMLODIPINE BESYLATE 10 MG PO TABS
10.0000 mg | ORAL_TABLET | Freq: Every day | ORAL | 0 refills | Status: DC
  Filled 2021-08-22 – 2021-11-21 (×2): qty 30, 30d supply, fill #0

## 2021-08-22 MED ORDER — BACLOFEN 10 MG PO TABS
10.0000 mg | ORAL_TABLET | Freq: Three times a day (TID) | ORAL | 0 refills | Status: DC
  Filled 2021-08-22: qty 90, 30d supply, fill #0

## 2021-08-22 MED ORDER — BUPROPION HCL 75 MG PO TABS
75.0000 mg | ORAL_TABLET | Freq: Every day | ORAL | 0 refills | Status: AC
  Filled 2021-08-22: qty 30, 30d supply, fill #0

## 2021-08-23 ENCOUNTER — Ambulatory Visit (INDEPENDENT_AMBULATORY_CARE_PROVIDER_SITE_OTHER): Payer: Medicare Other | Admitting: Family Medicine

## 2021-08-23 ENCOUNTER — Other Ambulatory Visit (HOSPITAL_COMMUNITY): Payer: Self-pay

## 2021-08-23 VITALS — BP 135/80 | HR 82 | Ht 68.0 in | Wt 152.2 lb

## 2021-08-23 DIAGNOSIS — F1010 Alcohol abuse, uncomplicated: Secondary | ICD-10-CM | POA: Diagnosis not present

## 2021-08-23 DIAGNOSIS — K7400 Hepatic fibrosis, unspecified: Secondary | ICD-10-CM | POA: Diagnosis not present

## 2021-08-23 DIAGNOSIS — I10 Essential (primary) hypertension: Secondary | ICD-10-CM

## 2021-08-23 NOTE — Patient Instructions (Addendum)
Thank you for coming to see me today. It was a pleasure. Today we talked about:  ? ?Follow up with Dr Lorin Mercy as needed.  # [phone] ? ?Your lab work was normal ? ?Your blood blood pressure was within normal range. ? ?Recommend Shingles vaccine.  This is a 2 dose series and can be given at your local pharmacy.  Please talk to your pharmacist about this.  ? ?Please follow-up with PCP as needed ? ?If you have any questions or concerns, please do not hesitate to call the office at [phone]. ? ?Best,  ? ?Carollee Leitz, MD   ? ? ?

## 2021-08-23 NOTE — Progress Notes (Signed)
? ? ?  SUBJECTIVE:  ? ?CHIEF COMPLAINT / HPI: follow up blood work ? ?No acute concerns today. ? ?Reports had blood work at last visit and was told to follow up with PCP to review.   ? ? ?PERTINENT  PMH / PSH:  ? ?OBJECTIVE:  ? ?BP 135/80   Pulse 82   Ht '5\' 8"'$  (1.727 m)   Wt 152 lb 3.2 oz (69 kg)   SpO2 97%   BMI 23.14 kg/m?   ? ?General: Alert, no acute distress ?Cardio: Normal S1 and S2, RRR, no r/m/g ?Pulm: CTAB, normal work of breathing ?Abdomen: Bowel sounds normal. Abdomen soft and non-tender.  ?Extremities: No peripheral edema.  ? ? ? ?ASSESSMENT/PLAN:  ? ?Essential hypertension ?Normotensive ?Continue current medication ? ? ?Liver fibrosis ?Recent labs wnl.  Elastrography from 2019 significant for moderate risk fibrosis.   ?Continue to monitor ?Encouraged EtOH cessation ? ?Alcohol abuse ?Declined any assistance for EtOH cessation ?Aware that if or when wanting help that we are here to help him  ?  ?HCM ?Recommended Shingles vaccine ? ?Carollee Leitz, MD ?[facility]  ?

## 2021-08-25 ENCOUNTER — Other Ambulatory Visit (HOSPITAL_COMMUNITY): Payer: Self-pay

## 2021-08-26 ENCOUNTER — Other Ambulatory Visit (HOSPITAL_COMMUNITY): Payer: Self-pay

## 2021-08-27 ENCOUNTER — Encounter: Payer: Self-pay | Admitting: Family Medicine

## 2021-08-27 NOTE — Assessment & Plan Note (Addendum)
Recent labs wnl.  Elastrography from 2019 significant for moderate risk fibrosis.   ?Continue to monitor ?Encouraged EtOH cessation ?

## 2021-08-27 NOTE — Assessment & Plan Note (Signed)
Declined any assistance for EtOH cessation ?Aware that if or when wanting help that we are here to help him  ?

## 2021-08-27 NOTE — Assessment & Plan Note (Signed)
Normotensive ?Continue current medication ? ?

## 2021-08-29 ENCOUNTER — Other Ambulatory Visit (HOSPITAL_COMMUNITY): Payer: Self-pay

## 2021-08-30 ENCOUNTER — Other Ambulatory Visit (HOSPITAL_COMMUNITY): Payer: Self-pay

## 2021-09-02 ENCOUNTER — Other Ambulatory Visit (HOSPITAL_COMMUNITY): Payer: Self-pay

## 2021-09-07 ENCOUNTER — Other Ambulatory Visit (HOSPITAL_COMMUNITY): Payer: Self-pay

## 2021-09-26 ENCOUNTER — Other Ambulatory Visit (HOSPITAL_COMMUNITY): Payer: Self-pay

## 2021-09-26 ENCOUNTER — Other Ambulatory Visit: Payer: Self-pay | Admitting: Family Medicine

## 2021-09-26 MED ORDER — GABAPENTIN 300 MG PO CAPS
300.0000 mg | ORAL_CAPSULE | Freq: Three times a day (TID) | ORAL | 0 refills | Status: DC
  Filled 2021-09-26: qty 90, 30d supply, fill #0

## 2021-09-28 ENCOUNTER — Ambulatory Visit: Payer: Medicare Other | Admitting: Orthopaedic Surgery

## 2021-10-04 ENCOUNTER — Other Ambulatory Visit (HOSPITAL_COMMUNITY): Payer: Self-pay

## 2021-10-05 ENCOUNTER — Ambulatory Visit (INDEPENDENT_AMBULATORY_CARE_PROVIDER_SITE_OTHER): Payer: Medicare Other

## 2021-10-05 ENCOUNTER — Encounter: Payer: Self-pay | Admitting: Orthopaedic Surgery

## 2021-10-05 ENCOUNTER — Ambulatory Visit (INDEPENDENT_AMBULATORY_CARE_PROVIDER_SITE_OTHER): Payer: Medicare Other | Admitting: Orthopaedic Surgery

## 2021-10-05 VITALS — BP 132/80 | HR 85

## 2021-10-05 DIAGNOSIS — S32010D Wedge compression fracture of first lumbar vertebra, subsequent encounter for fracture with routine healing: Secondary | ICD-10-CM

## 2021-10-05 DIAGNOSIS — M4624 Osteomyelitis of vertebra, thoracic region: Secondary | ICD-10-CM | POA: Diagnosis not present

## 2021-10-05 NOTE — Progress Notes (Signed)
Office Visit Note   Patient: Jeffrey [name]           Date of Birth: Oct 13, 1950           MRN: [id number] Visit Date: 10/05/2021              Requested by: Carollee Leitz, MD [address] [place],  [place] [id number] PCP: Carollee Leitz, MD   Assessment & Plan: Visit Diagnoses:  1. Compression fracture of L1 vertebra with routine healing, subsequent encounter   2. Osteomyelitis of thoracic spine (HCC)     Plan: He can follow-up on an as-needed basis. X-ray results reviewed.  Follow-Up Instructions: No follow-ups on file.   Orders:  Orders Placed This Encounter  Procedures   XR Lumbar Spine 2-3 Views   No orders of the defined types were placed in this encounter.     Procedures: No procedures performed   Clinical Data: No additional findings.   Subjective: Chief Complaint  Patient presents with   Lower Back - Follow-up    HPI follow-up osteomyelitis which required hospitalization in 2 months IV antibiotics.  States he is not having any pain and is back to normal activity he avoids heavy lifting denies numbness or tingling or weakness in his legs.  He is off antibiotics.  Review of Systems negative for fever chills no bowel bladder associated symptoms.  History of partial colectomy.   Objective: Vital Signs: BP 132/80 (BP Location: Left Arm, Patient Position: Sitting, Cuff Size: Normal)   Pulse 85   SpO2 96%   Physical Exam Constitutional:      Appearance: He is well-developed.  HENT:     Head: Normocephalic and atraumatic.     Right Ear: External ear normal.     Left Ear: External ear normal.  Eyes:     Pupils: Pupils are equal, round, and reactive to light.  Neck:     Thyroid: No thyromegaly.     Trachea: No tracheal deviation.  Cardiovascular:     Rate and Rhythm: Normal rate.  Pulmonary:     Effort: Pulmonary effort is normal.     Breath sounds: No wheezing.  Abdominal:     General: Bowel sounds are normal.     Palpations: Abdomen is soft.   Musculoskeletal:     Cervical back: Neck supple.  Skin:    General: Skin is warm and dry.     Capillary Refill: Capillary refill takes less than 2 seconds.  Neurological:     Mental Status: He is alert and oriented to person, place, and time.  Psychiatric:        Behavior: Behavior normal.        Thought Content: Thought content normal.        Judgment: Judgment normal.     Ortho Exam normal gait pattern.  No joint effusion.  Moves from sitting standing heel toe gait is normal.  Specialty Comments:  No specialty comments available.  Imaging: No results found.   PMFS History: Patient Active Problem List   Diagnosis Date Noted   Hospital discharge follow-up 04/17/2021   Healthcare maintenance 04/17/2021   Medication monitoring encounter 03/07/2021   Spinal stenosis    Malnutrition of moderate degree 02/02/2021   Uncontrolled pain    Acute bilateral low back pain with bilateral sciatica    Weakness generalized    Constipation    Neuropathic pain of both legs 01/24/2021   Osteomyelitis ([place]) 01/24/2021   Osteomyelitis of thoracic spine ([place]) 01/23/2021  Foot swelling 12/07/2020   Compression fracture of L1 lumbar vertebra (HCC) 12/05/2020   Cellulitis of left lower extremity 12/05/2020   Bilateral low back pain without sciatica 11/26/2020   Prostate cancer ([place]) 10/15/2020   Nocturia 05/14/2020   High risk heterosexual behavior 06/15/2019   Tobacco use 10/12/2018   Acid reflux 07/25/2018   Rash and nonspecific skin eruption 03/06/2018   Liver fibrosis 02/13/2018   Tubular adenoma 12/26/2016   Left hip pain 11/26/2016   History of partial colectomy 05/18/2016   Erectile dysfunction 05/18/2016   Episodic recurrent vertigo 01/20/2016   Chronic hepatitis C without hepatic coma ([place]) 06/23/2015   Essential hypertension 04/12/2015   Arthritis of knee 04/12/2015   Alcohol abuse 04/12/2015   Past Medical History:  Diagnosis Date   Arthritis    Chronic hepatitis C  ([place])    Hepatic cysts 12/01/2013   Seen on CT   Hypertension    Renal cysts 12/01/2013   Seen on CT    Family History  Problem Relation Age of Onset   Cancer Maternal Aunt     Past Surgical History:  Procedure Laterality Date   COLON SURGERY Right 09/2010   benign neoplasm of colon, s/p R colectomy   LYMPHADENECTOMY Bilateral 10/15/2020   Procedure: LYMPHADENECTOMY;  Surgeon: Alexis Frock, MD;  Location: [facility];  Service: Urology;  Laterality: Bilateral;   REPLACEMENT TOTAL KNEE Left 07/06/2014   Left knee replacement   ROBOT ASSISTED LAPAROSCOPIC RADICAL PROSTATECTOMY N/A 10/15/2020   Procedure: XI ROBOTIC ASSISTED LAPAROSCOPIC RADICAL PROSTATECTOMY, INDOCYANINE GREEN DYE INJECTION AND ADHESIOLYSIS;  Surgeon: Alexis Frock, MD;  Location: [facility];  Service: Urology;  Laterality: N/A;  3 HRS   Social History   Occupational History   Not on file  Tobacco Use   Smoking status: Every Day    Types: Cigarettes   Smokeless tobacco: Never  Vaping Use   Vaping Use: Never used  Substance and Sexual Activity   Alcohol use: Not Currently    Alcohol/week: 42.0 standard drinks of alcohol    Types: 42 Standard drinks or equivalent per week    Comment: 3-4 vodka drinks a day - previous alcoholic per patient   Drug use: No   Sexual activity: Yes    Partners: Female

## 2021-11-21 ENCOUNTER — Other Ambulatory Visit (HOSPITAL_COMMUNITY): Payer: Self-pay

## 2021-12-19 NOTE — Progress Notes (Deleted)
  SUBJECTIVE:   CHIEF COMPLAINT / HPI:   F/u  HTN Meds: amlodipine 10, losartan 100 BP Readings from Last 3 Encounters:  10/05/21 132/80  08/23/21 135/80  08/03/21 126/80      Alcohol Abuse    PERTINENT  PMH / PSH: HTN, liver fibrosis    OBJECTIVE:  There were no vitals taken for this visit.  General: NAD, pleasant, able to participate in exam Cardiac: RRR, no murmurs auscultated. Respiratory: CTAB, normal effort, no wheezes, rales or rhonchi Abdomen: soft, non-tender, non-distended, normoactive bowel sounds Extremities: warm and well perfused, no edema or cyanosis. Skin: warm and dry, no rashes noted Neuro: alert, no obvious focal deficits, speech normal Psych: Normal affect and mood  ASSESSMENT/PLAN:  No problem-specific Assessment & Plan notes found for this encounter.   No orders of the defined types were placed in this encounter.  No orders of the defined types were placed in this encounter.  No follow-ups on file. '@SIGNNOTE'$ @ {    This will disappear when note is signed, click to select method of visit    :1}

## 2021-12-20 ENCOUNTER — Ambulatory Visit: Payer: Medicare Other | Admitting: Student

## 2021-12-27 ENCOUNTER — Other Ambulatory Visit: Payer: Self-pay | Admitting: Student

## 2021-12-27 ENCOUNTER — Other Ambulatory Visit (HOSPITAL_COMMUNITY): Payer: Self-pay

## 2021-12-27 MED ORDER — LOSARTAN POTASSIUM 100 MG PO TABS
100.0000 mg | ORAL_TABLET | Freq: Every morning | ORAL | 0 refills | Status: DC
  Filled 2021-12-27: qty 100, 100d supply, fill #0

## 2021-12-27 NOTE — Telephone Encounter (Signed)
Patient came in stating that he needs a refill on his Losartan. He is completely out.

## 2021-12-29 ENCOUNTER — Encounter: Payer: Self-pay | Admitting: Surgery

## 2021-12-29 ENCOUNTER — Ambulatory Visit (INDEPENDENT_AMBULATORY_CARE_PROVIDER_SITE_OTHER): Payer: Medicare Other | Admitting: Surgery

## 2021-12-29 VITALS — BP 123/88 | HR 94 | Ht 68.0 in | Wt 152.2 lb

## 2021-12-29 DIAGNOSIS — S32010A Wedge compression fracture of first lumbar vertebra, initial encounter for closed fracture: Secondary | ICD-10-CM

## 2021-12-29 DIAGNOSIS — Z8739 Personal history of other diseases of the musculoskeletal system and connective tissue: Secondary | ICD-10-CM | POA: Diagnosis not present

## 2021-12-29 DIAGNOSIS — M5416 Radiculopathy, lumbar region: Secondary | ICD-10-CM | POA: Diagnosis not present

## 2021-12-29 NOTE — Progress Notes (Signed)
Office Visit Note   Patient: Jeffrey [name]           Date of Birth: April 01, 1951           MRN: [id number] Visit Date: 12/29/2021              Requested by: Holley Bouche, MD [address] [place],  [place] [address] PCP: Holley Bouche, MD   Assessment & Plan: Visit Diagnoses:  1. Compression fracture of L1 vertebra, initial encounter (HCC)   2. Radiculopathy, lumbar region   3. Hx of discitis     Plan: The patient's back pain and history of discitis I will repeat his lumbar MRI.  Follow-up with Dr. Lorin Mercy in a couple weeks for recheck to discuss results and further treatment options.  I advised patient that if his pain worsens before he returns back to the clinic that he should go immediately to the emergency room for evaluation.  Follow-Up Instructions: Return in about 2 weeks (around 01/12/2022) for with dr yates to review lumbar mri scan.   Orders:  Orders Placed This Encounter  Procedures   MR Lumbar Spine W Wo Contrast   No orders of the defined types were placed in this encounter.     Procedures: No procedures performed   Clinical Data: No additional findings.   Subjective: Chief Complaint  Patient presents with   Lower Back - Pain    HPI 71 year old black male comes in today with complaints of ongoing and worsening low back pain and right lower extremity radiculopathy.  Patient last seen by Dr. Lorin Mercy for his back June 2023.  I reviewed patient's chart he has a history of T12-L1 discitis/osteomyelitis that was seen on MRI February 22, 2021.  Patient also had a L1 compression fracture.  States that his back pain and right leg symptoms have been worse over the last several days.  Pain with walking, bending, twisting. Review of Systems No current complaints of cardiopulmonary GI/GU issues  Objective: Vital Signs: BP 123/88   Pulse 94   Ht '5\' 8"'$  (1.727 m)   Wt 152 lb 3.2 oz (69 kg)   BMI 23.14 kg/m   Physical Exam HENT:     Head: Normocephalic and  atraumatic.  Pulmonary:     Effort: No respiratory distress.  Musculoskeletal:     Comments: Gait is somewhat antalgic.  Bilateral lumbar paraspinal tenderness.  Negative log roll bilateral hips.  Positive right straight leg raise.  No focal deficits.  Neurological:     Mental Status: He is alert.     Ortho Exam  Specialty Comments:  No specialty comments available.  Imaging: No results found.   PMFS History: Patient Active Problem List   Diagnosis Date Noted   Hospital discharge follow-up 04/17/2021   Healthcare maintenance 04/17/2021   Medication monitoring encounter 03/07/2021   Spinal stenosis    Malnutrition of moderate degree 02/02/2021   Uncontrolled pain    Acute bilateral low back pain with bilateral sciatica    Weakness generalized    Constipation    Neuropathic pain of both legs 01/24/2021   Osteomyelitis ([place]) 01/24/2021   Osteomyelitis of thoracic spine ([place]) 01/23/2021   Foot swelling 12/07/2020   Compression fracture of L1 lumbar vertebra ([place]) 12/05/2020   Cellulitis of left lower extremity 12/05/2020   Bilateral low back pain without sciatica 11/26/2020   Prostate cancer ([place]) 10/15/2020   Nocturia 05/14/2020   High risk heterosexual behavior 06/15/2019   Tobacco use 10/12/2018  Acid reflux 07/25/2018   Rash and nonspecific skin eruption 03/06/2018   Liver fibrosis 02/13/2018   Tubular adenoma 12/26/2016   Left hip pain 11/26/2016   History of partial colectomy 05/18/2016   Erectile dysfunction 05/18/2016   Episodic recurrent vertigo 01/20/2016   Chronic hepatitis C without hepatic coma ([place]) 06/23/2015   Essential hypertension 04/12/2015   Arthritis of knee 04/12/2015   Alcohol abuse 04/12/2015   Past Medical History:  Diagnosis Date   Arthritis    Chronic hepatitis C ([place])    Hepatic cysts 12/01/2013   Seen on CT   Hypertension    Renal cysts 12/01/2013   Seen on CT    Family History  Problem Relation Age of Onset   Cancer  Maternal Aunt     Past Surgical History:  Procedure Laterality Date   COLON SURGERY Right 09/2010   benign neoplasm of colon, s/p R colectomy   LYMPHADENECTOMY Bilateral 10/15/2020   Procedure: LYMPHADENECTOMY;  Surgeon: Alexis Frock, MD;  Location: [facility];  Service: Urology;  Laterality: Bilateral;   REPLACEMENT TOTAL KNEE Left 07/06/2014   Left knee replacement   ROBOT ASSISTED LAPAROSCOPIC RADICAL PROSTATECTOMY N/A 10/15/2020   Procedure: XI ROBOTIC ASSISTED LAPAROSCOPIC RADICAL PROSTATECTOMY, INDOCYANINE GREEN DYE INJECTION AND ADHESIOLYSIS;  Surgeon: Alexis Frock, MD;  Location: [facility];  Service: Urology;  Laterality: N/A;  3 HRS   Social History   Occupational History   Not on file  Tobacco Use   Smoking status: Every Day    Types: Cigarettes   Smokeless tobacco: Never  Vaping Use   Vaping Use: Never used  Substance and Sexual Activity   Alcohol use: Not Currently    Alcohol/week: 42.0 standard drinks of alcohol    Types: 42 Standard drinks or equivalent per week    Comment: 3-4 vodka drinks a day - previous alcoholic per patient   Drug use: No   Sexual activity: Yes    Partners: Female

## 2021-12-30 ENCOUNTER — Ambulatory Visit
Admission: RE | Admit: 2021-12-30 | Discharge: 2021-12-30 | Disposition: A | Discharge status: Home / Self Care | Payer: Medicare Other | Source: Ambulatory Visit | Attending: Surgery | Admitting: Surgery

## 2021-12-30 DIAGNOSIS — Z8739 Personal history of other diseases of the musculoskeletal system and connective tissue: Secondary | ICD-10-CM

## 2021-12-30 DIAGNOSIS — S32010A Wedge compression fracture of first lumbar vertebra, initial encounter for closed fracture: Secondary | ICD-10-CM

## 2021-12-30 DIAGNOSIS — M5416 Radiculopathy, lumbar region: Secondary | ICD-10-CM

## 2021-12-30 MED ORDER — GADOBENATE DIMEGLUMINE 529 MG/ML IV SOLN
14.0000 mL | Freq: Once | INTRAVENOUS | Status: AC | PRN
  Administered 2021-12-30: 14 mL via INTRAVENOUS

## 2022-01-02 ENCOUNTER — Other Ambulatory Visit (HOSPITAL_COMMUNITY): Payer: Self-pay

## 2022-01-03 ENCOUNTER — Ambulatory Visit: Payer: Medicare Other | Admitting: Student

## 2022-01-03 NOTE — Progress Notes (Deleted)
  SUBJECTIVE:   CHIEF COMPLAINT / HPI:   F/u  HTN Meds: Losartan 100 mg, Amlodipine 10 mg Compliance BP Readings from Last 3 Encounters:  12/29/21 123/88  10/05/21 132/80  08/23/21 135/80     PERTINENT  PMH / PSH: ***  Past Medical History:  Diagnosis Date   Arthritis    Chronic hepatitis C ([place])    Hepatic cysts 12/01/2013   Seen on CT   Hypertension    Renal cysts 12/01/2013   Seen on CT    Past Surgical History:  Procedure Laterality Date   COLON SURGERY Right 09/2010   benign neoplasm of colon, s/p R colectomy   LYMPHADENECTOMY Bilateral 10/15/2020   Procedure: LYMPHADENECTOMY;  Surgeon: Alexis Frock, MD;  Location: [facility];  Service: Urology;  Laterality: Bilateral;   REPLACEMENT TOTAL KNEE Left 07/06/2014   Left knee replacement   ROBOT ASSISTED LAPAROSCOPIC RADICAL PROSTATECTOMY N/A 10/15/2020   Procedure: XI ROBOTIC ASSISTED LAPAROSCOPIC RADICAL PROSTATECTOMY, INDOCYANINE GREEN DYE INJECTION AND ADHESIOLYSIS;  Surgeon: Alexis Frock, MD;  Location: [facility];  Service: Urology;  Laterality: N/A;  3 HRS    OBJECTIVE:  There were no vitals taken for this visit.  General: NAD, pleasant, able to participate in exam Cardiac: RRR, no murmurs auscultated. Respiratory: CTAB, normal effort, no wheezes, rales or rhonchi Abdomen: soft, non-tender, non-distended, normoactive bowel sounds Extremities: warm and well perfused, no edema or cyanosis. Skin: warm and dry, no rashes noted Neuro: alert, no obvious focal deficits, speech normal Psych: Normal affect and mood  ASSESSMENT/PLAN:  No problem-specific Assessment & Plan notes found for this encounter.   No orders of the defined types were placed in this encounter.  No orders of the defined types were placed in this encounter.  No follow-ups on file. '@SIGNNOTE'$ @ {    This will disappear when note is signed, click to select method of visit    :1}

## 2022-01-04 ENCOUNTER — Encounter: Payer: Self-pay | Admitting: Orthopaedic Surgery

## 2022-01-04 ENCOUNTER — Other Ambulatory Visit (HOSPITAL_COMMUNITY): Payer: Self-pay

## 2022-01-04 ENCOUNTER — Ambulatory Visit: Payer: Medicare Other | Admitting: Orthopaedic Surgery

## 2022-01-04 VITALS — BP 138/88 | HR 74 | Ht 68.0 in | Wt 147.2 lb

## 2022-01-04 DIAGNOSIS — M5416 Radiculopathy, lumbar region: Secondary | ICD-10-CM | POA: Diagnosis not present

## 2022-01-04 DIAGNOSIS — M4624 Osteomyelitis of vertebra, thoracic region: Secondary | ICD-10-CM | POA: Diagnosis not present

## 2022-01-04 DIAGNOSIS — S32010D Wedge compression fracture of first lumbar vertebra, subsequent encounter for fracture with routine healing: Secondary | ICD-10-CM

## 2022-01-04 MED ORDER — GABAPENTIN 300 MG PO CAPS
300.0000 mg | ORAL_CAPSULE | Freq: Three times a day (TID) | ORAL | 2 refills | Status: DC
  Filled 2022-01-04: qty 90, 30d supply, fill #0

## 2022-01-04 NOTE — Progress Notes (Signed)
Office Visit Note   Patient: Jeffrey [name]           Date of Birth: 12-29-1950           MRN: [id number] Visit Date: 01/04/2022              Requested by: Holley Bouche, MD [address] [place],  [place] [address] PCP: Holley Bouche, MD   Assessment & Plan: Visit Diagnoses:  1. Radiculopathy, lumbar region   2. Compression fracture of L1 vertebra with routine healing, subsequent encounter   3. Osteomyelitis of thoracic spine (HCC)     Plan: Posttreatment IV antibiotics osteomyelitis thoracic spine and lumbar spine.  No ongoing osteomyelitis by new MRI scan.  We refilled his Neurontin.  He can follow-up as needed.  Follow-Up Instructions: No follow-ups on file.   Orders:  Orders Placed This Encounter  Procedures   CBC with Differential   Sed Rate (ESR)   C-reactive protein   Meds ordered this encounter  Medications   gabapentin (NEURONTIN) 300 MG capsule    Sig: Take 1 capsule (300 mg total) by mouth 3 (three) times daily.    Dispense:  90 capsule    Refill:  2      Procedures: No procedures performed   Clinical Data: No additional findings.   Subjective: Chief Complaint  Patient presents with   Lower Back - Pain, Follow-up    MRI review    HPI 71 year old male returns he has been treated for discitis osteomyelitis T9-10 and T12-L1.  Patient does have some degenerative facet changes in his back.  He is off antibiotics.  There was possibly mild residual paraspinal inflammation at T9-T10 no definite osteo changes.  Patient was receiving antibiotics by infectious disease team.  Patient states the Neurontin was helpful for symptoms and would like to refill. Review of Systems all systems updated unchanged from when he was in the hospital October 2022.   Objective: Vital Signs: BP 138/88   Pulse 74   Ht _0  (1.727 m)   Wt 147 lb 3.2 oz (66.8 kg)   BMI 22.38 kg/m   Physical Exam Constitutional:      Appearance: He is well-developed.  HENT:      Head: Normocephalic and atraumatic.     Right Ear: External ear normal.     Left Ear: External ear normal.  Eyes:     Pupils: Pupils are equal, round, and reactive to light.  Neck:     Thyroid: No thyromegaly.     Trachea: No tracheal deviation.  Cardiovascular:     Rate and Rhythm: Normal rate.  Pulmonary:     Effort: Pulmonary effort is normal.     Breath sounds: No wheezing.  Abdominal:     General: Bowel sounds are normal.     Palpations: Abdomen is soft.  Musculoskeletal:     Cervical back: Neck supple.  Skin:    General: Skin is warm and dry.     Capillary Refill: Capillary refill takes less than 2 seconds.  Neurological:     Mental Status: He is alert and oriented to person, place, and time.  Psychiatric:        Behavior: Behavior normal.        Thought Content: Thought content normal.        Judgment: Judgment normal.     Ortho Exam patient has normal gait pattern.  No hip flexion weakness.  Slow deliberate gait without limping.  Specialty Comments:  No specialty comments available.  Imaging: Narrative & Impression  CLINICAL DATA:  Low back pain, infection suspected. Abnormal x-ray with right lower extremity radiculopathy. History of discitis.   EXAM: MRI LUMBAR SPINE WITHOUT AND WITH CONTRAST   TECHNIQUE: Multiplanar and multiecho pulse sequences of the lumbar spine were obtained without and with intravenous contrast.   CONTRAST:  45m MULTIHANCE GADOBENATE DIMEGLUMINE 529 MG/ML IV SOLN   COMPARISON:  Thoracolumbar MRI 02/22/2021. Lumbar spine radiographs 10/05/2021 and 04/19/2021. Abdominopelvic CT 09/30/2020.   FINDINGS: Segmentation: Conventional anatomy assumed, with the last open disc space designated L5-S1.Concordant with previous imaging.   Alignment:  Physiologic.   Vertebrae: Sagittal images extend superiorly to the mid T9 level. Sequela of previously demonstrated discitis and osteomyelitis are noted at the T9-10 and T12-L1 levels. T9-10  is incompletely imaged, although demonstrates prominent marrow T2 hyperintensity and enhancement. There is chronic loss of vertebral body height and osseous retropulsion at L1 with mild residual osseous enhancement of the T12 and L1 vertebral bodies. No significant residual intervening disc space hyperintensity. The T1 marrow changes at both levels have improved. No evidence of discitis or osteomyelitis at the additional levels. No evidence of acute fracture. Mild sacroiliac degenerative changes bilaterally.   Conus medullaris: Extends to the L1 level and appears normal.   Paraspinal and other soft tissues: No significant residual paraspinal abnormalities are identified in the lumbar region. There appears to be some paraspinal inflammation at T9-10, only partially imaged on the sagittal images.   Disc levels:   T9-10: Only partially imaged on the sagittal images. As above, sequela of chronic discitis and osteomyelitis with persistent prominent bone marrow edema and enhancement, as well as probable mild anterior paraspinal edema.   No significant disc space findings at T10-11 or T11-12.   T12-L1: Chronic findings of previously demonstrated discitis and osteomyelitis with persistent but improved prominent bone marrow edema and enhancement. No focal paraspinal or epidural fluid collection. Osseous retropulsion contributes to chronic mass effect on the thecal sac without cord compression or high-grade foraminal narrowing.   L1-2: Mild disc bulging. No evidence of discitis, spinal stenosis or nerve root encroachment.   L2-3: Mild disc bulging and facet hypertrophy. No evidence of discitis, spinal stenosis or nerve root encroachment.   L3-4: Mildly progressive annular disc bulging and endplate osteophyte formation asymmetric to the left. Moderate facet and ligamentous hypertrophy with mild periarticular enhancement, greater on the left. No joint effusion or erosive changes.  Mild triangulation of the thecal sac with mild lateral recess and foraminal narrowing bilaterally. No nerve root encroachment.   L4-5: Mild disc bulging, facet and ligamentous hypertrophy. Mild periarticular enhancement, greater on the left. No joint effusion or erosive changes. No disc herniation or spinal stenosis.   L5-S1: Disc height and hydration are maintained. Bilateral facet hypertrophy with periarticular enhancement bilaterally. No spinal stenosis or nerve root encroachment.   IMPRESSION: 1. Sequela of previously demonstrated discitis and osteomyelitis at both T9-10 and T12-L1. The T9-10 level is incompletely imaged. There is residual marrow T2 hyperintensity and enhancement at both levels, although the T1 signal hypointensity has improved. Possible mild residual paraspinal inflammation at T9-10. 2. Lower lumbar facet hypertrophy with periarticular soft tissue enhancement, but no significant joint effusions. 3. No definite signs of new infection within the lumbar spine. 4. Lumbar spondylosis as described without high-grade spinal stenosis or definite nerve root encroachment.     Electronically Signed   By: WRichardean SaleM.D.   On: 12/30/2021 15:22  Result History  MR Lumbar Spine W Wo Contrast (Order #[id number]) on 12/30/2021 - Order Result History Report   PMFS History: Patient Active Problem List   Diagnosis Date Noted   Hospital discharge follow-up 04/17/2021   Healthcare maintenance 04/17/2021   Medication monitoring encounter 03/07/2021   Spinal stenosis    Malnutrition of moderate degree 02/02/2021   Uncontrolled pain    Acute bilateral low back pain with bilateral sciatica    Weakness generalized    Constipation    Neuropathic pain of both legs 01/24/2021   Osteomyelitis ([facility]) 01/24/2021   Osteomyelitis of thoracic spine ([place]) 01/23/2021   Foot swelling 12/07/2020   Compression fracture of L1 lumbar vertebra ([place]) 12/05/2020   Cellulitis of left  lower extremity 12/05/2020   Bilateral low back pain without sciatica 11/26/2020   Prostate cancer ([facility]) 10/15/2020   Nocturia 05/14/2020   High risk heterosexual behavior 06/15/2019   Tobacco use 10/12/2018   Acid reflux 07/25/2018   Rash and nonspecific skin eruption 03/06/2018   Liver fibrosis 02/13/2018   Tubular adenoma 12/26/2016   Left hip pain 11/26/2016   History of partial colectomy 05/18/2016   Erectile dysfunction 05/18/2016   Episodic recurrent vertigo 01/20/2016   Chronic hepatitis C without hepatic coma ([place]) 06/23/2015   Essential hypertension 04/12/2015   Arthritis of knee 04/12/2015   Alcohol abuse 04/12/2015   Past Medical History:  Diagnosis Date   Arthritis    Chronic hepatitis C ([place])    Hepatic cysts 12/01/2013   Seen on CT   Hypertension    Renal cysts 12/01/2013   Seen on CT    Family History  Problem Relation Age of Onset   Cancer Maternal Aunt     Past Surgical History:  Procedure Laterality Date   COLON SURGERY Right 09/2010   benign neoplasm of colon, s/p R colectomy   LYMPHADENECTOMY Bilateral 10/15/2020   Procedure: LYMPHADENECTOMY;  Surgeon: Alexis Frock, MD;  Location: [facility];  Service: Urology;  Laterality: Bilateral;   REPLACEMENT TOTAL KNEE Left 07/06/2014   Left knee replacement   ROBOT ASSISTED LAPAROSCOPIC RADICAL PROSTATECTOMY N/A 10/15/2020   Procedure: XI ROBOTIC ASSISTED LAPAROSCOPIC RADICAL PROSTATECTOMY, INDOCYANINE GREEN DYE INJECTION AND ADHESIOLYSIS;  Surgeon: Alexis Frock, MD;  Location: [facility];  Service: Urology;  Laterality: N/A;  3 HRS   Social History   Occupational History   Not on file  Tobacco Use   Smoking status: Every Day    Types: Cigarettes   Smokeless tobacco: Never  Vaping Use   Vaping Use: Never used  Substance and Sexual Activity   Alcohol use: Not Currently    Alcohol/week: 42.0 standard drinks of alcohol    Types: 42 Standard drinks or equivalent per week    Comment: 3-4 vodka drinks a day  - previous alcoholic per patient   Drug use: No   Sexual activity: Yes    Partners: Female

## 2022-01-05 ENCOUNTER — Other Ambulatory Visit: Payer: Self-pay | Admitting: Orthopaedic Surgery

## 2022-01-05 LAB — CBC WITH DIFFERENTIAL/PLATELET
Absolute Monocytes: 701 cells/uL (ref 200–950)
Basophils Absolute: 31 cells/uL (ref 0–200)
Basophils Relative: 0.5 %
Eosinophils Absolute: 50 cells/uL (ref 15–500)
Eosinophils Relative: 0.8 %
HCT: 40.7 % (ref 38.5–50.0)
Hemoglobin: 13.9 g/dL (ref 13.2–17.1)
Lymphs Abs: 1116 cells/uL (ref 850–3900)
MCH: 35.4 pg — ABNORMAL HIGH (ref 27.0–33.0)
MCHC: 34.2 g/dL (ref 32.0–36.0)
MCV: 103.6 fL — ABNORMAL HIGH (ref 80.0–100.0)
MPV: 11.1 fL (ref 7.5–12.5)
Monocytes Relative: 11.3 %
Neutro Abs: 4303 cells/uL (ref 1500–7800)
Neutrophils Relative %: 69.4 %
Platelets: 268 10*3/uL (ref 140–400)
RBC: 3.93 10*6/uL — ABNORMAL LOW (ref 4.20–5.80)
RDW: 12.9 % (ref 11.0–15.0)
Total Lymphocyte: 18 %
WBC: 6.2 10*3/uL (ref 3.8–10.8)

## 2022-01-05 LAB — HOUSE ACCOUNT TRACKING

## 2022-01-05 LAB — SEDIMENTATION RATE: Sed Rate: 11 mm/h (ref 0–20)

## 2022-01-05 LAB — C-REACTIVE PROTEIN: CRP: 1.7 mg/L (ref <8.0)

## 2022-01-12 ENCOUNTER — Ambulatory Visit: Payer: Medicare Other | Admitting: Student

## 2022-01-12 NOTE — Progress Notes (Deleted)
    SUBJECTIVE:   CHIEF COMPLAINT / HPI:   ***  PERTINENT  PMH / PSH: ***  OBJECTIVE:   There were no vitals taken for this visit.  ***  ASSESSMENT/PLAN:   No problem-specific Assessment & Plan notes found for this encounter.     Leslie Dales, MD [place]

## 2022-02-09 ENCOUNTER — Ambulatory Visit (INDEPENDENT_AMBULATORY_CARE_PROVIDER_SITE_OTHER): Payer: Medicare Other | Admitting: Student

## 2022-02-09 ENCOUNTER — Other Ambulatory Visit (HOSPITAL_COMMUNITY): Payer: Self-pay

## 2022-02-09 ENCOUNTER — Encounter: Payer: Self-pay | Admitting: Student

## 2022-02-09 DIAGNOSIS — G5793 Unspecified mononeuropathy of bilateral lower limbs: Secondary | ICD-10-CM

## 2022-02-09 DIAGNOSIS — N529 Male erectile dysfunction, unspecified: Secondary | ICD-10-CM | POA: Diagnosis not present

## 2022-02-09 DIAGNOSIS — I10 Essential (primary) hypertension: Secondary | ICD-10-CM

## 2022-02-09 DIAGNOSIS — Z23 Encounter for immunization: Secondary | ICD-10-CM

## 2022-02-09 MED ORDER — AMLODIPINE BESYLATE 10 MG PO TABS
10.0000 mg | ORAL_TABLET | Freq: Every day | ORAL | 5 refills | Status: AC
  Filled 2022-02-09: qty 30, 30d supply, fill #0
  Filled 2022-03-15: qty 30, 30d supply, fill #1
  Filled 2022-05-10: qty 30, 30d supply, fill #2
  Filled 2022-06-26: qty 30, 30d supply, fill #3
  Filled 2022-08-17: qty 30, 30d supply, fill #4

## 2022-02-09 MED ORDER — GABAPENTIN 300 MG PO CAPS
300.0000 mg | ORAL_CAPSULE | Freq: Three times a day (TID) | ORAL | 2 refills | Status: DC
  Filled 2022-02-09: qty 90, 30d supply, fill #0
  Filled 2022-03-15: qty 90, 30d supply, fill #1
  Filled 2022-08-17: qty 90, 30d supply, fill #2

## 2022-02-09 MED ORDER — SILDENAFIL CITRATE 100 MG PO TABS
50.0000 mg | ORAL_TABLET | Freq: Every day | ORAL | 0 refills | Status: AC | PRN
  Filled 2022-02-09 – 2022-02-22 (×2): qty 10, 10d supply, fill #0

## 2022-02-09 NOTE — Patient Instructions (Signed)
It was great to see you! Thank you for allowing me to participate in your care!  I recommend that you always bring your medications to each appointment as this makes it easy to ensure we are on the correct medications and helps [place] not miss when refills are needed.  Our plans for today:  - Refill meds  Amlodipine and gabapentin - Erectile Dysfunction  Take 1/2 pill of viagra, 30 min before sexual activity. (1 a day)  If not achieving desired result, may increase to 1 full pill  Take no more than 1 full pill of Viagra a day.  Seek medical care if you develop: -Lightheadedness/dizziness/ pass out/ loose consciousness -Develop chest pain or shortness of breath  -Make follow up appointment to discuss bladder leaking  Take care and seek immediate care sooner if you develop any concerns.   Dr. Holley Bouche, MD [place]

## 2022-02-09 NOTE — Progress Notes (Signed)
  SUBJECTIVE:   CHIEF COMPLAINT / HPI:   F/u and meet PCP  Sciatic pain in back Pain is well controlled with gabapentin. Needs refill.   Erectile dysfunction Had entire prostate removed and hasn't been able to have a firm erection since prostate removed.His surgeon told him he may have problems with his erections, but he is hoping to try viagra.    Urinary Leakage Leaking whenever he goes to the bathroom. Going to bathroom frequently  HM: [place]nus and Flu  HTN Meds: Amlodipine 10  BP Readings from Last 3 Encounters:  02/09/22 136/63  01/04/22 138/88  12/29/21 123/88    PERTINENT  PMH / PSH: HTN    OBJECTIVE:  BP 136/63   Pulse 93   Wt 148 lb 6.4 oz (67.3 kg)   SpO2 100%   BMI 22.56 kg/m  Physical Exam Constitutional:      Appearance: Normal appearance.  Cardiovascular:     Rate and Rhythm: Normal rate and regular rhythm.     Pulses: Normal pulses.     Heart sounds: Normal heart sounds. No murmur heard.    No friction rub. No gallop.  Pulmonary:     Effort: Pulmonary effort is normal.     Breath sounds: Normal breath sounds. No stridor. No wheezing or rhonchi.  Abdominal:     General: There is no distension.     Palpations: Abdomen is soft. There is no mass.     Tenderness: There is no abdominal tenderness. There is no guarding.     Hernia: No hernia is present.  Neurological:     Mental Status: He is alert.  Psychiatric:        Mood and Affect: Mood normal.        Behavior: Behavior normal.      ASSESSMENT/PLAN:  Essential hypertension Assessment & Plan: Patient BP at goal today. Patient requesting refill of meds. Denies any chest pain or SOB. -Continue Losartan 100, amlodipine 10   Erectile dysfunction, unspecified erectile dysfunction type Assessment & Plan: Patient complains of difficulty in strength of erection since prostate removed. Informed patient that this may not help, as the surgery can affect the nerves. Patient aware and still wanting  to try. -viagra 100 mg (start at 1/2 pill)   Neuropathic pain of both legs Assessment & Plan: Patient continues to have symptoms of neuropathic pain running down his legs, reports managed well with gabapentin. eGFR > 70 08/05/21. -Continue gabapentin 300 mg TID   Other orders -     Sildenafil Citrate; Take 0.5-1 tablets (50-100 mg total) by mouth daily as needed for erectile dysfunction. Start with half a pill 30 min before sexual activity. Only use one a day.  Dispense: 10 tablet; Refill: 0 -     amLODIPine Besylate; Take 1 tablet (10 mg total) by mouth daily.  Dispense: 30 tablet; Refill: 5 -     Gabapentin; Take 1 capsule (300 mg total) by mouth 3 (three) times daily.  Dispense: 90 capsule; Refill: 2 -     Tdap vaccine greater than or equal to 7yo IM   Patient received Tdap vaccine   Encouraged patient to make follow up visit to discuss other issues and for health maintenance.    No follow-ups on file. Holley Bouche, MD 02/11/2022, 5:49 PM PGY-2, [facility]

## 2022-02-10 ENCOUNTER — Other Ambulatory Visit (HOSPITAL_COMMUNITY): Payer: Self-pay

## 2022-02-11 NOTE — Assessment & Plan Note (Signed)
Patient continues to have symptoms of neuropathic pain running down his legs, reports managed well with gabapentin. eGFR > 70 08/05/21. -Continue gabapentin 300 mg TID

## 2022-02-11 NOTE — Assessment & Plan Note (Signed)
Patient BP at goal today. Patient requesting refill of meds. Denies any chest pain or SOB. -Continue Losartan 100, amlodipine 10

## 2022-02-11 NOTE — Assessment & Plan Note (Signed)
Patient complains of difficulty in strength of erection since prostate removed. Informed patient that this may not help, as the surgery can affect the nerves. Patient aware and still wanting to try. -viagra 100 mg (start at 1/2 pill)

## 2022-02-15 ENCOUNTER — Other Ambulatory Visit (HOSPITAL_COMMUNITY): Payer: Self-pay

## 2022-02-21 ENCOUNTER — Other Ambulatory Visit (HOSPITAL_COMMUNITY): Payer: Self-pay

## 2022-02-22 ENCOUNTER — Encounter: Payer: Self-pay | Admitting: Student

## 2022-02-22 ENCOUNTER — Telehealth: Payer: Self-pay | Admitting: Student

## 2022-02-22 ENCOUNTER — Other Ambulatory Visit (HOSPITAL_COMMUNITY): Payer: Self-pay

## 2022-02-22 ENCOUNTER — Ambulatory Visit (INDEPENDENT_AMBULATORY_CARE_PROVIDER_SITE_OTHER): Payer: Medicare Other | Admitting: Student

## 2022-02-22 VITALS — BP 124/70 | HR 89 | Wt 145.0 lb

## 2022-02-22 DIAGNOSIS — N3946 Mixed incontinence: Secondary | ICD-10-CM | POA: Diagnosis not present

## 2022-02-22 DIAGNOSIS — R399 Unspecified symptoms and signs involving the genitourinary system: Secondary | ICD-10-CM | POA: Diagnosis not present

## 2022-02-22 DIAGNOSIS — R32 Unspecified urinary incontinence: Secondary | ICD-10-CM | POA: Insufficient documentation

## 2022-02-22 LAB — POCT URINALYSIS DIP (MANUAL ENTRY)
Bilirubin, UA: NEGATIVE
Glucose, UA: NEGATIVE mg/dL
Ketones, POC UA: NEGATIVE mg/dL
Nitrite, UA: NEGATIVE
Spec Grav, UA: 1.015 (ref 1.010–1.025)
Urobilinogen, UA: 0.2 E.U./dL
pH, UA: 5.5 (ref 5.0–8.0)

## 2022-02-22 LAB — POCT UA - MICROSCOPIC ONLY

## 2022-02-22 MED ORDER — DULOXETINE HCL 20 MG PO CPEP
40.0000 mg | ORAL_CAPSULE | Freq: Every day | ORAL | 1 refills | Status: DC
  Filled 2022-02-22: qty 30, 22d supply, fill #0

## 2022-02-22 NOTE — Telephone Encounter (Signed)
Called to inform patient that his urine study showed no signs of UTI. No concerns or anything needed to follow up.

## 2022-02-22 NOTE — Progress Notes (Signed)
  SUBJECTIVE:   CHIEF COMPLAINT / HPI:   Urinary leakage with hx of proctectomy for prostate cancer 10/15/20. Been leaking since surgery. Has to go to the bathroom, before he actually has to go. Has the urge to go, has to change clothes at night, because he will leak when he passes gas. Same with coughing or laughing, anything increasing pressure. Denies any pain or dysuria. Using depends if going outside. Has been doing kegel exercises as recommend by his urologist last time he saw him.   PERTINENT  PMH / PSH: Urinary incontinence, Prostate surgery.     OBJECTIVE:  BP 124/70   Pulse 89   Wt 145 lb (65.8 kg)   SpO2 100%   BMI 22.05 kg/m  Physical Exam Abdominal:     General: Abdomen is flat. Bowel sounds are normal. There is no distension or abdominal bruit. There are no signs of injury.     Palpations: Abdomen is soft. There is no shifting dullness, fluid wave, hepatomegaly, splenomegaly, mass or pulsatile mass.     Tenderness: There is no abdominal tenderness.      ASSESSMENT/PLAN:  UTI symptoms -     POCT urinalysis dipstick -     POCT UA - Microscopic Only  Mixed stress and urge urinary incontinence Assessment & Plan: Patient continues to have urinary incontinence following his prostate surgery. Patient has symptoms when he coughs or when he needs to go uriante. Patients picture presents with mixed urge and stress incontinence. Patient denies any systemic symptoms or dysuria. Patient stated that urologist had mentioned that this may be an issues, he is not wanting to f/u with urology at this time. Will trial duloxetine to see if it helps with symptoms. If duloxetine not working, told patient to message provider for trial of mirbegron 25 daily. -Duloxetine 40 mg daily (start at 20 mg for 2 wk) -Consider Mirabegron if no relief with duloxetine -Consider Urologist f/u  Orders: -     DULoxetine HCl; Take 2 capsules (40 mg total) by mouth daily. Take 1 capsule daily for 2 weeks,  than can increase to 2 capsules daily.  Dispense: 30 capsule; Refill: 1   No follow-ups on file. Holley Bouche, MD 02/22/2022, 12:24 PM PGY-2, [place]

## 2022-02-22 NOTE — Assessment & Plan Note (Addendum)
Patient continues to have urinary incontinence following his prostate surgery. Patient has symptoms when he coughs or when he needs to go uriante. Patients picture presents with mixed urge and stress incontinence. Patient denies any systemic symptoms or dysuria. Patient stated that urologist had mentioned that this may be an issues, he is not wanting to f/u with urology at this time. Will trial duloxetine to see if it helps with symptoms. If duloxetine not working, told patient to message provider for trial of mirbegron 25 daily. -Duloxetine 40 mg daily (start at 20 mg for 2 wk) -Consider Mirabegron if no relief with duloxetine -Consider Urologist f/u

## 2022-02-22 NOTE — Patient Instructions (Signed)
It was great to see you! Thank you for allowing me to participate in your care!  I recommend that you always bring your medications to each appointment as this makes it easy to ensure we are on the correct medications and helps [place] not miss when refills are needed.  Our plans for today:  - Duloxetine  Take 20 mg, 1 capsule, daily for 2 weeks, then increase to 40 mg, 2 capsule daily  This may increase your BP, keep an eye on it! - Consider following up with Urologist  Take care and seek immediate care sooner if you develop any concerns.   Dr. Holley Bouche, MD [place]

## 2022-03-01 ENCOUNTER — Telehealth: Payer: Self-pay | Admitting: Student

## 2022-03-01 ENCOUNTER — Other Ambulatory Visit (HOSPITAL_COMMUNITY): Payer: Self-pay

## 2022-03-01 NOTE — Telephone Encounter (Signed)
Patient want doctor to call so he can give an update on the prescribed medication. Please call [phone]

## 2022-03-03 ENCOUNTER — Telehealth: Payer: Self-pay | Admitting: Student

## 2022-03-03 NOTE — Telephone Encounter (Signed)
Returned call to  patient who wanted to let me know that duloxetine pills were working to help with his urinary leakage.   Will continue to use the pills.

## 2022-03-08 ENCOUNTER — Ambulatory Visit (INDEPENDENT_AMBULATORY_CARE_PROVIDER_SITE_OTHER): Payer: Medicare Other | Admitting: Student

## 2022-03-08 ENCOUNTER — Other Ambulatory Visit (HOSPITAL_COMMUNITY): Payer: Self-pay

## 2022-03-08 ENCOUNTER — Other Ambulatory Visit: Payer: Self-pay

## 2022-03-08 ENCOUNTER — Encounter: Payer: Self-pay | Admitting: Student

## 2022-03-08 VITALS — BP 133/83 | HR 92 | Wt 149.0 lb

## 2022-03-08 DIAGNOSIS — N5234 Erectile dysfunction following simple prostatectomy: Secondary | ICD-10-CM

## 2022-03-08 DIAGNOSIS — F1010 Alcohol abuse, uncomplicated: Secondary | ICD-10-CM

## 2022-03-08 DIAGNOSIS — Z720 Tobacco use: Secondary | ICD-10-CM | POA: Diagnosis not present

## 2022-03-08 DIAGNOSIS — B182 Chronic viral hepatitis C: Secondary | ICD-10-CM | POA: Diagnosis not present

## 2022-03-08 DIAGNOSIS — N3946 Mixed incontinence: Secondary | ICD-10-CM | POA: Diagnosis not present

## 2022-03-08 MED ORDER — DULOXETINE HCL 20 MG PO CPEP
40.0000 mg | ORAL_CAPSULE | Freq: Every day | ORAL | 1 refills | Status: DC
  Filled 2022-03-08: qty 30, 15d supply, fill #0
  Filled 2022-03-15: qty 30, 20d supply, fill #0
  Filled 2022-04-18: qty 30, 20d supply, fill #1

## 2022-03-08 NOTE — Patient Instructions (Signed)
It was great to see you! Thank you for allowing me to participate in your care!  We saw you today for a follow up visit / wellness visit  Our plans for today:  - Continue Duloxetine for Urinary leakage - CT scan of chest for lung cancer screening - Make follow up appointment to discuss alcohol, smoking and depression   Take care and seek immediate care sooner if you develop any concerns.   Dr. Holley Bouche, MD [place]

## 2022-03-08 NOTE — Progress Notes (Signed)
SUBJECTIVE:   Chief compliant/HPI: annual examination  Jeffrey [name] is a 71 y.o. who presents today for an annual exam.   Med Rec  Urinary Incontinence  Duloxetine is helping, requesting a refill.   Chronic Hep C Was cleared by infectious disease, said that Hep C would linger, but he doesn't have an active infection.   Errectile Dysfunction Reports pills are not working. Got an erection but it was not firm.  Alcoholism Drinks socially, drinks beer, will binge drink and is unsure about how many drinks he has, but its a lot. Notes he will drink a 12 pack of beer in a setting. Notes that he wants to stop drinking, but isn't ready to stop yet. Is worried about his social life while sober. Also notes family history of alcoholism  Tobacco Abuse Patient reports he used to smoke when he was younger, for years, and then stopped for some time, and has picked it back up for the last 5 years. Patient wanting CT chest for lung cancer screening. Is considering stopping, but appreciates it's a big part of his social life he will miss. Smokes about a pack a week.   OBJECTIVE:   BP 133/83   Pulse 92   Wt 149 lb (67.6 kg)   SpO2 100%   BMI 22.66 kg/m   Physical Exam Constitutional:      General: He is not in acute distress.    Appearance: Normal appearance. He is not ill-appearing.  Cardiovascular:     Rate and Rhythm: Normal rate and regular rhythm.     Pulses: Normal pulses.     Heart sounds: Normal heart sounds. No murmur heard.    No friction rub. No gallop.  Pulmonary:     Effort: Pulmonary effort is normal. No respiratory distress.     Breath sounds: Normal breath sounds. No stridor. No wheezing, rhonchi or rales.  Chest:     Chest wall: No tenderness.  Abdominal:     General: Abdomen is flat. There is no distension.     Palpations: Abdomen is soft. There is no mass.     Tenderness: There is no abdominal tenderness. There is no guarding or rebound.     Hernia: No hernia  is present.  Skin:    Capillary Refill: Capillary refill takes less than 2 seconds.  Neurological:     Mental Status: He is alert.      ASSESSMENT/PLAN:   Urinary incontinence Patient reports symptoms have improved with duloxetine. -Continue Duloxetine  -F/u prn   Erectile dysfunction Reports Viagra is not helping. Patient get's erections, but they are not firm. This is likely 2/2 his prostate surgery, and likely nothing we can do to treat this. -Continue to monitor  Chronic hepatitis C without hepatic coma ([place]) Patient reports he was cleared by Infection Disease and is no longer with active infection.   Alcohol abuse Patient reports drinking 12 pack in a sitting and having multiple episodes of drinking in a week. He reports he's a social drinker. Explained to him the risk of drinking with Liver fibrosis, and risk of cirrhosis with level of drinking. Patient wanting to/considering stopping, but would like more time to think on it. Reports significant family history of alcoholism. Also reports so low mood/depression with holiday time that affect his drinking. Patient will make appointment to be seen for drinking/smoking -F/u prn  Tobacco use Patient  reports hx of smoking in youth, stopping for some time, and restarting for last 5  years. Patient not wanting to stop yet, but is considering. He worries about the effect on his social life. Smokes about a pack a week.  -Low Dose Screening CT    Annual Examination  See AVS for age appropriate recommendations.  PHQ score 0, reviewed and discussed.  Blood pressure value is 139/95 goal, discussed.   Considered the following screening exams based upon USPSTF recommendations: Diabetes screening: discussed Screening for elevated cholesterol: ordered HIV testing: discussed Hepatitis C: discussed Patient to f/u with Inf Disease Hepatitis B: discussed Syphilis if at high risk: discussed Colorectal cancer screening: up to date on  screening for CRC. Next Due 06/15/26 Lung cancer screening: discussed and is wanting testing.   Follow up in 1 year or sooner if indicated.    Holley Bouche, MD [place]

## 2022-03-10 NOTE — Assessment & Plan Note (Signed)
Patient reports drinking 12 pack in a sitting and having multiple episodes of drinking in a week. He reports he's a social drinker. Explained to him the risk of drinking with Liver fibrosis, and risk of cirrhosis with level of drinking. Patient wanting to/considering stopping, but would like more time to think on it. Reports significant family history of alcoholism. Also reports so low mood/depression with holiday time that affect his drinking. Patient will make appointment to be seen for drinking/smoking -F/u prn

## 2022-03-10 NOTE — Assessment & Plan Note (Signed)
Reports Viagra is not helping. Patient get's erections, but they are not firm. This is likely 2/2 his prostate surgery, and likely nothing we can do to treat this. -Continue to monitor

## 2022-03-10 NOTE — Assessment & Plan Note (Signed)
Patient reports he was cleared by Infection Disease and is no longer with active infection.

## 2022-03-10 NOTE — Assessment & Plan Note (Signed)
Patient  reports hx of smoking in youth, stopping for some time, and restarting for last 5 years. Patient not wanting to stop yet, but is considering. He worries about the effect on his social life. Smokes about a pack a week.  -Low Dose Screening CT

## 2022-03-10 NOTE — Assessment & Plan Note (Signed)
Patient reports symptoms have improved with duloxetine. -Continue Duloxetine  -F/u prn

## 2022-03-15 ENCOUNTER — Other Ambulatory Visit (HOSPITAL_COMMUNITY): Payer: Self-pay

## 2022-03-18 ENCOUNTER — Ambulatory Visit (HOSPITAL_COMMUNITY): Payer: Medicare Other

## 2022-04-18 ENCOUNTER — Other Ambulatory Visit: Payer: Self-pay

## 2022-04-18 ENCOUNTER — Other Ambulatory Visit: Payer: Self-pay | Admitting: Student

## 2022-04-18 ENCOUNTER — Other Ambulatory Visit (HOSPITAL_COMMUNITY): Payer: Self-pay

## 2022-04-19 ENCOUNTER — Other Ambulatory Visit (HOSPITAL_COMMUNITY): Payer: Self-pay

## 2022-04-19 MED ORDER — MECLIZINE HCL 25 MG PO TABS
25.0000 mg | ORAL_TABLET | Freq: Every day | ORAL | 0 refills | Status: AC | PRN
  Filled 2022-04-19: qty 30, 30d supply, fill #0

## 2022-04-21 ENCOUNTER — Other Ambulatory Visit (HOSPITAL_COMMUNITY): Payer: Self-pay

## 2022-04-21 ENCOUNTER — Ambulatory Visit (INDEPENDENT_AMBULATORY_CARE_PROVIDER_SITE_OTHER): Payer: Medicare Other | Admitting: Student

## 2022-04-21 ENCOUNTER — Encounter: Payer: Self-pay | Admitting: Student

## 2022-04-21 VITALS — BP 125/80 | HR 86 | Ht 68.0 in | Wt 149.8 lb

## 2022-04-21 DIAGNOSIS — R21 Rash and other nonspecific skin eruption: Secondary | ICD-10-CM

## 2022-04-21 DIAGNOSIS — R591 Generalized enlarged lymph nodes: Secondary | ICD-10-CM

## 2022-04-21 MED ORDER — HYDROCORTISONE 1 % EX OINT
1.0000 | TOPICAL_OINTMENT | Freq: Two times a day (BID) | CUTANEOUS | 0 refills | Status: AC
  Filled 2022-04-21: qty 30, 10d supply, fill #0

## 2022-04-21 NOTE — Progress Notes (Signed)
  SUBJECTIVE:   CHIEF COMPLAINT / HPI:   Lump on neck/Rash: Showed up about 3 weeks ago. Itches a lot, and tries not to scratch and keep moisturized. Notes father had thyroid cancer. Does not think it's getting bigger. Hadn't noticed rash on neck, did appreciate that it was itching, had put no products on the area.    PERTINENT  PMH / PSH:     OBJECTIVE:  BP 125/80   Pulse 86   Ht '5\' 8"'$  (1.727 m)   Wt 149 lb 12.8 oz (67.9 kg)   SpO2 100%   BMI 22.78 kg/m  Physical Exam Neck:     Trachea: Trachea and phonation normal.     Comments: Lichenified, poorly circumscribed, pruitic rash noted on side of neck, no erythema, overlying 2 mm mobile, well circumscribed, non-fluctuant mass, located in left anterior chain of neck. 2 mm mobile, well circumscribed, non-fluctuant, mass located in right anterior chain.  Musculoskeletal:     Cervical back: Normal range of motion. No edema or erythema.     Comments: 4 mm, mobile, well circumscribed, non-fluctuant mass noted in right groin  Lymphadenopathy:     Cervical: Cervical adenopathy present.     Right cervical: Superficial cervical adenopathy present.     Left cervical: Superficial cervical adenopathy present.      ASSESSMENT/PLAN:  Lymphadenopathy Assessment & Plan: Patient presents with concern for bump located on neck. Bump located in anterior chain of neck and most concerning for lymph node given physical exam. Patient with 2 mm mass x 2, located on either side of neck in anterior chain. Patient has 3rd mass, 4 mm wide, located in right groin. Patient with overlying rash on left side of neck. Lymph nodes thought to be reactive, and low concern for lymphoma/malignant process.  -Continue to monitor   Rash of neck Assessment & Plan: Patient comes in w/ rash on neck, that he was unaware of. He notes that rash itches. Rash with poorly defined edges, lichenification, and no erythema, although skin appears dry. Most concerting for some form  of dermatitis. Low concern for psoriasis, given area and lack of flaking, does not appear to be eczema for lack of patchiness appearance.  -Hydrocortisone 1% ointment BID -F/u prn   Other orders -     Hydrocortisone; Apply 1 Application topically 2 (two) times daily.  Dispense: 30 g; Refill: 0   No follow-ups on file. Holley Bouche, MD 04/23/2022, 7:11 PM PGY-2, [place]

## 2022-04-21 NOTE — Patient Instructions (Signed)
It was great to see you! Thank you for allowing me to participate in your care!  It looks like this bump in your neck may be from a lymph node. We will continue to watch it. For the rash on your neck we will give you a cream to use twice a day  Our plans for today:  - Bump on neck  Likely a lymph node. Will continue to watch  Make follow up to be seen after a month  IF get's larger/having night sweats/fevers/weight lost, come be seen sooner  - Rash on neck  Use hydrocortisone ointment twice a day, after cleaning area   Take care and seek immediate care sooner if you develop any concerns.   Dr. Holley Bouche, MD [place]

## 2022-04-23 DIAGNOSIS — R21 Rash and other nonspecific skin eruption: Secondary | ICD-10-CM | POA: Insufficient documentation

## 2022-04-23 DIAGNOSIS — R591 Generalized enlarged lymph nodes: Secondary | ICD-10-CM | POA: Insufficient documentation

## 2022-04-23 NOTE — Assessment & Plan Note (Addendum)
Patient comes in w/ rash on neck, that he was unaware of. He notes that rash itches. Rash with poorly defined edges, lichenification, and no erythema, although skin appears dry. Most concerting for some form of dermatitis. Low concern for psoriasis, given area and lack of flaking, does not appear to be eczema for lack of patchiness appearance.  -Hydrocortisone 1% ointment BID -F/u prn

## 2022-04-23 NOTE — Assessment & Plan Note (Signed)
Patient presents with concern for bump located on neck. Bump located in anterior chain of neck and most concerning for lymph node given physical exam. Patient with 2 mm mass x 2, located on either side of neck in anterior chain. Patient has 3rd mass, 4 mm wide, located in right groin. Patient with overlying rash on left side of neck. Lymph nodes thought to be reactive, and low concern for lymphoma/malignant process.  -Continue to monitor

## 2022-05-01 ENCOUNTER — Other Ambulatory Visit (HOSPITAL_COMMUNITY): Payer: Self-pay

## 2022-05-10 ENCOUNTER — Other Ambulatory Visit: Payer: Self-pay | Admitting: Student

## 2022-05-10 ENCOUNTER — Other Ambulatory Visit (HOSPITAL_COMMUNITY): Payer: Self-pay

## 2022-05-10 DIAGNOSIS — N3946 Mixed incontinence: Secondary | ICD-10-CM

## 2022-05-10 MED ORDER — DULOXETINE HCL 20 MG PO CPEP
40.0000 mg | ORAL_CAPSULE | Freq: Every day | ORAL | 1 refills | Status: DC
  Filled 2022-05-10: qty 30, 22d supply, fill #0
  Filled 2022-06-07: qty 30, 22d supply, fill #1

## 2022-05-11 ENCOUNTER — Other Ambulatory Visit: Payer: Self-pay

## 2022-05-11 ENCOUNTER — Other Ambulatory Visit (HOSPITAL_COMMUNITY): Payer: Self-pay

## 2022-06-07 ENCOUNTER — Other Ambulatory Visit (HOSPITAL_COMMUNITY): Payer: Self-pay

## 2022-06-26 ENCOUNTER — Other Ambulatory Visit (HOSPITAL_COMMUNITY): Payer: Self-pay

## 2022-06-26 ENCOUNTER — Other Ambulatory Visit: Payer: Self-pay | Admitting: Student

## 2022-06-26 DIAGNOSIS — N3946 Mixed incontinence: Secondary | ICD-10-CM

## 2022-06-26 MED ORDER — LOSARTAN POTASSIUM 100 MG PO TABS
100.0000 mg | ORAL_TABLET | Freq: Every morning | ORAL | 1 refills | Status: AC
  Filled 2022-06-26: qty 90, 90d supply, fill #0

## 2022-06-26 MED ORDER — DULOXETINE HCL 20 MG PO CPEP
40.0000 mg | ORAL_CAPSULE | Freq: Every day | ORAL | 2 refills | Status: AC
  Filled 2022-06-26: qty 90, 45d supply, fill #0
  Filled 2022-08-17: qty 90, 45d supply, fill #1
  Filled 2022-12-29: qty 90, 45d supply, fill #2

## 2022-06-27 ENCOUNTER — Other Ambulatory Visit (HOSPITAL_COMMUNITY): Payer: Self-pay

## 2022-08-17 ENCOUNTER — Other Ambulatory Visit (HOSPITAL_COMMUNITY): Payer: Self-pay

## 2022-08-31 ENCOUNTER — Telehealth: Payer: Self-pay | Admitting: Student

## 2022-08-31 NOTE — Telephone Encounter (Signed)
Contacted Jeffrey [name] to schedule their annual wellness visit. Patient declined to schedule AWV at this time.  Patient did AWV with insurance.  Patient said I can call him back next year to schedule AWV.  Thank you,  [facility] Direct dial  [phone]

## 2022-09-08 ENCOUNTER — Other Ambulatory Visit (HOSPITAL_COMMUNITY): Payer: Self-pay

## 2022-09-08 MED ORDER — AMLODIPINE BESYLATE 10 MG PO TABS
10.0000 mg | ORAL_TABLET | Freq: Every day | ORAL | 3 refills | Status: AC
  Filled 2022-09-08 – 2022-09-27 (×2): qty 90, 90d supply, fill #0
  Filled 2023-02-15: qty 90, 90d supply, fill #1

## 2022-09-08 MED ORDER — GABAPENTIN 300 MG PO CAPS
300.0000 mg | ORAL_CAPSULE | Freq: Three times a day (TID) | ORAL | 3 refills | Status: AC
  Filled 2022-09-08 – 2022-10-02 (×2): qty 270, 90d supply, fill #0
  Filled 2023-04-16: qty 270, 90d supply, fill #1
  Filled 2023-08-02: qty 270, 90d supply, fill #2

## 2022-09-08 MED ORDER — DULOXETINE HCL 20 MG PO CPEP
40.0000 mg | ORAL_CAPSULE | Freq: Every day | ORAL | 3 refills | Status: AC
  Filled 2022-09-08 – 2022-10-02 (×2): qty 180, 90d supply, fill #0
  Filled 2023-02-15: qty 180, 90d supply, fill #1
  Filled 2023-06-15 – 2023-08-02 (×2): qty 180, 90d supply, fill #2

## 2022-09-08 MED ORDER — MECLIZINE HCL 25 MG PO TABS
25.0000 mg | ORAL_TABLET | Freq: Every day | ORAL | 3 refills | Status: AC
  Filled 2022-09-08 – 2023-04-16 (×5): qty 90, 90d supply, fill #0

## 2022-09-08 MED ORDER — LOSARTAN POTASSIUM 100 MG PO TABS
100.0000 mg | ORAL_TABLET | Freq: Every day | ORAL | 3 refills | Status: AC
  Filled 2022-09-08 – 2022-10-02 (×2): qty 90, 90d supply, fill #0
  Filled 2022-12-29: qty 90, 90d supply, fill #1
  Filled 2023-04-06: qty 90, 90d supply, fill #2
  Filled 2023-04-16: qty 90, 90d supply, fill #0

## 2022-09-08 MED ORDER — FISH OIL 1200 MG PO CAPS: 1200.0000 mg | ORAL_CAPSULE | Freq: Every day | ORAL | 3 refills | Status: AC

## 2022-09-13 ENCOUNTER — Other Ambulatory Visit (HOSPITAL_COMMUNITY): Payer: Self-pay

## 2022-09-13 MED ORDER — VITAMIN D3 125 MCG (5000 UT) PO CAPS
ORAL_CAPSULE | Freq: Every day | ORAL | 0 refills | Status: AC
  Filled 2022-09-13: qty 40, 40d supply, fill #0

## 2022-09-20 ENCOUNTER — Other Ambulatory Visit (HOSPITAL_COMMUNITY): Payer: Self-pay

## 2022-09-27 ENCOUNTER — Other Ambulatory Visit (HOSPITAL_COMMUNITY): Payer: Self-pay

## 2022-10-02 ENCOUNTER — Other Ambulatory Visit (HOSPITAL_COMMUNITY): Payer: Self-pay

## 2022-10-03 ENCOUNTER — Other Ambulatory Visit (HOSPITAL_COMMUNITY): Payer: Self-pay

## 2022-11-13 ENCOUNTER — Ambulatory Visit: Payer: Medicare Other | Admitting: Student

## 2022-11-13 ENCOUNTER — Other Ambulatory Visit: Payer: Self-pay | Admitting: Family Medicine

## 2022-11-13 DIAGNOSIS — B181 Chronic viral hepatitis B without delta-agent: Secondary | ICD-10-CM

## 2022-11-13 NOTE — Progress Notes (Deleted)
  SUBJECTIVE:   CHIEF COMPLAINT / HPI:   Check Up:  HTN Meds: Losartan 100, amlodipine 10   Depression  EtOH  Tobacco -CT chest ordered  HM Colonoscopy due 05/2026  Hx of Prostate cancer Urology F/u?  STI Testing?  Hep C and Hep B  Abdomian ao [place]  PERTINENT  PMH / PSH: ***  Past Medical History:  Diagnosis Date   Arthritis    Chronic hepatitis C (HCC)    Hepatic cysts 12/01/2013   Seen on CT   Hypertension    Renal cysts 12/01/2013   Seen on CT    Patient Care Team: Bess Kinds, MD as PCP - General (Family Medicine) OBJECTIVE:  There were no vitals taken for this visit. Physical Exam   ASSESSMENT/PLAN:  There are no diagnoses linked to this encounter. No follow-ups on file. Bess Kinds, MD 11/13/2022, 7:43 AM PGY-***, [facility] {    This will disappear when note is signed, click to select method of visit    :1}

## 2022-11-17 ENCOUNTER — Other Ambulatory Visit (HOSPITAL_COMMUNITY): Payer: Self-pay

## 2022-11-17 MED ORDER — ALBUTEROL SULFATE HFA 108 (90 BASE) MCG/ACT IN AERS
2.0000 | INHALATION_SPRAY | Freq: Four times a day (QID) | RESPIRATORY_TRACT | 0 refills | Status: AC | PRN
  Filled 2022-11-17: qty 6.7, 25d supply, fill #0

## 2022-11-27 ENCOUNTER — Ambulatory Visit
Admission: RE | Admit: 2022-11-27 | Discharge: 2022-11-27 | Disposition: A | Discharge status: Home / Self Care | Payer: Medicare Other | Source: Ambulatory Visit | Attending: Family Medicine | Admitting: Family Medicine

## 2022-11-27 DIAGNOSIS — B181 Chronic viral hepatitis B without delta-agent: Secondary | ICD-10-CM

## 2022-11-29 ENCOUNTER — Other Ambulatory Visit (HOSPITAL_COMMUNITY): Payer: Self-pay

## 2022-12-29 ENCOUNTER — Other Ambulatory Visit (HOSPITAL_COMMUNITY): Payer: Self-pay

## 2023-01-09 ENCOUNTER — Other Ambulatory Visit (HOSPITAL_COMMUNITY): Payer: Self-pay

## 2023-01-09 MED ORDER — NA SULFATE-K SULFATE-MG SULF 17.5-3.13-1.6 GM/177ML PO SOLN
354.0000 mL | Freq: Once | ORAL | 0 refills | Status: AC
  Filled 2023-01-09: qty 354, 1d supply, fill #0
  Filled 2023-01-23: qty 354, 2d supply, fill #0

## 2023-01-19 ENCOUNTER — Other Ambulatory Visit (HOSPITAL_COMMUNITY): Payer: Self-pay

## 2023-01-23 ENCOUNTER — Other Ambulatory Visit (HOSPITAL_COMMUNITY): Payer: Self-pay

## 2023-02-15 ENCOUNTER — Other Ambulatory Visit: Payer: Self-pay

## 2023-02-15 ENCOUNTER — Other Ambulatory Visit (HOSPITAL_COMMUNITY): Payer: Self-pay

## 2023-02-23 ENCOUNTER — Other Ambulatory Visit (HOSPITAL_COMMUNITY): Payer: Self-pay

## 2023-04-06 ENCOUNTER — Other Ambulatory Visit (HOSPITAL_COMMUNITY): Payer: Self-pay

## 2023-04-09 ENCOUNTER — Other Ambulatory Visit (HOSPITAL_COMMUNITY): Payer: Self-pay

## 2023-04-16 ENCOUNTER — Other Ambulatory Visit: Payer: Self-pay

## 2023-04-16 ENCOUNTER — Other Ambulatory Visit (HOSPITAL_COMMUNITY): Payer: Self-pay

## 2023-06-15 ENCOUNTER — Other Ambulatory Visit (HOSPITAL_COMMUNITY): Payer: Self-pay

## 2023-06-27 ENCOUNTER — Other Ambulatory Visit (HOSPITAL_COMMUNITY): Payer: Self-pay

## 2023-08-02 ENCOUNTER — Other Ambulatory Visit (HOSPITAL_COMMUNITY): Payer: Self-pay

## 2023-10-29 ENCOUNTER — Ambulatory Visit (INDEPENDENT_AMBULATORY_CARE_PROVIDER_SITE_OTHER)

## 2023-10-29 ENCOUNTER — Encounter (HOSPITAL_COMMUNITY): Payer: Self-pay

## 2023-10-29 ENCOUNTER — Ambulatory Visit (HOSPITAL_COMMUNITY)
Admission: EM | Admit: 2023-10-29 | Discharge: 2023-10-29 | Disposition: A | Discharge status: Home / Self Care | Attending: Family Medicine | Admitting: Family Medicine

## 2023-10-29 ENCOUNTER — Other Ambulatory Visit (HOSPITAL_COMMUNITY): Payer: Self-pay

## 2023-10-29 DIAGNOSIS — M940 Chondrocostal junction syndrome [Tietze]: Secondary | ICD-10-CM | POA: Diagnosis not present

## 2023-10-29 DIAGNOSIS — R0789 Other chest pain: Secondary | ICD-10-CM | POA: Diagnosis not present

## 2023-10-29 MED ORDER — KETOROLAC TROMETHAMINE 30 MG/ML IJ SOLN
15.0000 mg | Freq: Once | INTRAMUSCULAR | Status: AC
  Administered 2023-10-29: 15 mg via INTRAMUSCULAR

## 2023-10-29 MED ORDER — KETOROLAC TROMETHAMINE 30 MG/ML IJ SOLN
INTRAMUSCULAR | Status: AC
  Filled 2023-10-29: qty 1

## 2023-10-29 MED ORDER — PREDNISONE 20 MG PO TABS
40.0000 mg | ORAL_TABLET | Freq: Every day | ORAL | 0 refills | Status: AC
  Filled 2023-10-29 – 2023-10-30 (×2): qty 10, 5d supply, fill #0

## 2023-10-29 NOTE — ED Triage Notes (Signed)
 Patient presents with Right-sided chest pain that started 3-days ago. Denies any vomiting,nausea or diarrhea. No trauma. Pain 8/10

## 2023-10-29 NOTE — ED Provider Notes (Signed)
 [facility]    CSN: [id number] Arrival date & time: 10/29/23  1528      History   Chief Complaint Chief Complaint  Patient presents with   Chest Pain    HPI Jeffrey [name] is a 73 y.o. male.    Chest Pain Here for right anterior chest pain that has been bothering him for about 3 days.  It hurts worse when he breathes deeply or when he touches the area that is painful.  No fever or cough.  No shortness of breath  NKDA  Last EGFR was 78 in 2023.  Past Medical History:  Diagnosis Date   Arthritis    Chronic hepatitis C (HCC)    Hepatic cysts 12/01/2013   Seen on CT   Hypertension    Renal cysts 12/01/2013   Seen on CT    Patient Active Problem List   Diagnosis Date Noted   Lymphadenopathy 04/23/2022   Rash of neck 04/23/2022   Urinary incontinence 02/22/2022   Spinal stenosis    Malnutrition of moderate degree 02/02/2021   Uncontrolled pain    Acute bilateral low back pain with bilateral sciatica    Weakness generalized    Constipation    Neuropathic pain of both legs 01/24/2021   Osteomyelitis (HCC) 01/24/2021   Osteomyelitis of thoracic spine (HCC) 01/23/2021   Compression fracture of L1 lumbar vertebra (HCC) 12/05/2020   Bilateral low back pain without sciatica 11/26/2020   Prostate cancer (HCC) 10/15/2020   Nocturia 05/14/2020   High risk heterosexual behavior 06/15/2019   Tobacco use 10/12/2018   Acid reflux 07/25/2018   Rash and nonspecific skin eruption 03/06/2018   Liver fibrosis 02/13/2018   Tubular adenoma 12/26/2016   Left hip pain 11/26/2016   History of partial colectomy 05/18/2016   Erectile dysfunction 05/18/2016   Episodic recurrent vertigo 01/20/2016   Chronic hepatitis C without hepatic coma (HCC) 06/23/2015   Essential hypertension 04/12/2015   Arthritis of knee 04/12/2015   Alcohol abuse 04/12/2015    Past Surgical History:  Procedure Laterality Date   COLON SURGERY Right 09/2010   benign neoplasm of colon, s/p R  colectomy   LYMPHADENECTOMY Bilateral 10/15/2020   Procedure: LYMPHADENECTOMY;  Surgeon: Alvaro Hummer, MD;  Location: [facility];  Service: Urology;  Laterality: Bilateral;   REPLACEMENT TOTAL KNEE Left 07/06/2014   Left knee replacement   ROBOT ASSISTED LAPAROSCOPIC RADICAL PROSTATECTOMY N/A 10/15/2020   Procedure: XI ROBOTIC ASSISTED LAPAROSCOPIC RADICAL PROSTATECTOMY, INDOCYANINE GREEN DYE INJECTION AND ADHESIOLYSIS;  Surgeon: Alvaro Hummer, MD;  Location: [facility];  Service: Urology;  Laterality: N/A;  3 HRS       Home Medications    Prior to Admission medications   Medication Sig Start Date End Date Taking? Authorizing Provider  amLODipine  (NORVASC ) 10 MG tablet Take 1 tablet (10 mg total) by mouth daily. 02/09/22  Yes Sowell, Penne, MD  buPROPion  (WELLBUTRIN ) 75 MG tablet Take 1 tablet (75 mg total) by mouth daily. 08/22/21  Yes Hope Merle, MD  cholecalciferol  (VITAMIN D ) 25 MCG (1000 UNIT) tablet Take 1,000 Units by mouth daily.   Yes [provider]  Cholecalciferol  (VITAMIN D3) 125 MCG (5000 UT) CAPS Take 1 capsule  by mouth daily, on monday-friday for 8 weeks. 09/13/22  Yes   DULoxetine  (CYMBALTA ) 20 MG capsule Take 1 capsule daily for 2 weeks, than can increase to 2 capsules daily. 06/26/22  Yes Sowell, Penne, MD  DULoxetine  (CYMBALTA ) 20 MG capsule Take 2 capsules (40 mg total) by  mouth daily for chronic back pain. 09/08/22  Yes   feeding supplement (ENSURE ENLIVE / ENSURE PLUS) LIQD Take 237 mLs by mouth 2 (two) times daily between meals. 02/28/21  Yes Paige, Victoria J, DO  furosemide  (LASIX ) 20 MG tablet Take 1 tablet (20 mg total) by mouth in the morning for edema 03/21/21  Yes   gabapentin  (NEURONTIN ) 300 MG capsule Take 1 capsule (300 mg total) by mouth 3 (three) times daily for nerve pain 09/08/22  Yes   hydrocortisone  1 % ointment Apply 1 Application topically 2 (two) times daily. 04/21/22  Yes Sowell, Penne, MD  losartan  (COZAAR ) 100 MG tablet Take 1 tablet (100  mg total) by mouth in the morning. 06/26/22  Yes Sowell, Penne, MD  meclizine  (ANTIVERT ) 25 MG tablet Take 1 tablet (25 mg total) by mouth every 24 hours as needed for dizziness 04/19/22  Yes Sowell, Penne, MD  meclizine  (DRAMAMINE II) 25 MG tablet Take 1 tablet (25 mg total) by mouth daily. 09/08/22  Yes   Omega-3 Fatty Acids (FISH OIL ) 1200 MG CAPS Take 1 capsule (1,200 mg total) by mouth daily for joint & heart health 09/08/22  Yes   polyethylene glycol powder (GLYCOLAX /MIRALAX ) 17 GM/SCOOP powder Take 17 g by mouth daily. 01/28/21  Yes Malvina Ellen, MD  potassium chloride  (KLOR-CON  M) 10 MEQ tablet Take 1 tablet (10 mEq total) by mouth daily. 03/21/21  Yes   predniSONE  (DELTASONE ) 20 MG tablet Take 2 tablets (40 mg total) by mouth daily with breakfast for 5 days. 10/29/23 11/03/23 Yes Trea Carnegie, Sharlet POUR, MD  sildenafil  (VIAGRA ) 100 MG tablet Take 0.5-1 tablets (50-100 mg total) by mouth daily as needed for erectile dysfunction. Start with half a pill 30 min before sexual activity. Only use one a day. 02/09/22  Yes Sowell, Penne, MD  albuterol  (VENTOLIN  HFA) 108 (90 Base) MCG/ACT inhaler Inhale 2 puffs into the lungs every 6 (six) hours as needed for wheezing/shortness of breath. 11/17/22     amLODipine  (NORVASC ) 10 MG tablet Take 1 tablet (10 mg total) by mouth daily for hypertension 09/08/22     losartan  (COZAAR ) 100 MG tablet Take 1 tablet (100 mg total) by mouth daily for hypertension 09/08/22     Zoster Vaccine Adjuvanted [facility]) injection Inject 0.5 mLs into the muscle as needed. 08/03/21   Simmons-Robinson, Rockie, MD    Family History Family History  Problem Relation Age of Onset   Cancer Maternal Aunt     Social History Social History   Tobacco Use   Smoking status: Every Day    Types: Cigarettes   Smokeless tobacco: Never  Vaping Use   Vaping status: Never Used  Substance Use Topics   Alcohol use: Not Currently    Alcohol/week: 42.0 standard drinks of alcohol    Types:  42 Standard drinks or equivalent per week    Comment: 3-4 vodka drinks a day - previous alcoholic per patient   Drug use: No     Allergies   Patient has no known allergies.   Review of Systems Review of Systems  Cardiovascular:  Positive for chest pain.     Physical Exam Triage Vital Signs ED Triage Vitals [10/29/23 1540]  Encounter Vitals Group     BP (!) 162/138     Girls Systolic BP Percentile      Girls Diastolic BP Percentile      Boys Systolic BP Percentile      Boys Diastolic BP Percentile      Pulse  Rate (!) 101     Resp 20     Temp 98 F (36.7 C)     Temp Source Oral     SpO2 95 %     Weight      Height      Head Circumference      Peak Flow      Pain Score      Pain Loc      Pain Education      Exclude from Growth Chart    No data found.  Updated Vital Signs BP (!) 162/138 (BP Location: Right Arm)   Pulse (!) 101   Temp 98 F (36.7 C) (Oral)   Resp 20   SpO2 95%   Visual Acuity Right Eye Distance:   Left Eye Distance:   Bilateral Distance:    Right Eye Near:   Left Eye Near:    Bilateral Near:     Physical Exam Vitals reviewed.  Constitutional:      General: He is not in acute distress.    Appearance: He is not toxic-appearing.  HENT:     Mouth/Throat:     Mouth: Mucous membranes are moist.     Pharynx: No oropharyngeal exudate or posterior oropharyngeal erythema.  Eyes:     Extraocular Movements: Extraocular movements intact.     Conjunctiva/sclera: Conjunctivae normal.     Pupils: Pupils are equal, round, and reactive to light.  Cardiovascular:     Rate and Rhythm: Normal rate and regular rhythm.     Heart sounds: No murmur heard. Pulmonary:     Effort: Pulmonary effort is normal. No respiratory distress.     Breath sounds: Normal breath sounds. No stridor. No wheezing, rhonchi or rales.     Comments: There is point tenderness in the right anterior chest at about the fourth rib in the midclavicular line.  There is no overlying  rash or induration or deformity.  Palpation there elicits the pain he has been experiencing. Musculoskeletal:     Cervical back: Neck supple.  Lymphadenopathy:     Cervical: No cervical adenopathy.  Skin:    Capillary Refill: Capillary refill takes less than 2 seconds.     Coloration: Skin is not jaundiced or pale.  Neurological:     General: No focal deficit present.     Mental Status: He is alert and oriented to person, place, and time.  Psychiatric:        Behavior: Behavior normal.      UC Treatments / Results  Labs (all labs ordered are listed, but only abnormal results are displayed) Labs Reviewed - No data to display  EKG   Radiology No results found.  Procedures Procedures (including critical care time)  Medications Ordered in UC Medications  ketorolac  (TORADOL ) 30 MG/ML injection 15 mg (has no administration in time range)    Initial Impression / Assessment and Plan / UC Course  I have reviewed the triage vital signs and the nursing notes.  Pertinent labs & imaging results that were available during my care of the patient were reviewed by me and considered in my medical decision making (see chart for details).     X-ray by my review is negative for any acute abnormality.  Toradol  15 mg is given here as an injection and 5 days of prednisone  are sent and is a burst for chest wall inflammation and costochondritis.  He has follow-up already set up with his primary care for later this month. Final  Clinical Impressions(s) / UC Diagnoses   Final diagnoses:  Atypical chest pain  Costochondritis     Discharge Instructions      The chest x-ray did not show any abnormality by my review.  The radiologist will also read your x-ray, and if their interpretation differs significantly from mine, and the management of your condition would change, we will call you.  You have been given a shot of Toradol  15 mg today.  Take prednisone  20 mg--2 daily for 5 days  You  can also take Tylenol  500 mg--2 tablets every 6 hours as needed for pain  I am glad you are going to follow-up with your primary care soon       ED Prescriptions     Medication Sig Dispense Auth. Provider   predniSONE  (DELTASONE ) 20 MG tablet Take 2 tablets (40 mg total) by mouth daily with breakfast for 5 days. 10 tablet Vonna Brodi Kari K, MD      PDMP not reviewed this encounter.   Vonna Sharlet POUR, MD 10/29/23 [phone]

## 2023-10-29 NOTE — ED Triage Notes (Signed)
 Patient reports a headache earlier today.

## 2023-10-29 NOTE — Discharge Instructions (Signed)
 The chest x-ray did not show any abnormality by my review.  The radiologist will also read your x-ray, and if their interpretation differs significantly from mine, and the management of your condition would change, we will call you.  You have been given a shot of Toradol  15 mg today.  Take prednisone  20 mg--2 daily for 5 days  You can also take Tylenol  500 mg--2 tablets every 6 hours as needed for pain  I am glad you are going to follow-up with your primary care soon

## 2023-10-30 ENCOUNTER — Other Ambulatory Visit (HOSPITAL_COMMUNITY): Payer: Self-pay

## 2023-10-30 ENCOUNTER — Other Ambulatory Visit: Payer: Self-pay

## 2023-11-06 ENCOUNTER — Other Ambulatory Visit (HOSPITAL_COMMUNITY): Payer: Self-pay

## 2023-11-06 MED ORDER — LOSARTAN POTASSIUM 100 MG PO TABS
100.0000 mg | ORAL_TABLET | Freq: Every day | ORAL | 3 refills | Status: AC
  Filled 2023-11-06 – 2024-05-23 (×4): qty 90, 90d supply, fill #0

## 2023-11-07 ENCOUNTER — Other Ambulatory Visit: Payer: Self-pay | Admitting: Family Medicine

## 2023-11-07 DIAGNOSIS — R748 Abnormal levels of other serum enzymes: Secondary | ICD-10-CM

## 2023-11-15 ENCOUNTER — Other Ambulatory Visit (HOSPITAL_COMMUNITY): Payer: Self-pay

## 2023-11-27 ENCOUNTER — Other Ambulatory Visit

## 2023-12-26 ENCOUNTER — Other Ambulatory Visit: Payer: Self-pay

## 2023-12-28 ENCOUNTER — Other Ambulatory Visit (HOSPITAL_COMMUNITY): Payer: Self-pay

## 2023-12-28 MED ORDER — HYDRALAZINE HCL 10 MG PO TABS
10.0000 mg | ORAL_TABLET | Freq: Two times a day (BID) | ORAL | 0 refills | Status: AC
  Filled 2023-12-28 – 2023-12-31 (×2): qty 14, 7d supply, fill #0

## 2023-12-31 ENCOUNTER — Other Ambulatory Visit

## 2023-12-31 ENCOUNTER — Other Ambulatory Visit: Payer: Self-pay

## 2024-01-01 ENCOUNTER — Other Ambulatory Visit (HOSPITAL_COMMUNITY): Payer: Self-pay

## 2024-01-02 ENCOUNTER — Other Ambulatory Visit (HOSPITAL_COMMUNITY): Payer: Self-pay

## 2024-01-02 MED ORDER — HYDRALAZINE HCL 25 MG PO TABS
25.0000 mg | ORAL_TABLET | Freq: Two times a day (BID) | ORAL | 3 refills | Status: AC
  Filled 2024-01-02 – 2024-05-23 (×4): qty 180, 90d supply, fill #0

## 2024-01-11 ENCOUNTER — Other Ambulatory Visit (HOSPITAL_COMMUNITY): Payer: Self-pay

## 2024-01-15 ENCOUNTER — Other Ambulatory Visit: Payer: Self-pay

## 2024-01-21 ENCOUNTER — Ambulatory Visit
Admission: RE | Admit: 2024-01-21 | Discharge: 2024-01-21 | Disposition: A | Discharge status: Home / Self Care | Source: Ambulatory Visit | Attending: Family Medicine | Admitting: Family Medicine

## 2024-01-21 DIAGNOSIS — R748 Abnormal levels of other serum enzymes: Secondary | ICD-10-CM

## 2024-01-30 ENCOUNTER — Emergency Department (HOSPITAL_COMMUNITY)

## 2024-01-30 ENCOUNTER — Encounter (HOSPITAL_COMMUNITY): Payer: Self-pay

## 2024-01-30 ENCOUNTER — Emergency Department (HOSPITAL_COMMUNITY)
Admission: EM | Admit: 2024-01-30 | Discharge: 2024-01-31 | Discharge status: Against Medical Advice | Attending: Emergency Medicine | Admitting: Emergency Medicine

## 2024-01-30 ENCOUNTER — Other Ambulatory Visit: Payer: Self-pay

## 2024-01-30 DIAGNOSIS — R2241 Localized swelling, mass and lump, right lower limb: Secondary | ICD-10-CM | POA: Diagnosis not present

## 2024-01-30 DIAGNOSIS — M79671 Pain in right foot: Secondary | ICD-10-CM | POA: Insufficient documentation

## 2024-01-30 DIAGNOSIS — Z5321 Procedure and treatment not carried out due to patient leaving prior to being seen by health care provider: Secondary | ICD-10-CM | POA: Insufficient documentation

## 2024-01-30 DIAGNOSIS — M25511 Pain in right shoulder: Secondary | ICD-10-CM | POA: Insufficient documentation

## 2024-01-30 DIAGNOSIS — W07XXXA Fall from chair, initial encounter: Secondary | ICD-10-CM | POA: Insufficient documentation

## 2024-01-30 LAB — BASIC METABOLIC PANEL WITH GFR
Anion gap: 13 (ref 5–15)
BUN: 9 mg/dL (ref 8–23)
CO2: 16 mmol/L — ABNORMAL LOW (ref 22–32)
Calcium: 9.6 mg/dL (ref 8.9–10.3)
Chloride: 101 mmol/L (ref 98–111)
Creatinine, Ser: 1.28 mg/dL — ABNORMAL HIGH (ref 0.61–1.24)
GFR, Estimated: 60 mL/min — ABNORMAL LOW (ref >60)
Glucose, Bld: 97 mg/dL (ref 70–99)
Potassium: 5.3 mmol/L — ABNORMAL HIGH (ref 3.5–5.1)
Sodium: 130 mmol/L — ABNORMAL LOW (ref 135–145)

## 2024-01-30 LAB — CBC WITH DIFFERENTIAL/PLATELET
Abs Immature Granulocytes: 0.03 K/uL (ref 0.00–0.07)
Basophils Absolute: 0 K/uL (ref 0.0–0.1)
Basophils Relative: 0 %
Eosinophils Absolute: 0 K/uL (ref 0.0–0.5)
Eosinophils Relative: 0 %
HCT: 35.2 % — ABNORMAL LOW (ref 39.0–52.0)
Hemoglobin: 12.1 g/dL — ABNORMAL LOW (ref 13.0–17.0)
Immature Granulocytes: 0 %
Lymphocytes Relative: 9 %
Lymphs Abs: 0.9 K/uL (ref 0.7–4.0)
MCH: 38.2 pg — ABNORMAL HIGH (ref 26.0–34.0)
MCHC: 34.4 g/dL (ref 30.0–36.0)
MCV: 111 fL — ABNORMAL HIGH (ref 80.0–100.0)
Monocytes Absolute: 1.1 K/uL — ABNORMAL HIGH (ref 0.1–1.0)
Monocytes Relative: 11 %
Neutro Abs: 8.1 K/uL — ABNORMAL HIGH (ref 1.7–7.7)
Neutrophils Relative %: 80 %
Platelets: 262 K/uL (ref 150–400)
RBC: 3.17 MIL/uL — ABNORMAL LOW (ref 4.22–5.81)
RDW: 13.8 % (ref 11.5–15.5)
WBC: 10.2 K/uL (ref 4.0–10.5)
nRBC: 0 % (ref 0.0–0.2)

## 2024-01-30 NOTE — ED Triage Notes (Signed)
 C/O right shoulder pain and right leg pain from falling out of chair a week ago. Denies SHOB and CP.

## 2024-01-30 NOTE — ED Provider Triage Note (Signed)
 Emergency Medicine Provider Triage Evaluation Note  Jeffrey [name] , a 73 y.o. male  was evaluated in triage.  Pt complains of right shoulder pain since a fall that occurred 1 week ago.  He states the pain started immediately.  No chest pain, or shortness of breath.  Also endorses pain and swelling to his right foot which started 3 days ago.  Denies history of gout.  No wounds to his foot on exam..  Review of Systems  Positive: As above Negative: As above  Physical Exam  BP (!) 173/87 (BP Location: Right Arm)   Pulse 76   Temp 98 F (36.7 C) (Oral)   Resp 19   Ht 5' 8 (1.727 m)   Wt 71.7 kg   SpO2 100%   BMI 24.02 kg/m  Gen:   Awake, no distress   Resp:  Normal effort  MSK:   Moves extremities without difficulty  Other:   Medical Decision Making  Medically screening exam initiated at 5:32 PM.  Appropriate orders placed.  Keeshawn Fakhouri was informed that the remainder of the evaluation will be completed by another provider, this initial triage assessment does not replace that evaluation, and the importance of remaining in the ED until their evaluation is complete.     Hildegard Loge, PA-C 01/30/24 1732

## 2024-01-31 NOTE — ED Notes (Signed)
 Pt left AMA

## 2024-02-04 ENCOUNTER — Ambulatory Visit (HOSPITAL_COMMUNITY): Admission: EM | Admit: 2024-02-04 | Discharge: 2024-02-04 | Disposition: A | Discharge status: Home / Self Care

## 2024-02-04 ENCOUNTER — Encounter (HOSPITAL_COMMUNITY): Payer: Self-pay

## 2024-02-04 DIAGNOSIS — M7989 Other specified soft tissue disorders: Secondary | ICD-10-CM

## 2024-02-04 DIAGNOSIS — M109 Gout, unspecified: Secondary | ICD-10-CM | POA: Diagnosis not present

## 2024-02-04 MED ORDER — PREDNISONE 10 MG (21) PO TBPK
ORAL_TABLET | ORAL | 0 refills | Status: AC
  Filled 2024-02-04: qty 21, 6d supply, fill #0

## 2024-02-04 NOTE — Discharge Instructions (Signed)
 We are going to schedule you for ultrasound of right lower extremity for tomorrow-this will check to see if there is a blood clot in your leg, if they see one they will advise you of where to go for further care  I believe your symptoms may be due to gout, will prescribed steroid dose pack for pain and swelling. Start tomorrow morning, if you start them tonight you may have difficulty sleeping.

## 2024-02-04 NOTE — ED Triage Notes (Signed)
 Patient has swelling and discoloration to the lower part of the right leg and right foot x 1 week. Patient went to the ED on 01/30/24, but left due to wait times. Patient denies SOB at this time.  Patient states he has been soaking his right foot in Epsom salt.

## 2024-02-05 ENCOUNTER — Other Ambulatory Visit (HOSPITAL_COMMUNITY): Payer: Self-pay

## 2024-02-05 NOTE — ED Provider Notes (Signed)
 [facility]    CSN: [id number] Arrival date & time: 02/04/24  1813      History   Chief Complaint Chief Complaint  Patient presents with   Foot Swelling    HPI Oral Remache is a 73 y.o. male.   Patient presents today due to 1 week worth of right ankle swelling that is worsening.  Patient was seen by his PCP at [facility] on 10/8 and advised to go to the ER due to concern for DVT.  Patient states that he  was not seen in the ED because the wait time was too long.  Patient denies a history of gout but does admit to indulging in red meat and shellfish prior to swelling in ankle.  Patient is able to bear weight on his right lower extremity, patient ranks pain 10/10.  Patient denies use of medication for pain.     Past Medical History:  Diagnosis Date   Arthritis    Chronic hepatitis C (HCC)    Hepatic cysts 12/01/2013   Seen on CT   Hypertension    Renal cysts 12/01/2013   Seen on CT    Patient Active Problem List   Diagnosis Date Noted   Lymphadenopathy 04/23/2022   Rash of neck 04/23/2022   Urinary incontinence 02/22/2022   Spinal stenosis    Malnutrition of moderate degree 02/02/2021   Uncontrolled pain    Acute bilateral low back pain with bilateral sciatica    Weakness generalized    Constipation    Neuropathic pain of both legs 01/24/2021   Osteomyelitis (HCC) 01/24/2021   Osteomyelitis of thoracic spine (HCC) 01/23/2021   Compression fracture of L1 lumbar vertebra (HCC) 12/05/2020   Bilateral low back pain without sciatica 11/26/2020   Prostate cancer (HCC) 10/15/2020   Nocturia 05/14/2020   High risk heterosexual behavior 06/15/2019   Tobacco use 10/12/2018   Acid reflux 07/25/2018   Rash and nonspecific skin eruption 03/06/2018   Liver fibrosis 02/13/2018   Tubular adenoma 12/26/2016   Left hip pain 11/26/2016   History of partial colectomy 05/18/2016   Erectile dysfunction 05/18/2016   Episodic recurrent vertigo 01/20/2016   Chronic  hepatitis C without hepatic coma (HCC) 06/23/2015   Essential hypertension 04/12/2015   Arthritis of knee 04/12/2015   Alcohol abuse 04/12/2015    Past Surgical History:  Procedure Laterality Date   COLON SURGERY Right 09/2010   benign neoplasm of colon, s/p R colectomy   LYMPHADENECTOMY Bilateral 10/15/2020   Procedure: LYMPHADENECTOMY;  Surgeon: Alvaro Hummer, MD;  Location: [facility];  Service: Urology;  Laterality: Bilateral;   REPLACEMENT TOTAL KNEE Left 07/06/2014   Left knee replacement   ROBOT ASSISTED LAPAROSCOPIC RADICAL PROSTATECTOMY N/A 10/15/2020   Procedure: XI ROBOTIC ASSISTED LAPAROSCOPIC RADICAL PROSTATECTOMY, INDOCYANINE GREEN DYE INJECTION AND ADHESIOLYSIS;  Surgeon: Alvaro Hummer, MD;  Location: [facility];  Service: Urology;  Laterality: N/A;  3 HRS       Home Medications    Prior to Admission medications   Medication Sig Start Date End Date Taking? Authorizing Provider  predniSONE  (STERAPRED UNI-PAK 21 TAB) 10 MG (21) TBPK tablet Take 6 tablets (60 mg total) by mouth daily for 1 day, THEN 5 tablets (50 mg total) daily for 1 day, THEN 4 tablets (40 mg total) daily for 1 day, THEN 3 tablets (30 mg total) daily for 1 day, THEN 2 tablets (20 mg total) daily for 1 day, THEN 1 tablet (10 mg total) daily for 1 day. Take  as directed on back of package. 02/04/24 02/11/24 Yes Andra Krabbe C, PA-C  albuterol  (VENTOLIN  HFA) 108 (90 Base) MCG/ACT inhaler Inhale 2 puffs into the lungs every 6 (six) hours as needed for wheezing/shortness of breath. Patient not taking: Reported on 02/04/2024 11/17/22     amLODipine  (NORVASC ) 10 MG tablet Take 1 tablet (10 mg total) by mouth daily. 02/09/22   Jennelle Riis, MD  amLODipine  (NORVASC ) 10 MG tablet Take 1 tablet (10 mg total) by mouth daily for hypertension 09/08/22     buPROPion  (WELLBUTRIN ) 75 MG tablet Take 1 tablet (75 mg total) by mouth daily. 08/22/21   Hope Merle, MD  cholecalciferol  (VITAMIN D ) 25 MCG (1000 UNIT) tablet Take  1,000 Units by mouth daily.    [provider]  Cholecalciferol  (VITAMIN D3) 125 MCG (5000 UT) CAPS Take 1 capsule  by mouth daily, on monday-friday for 8 weeks. 09/13/22     DULoxetine  (CYMBALTA ) 20 MG capsule Take 1 capsule daily for 2 weeks, than can increase to 2 capsules daily. Patient not taking: Reported on 02/04/2024 06/26/22   Jennelle Riis, MD  DULoxetine  (CYMBALTA ) 20 MG capsule Take 2 capsules (40 mg total) by mouth daily for chronic back pain. 09/08/22     feeding supplement (ENSURE ENLIVE / ENSURE PLUS) LIQD Take 237 mLs by mouth 2 (two) times daily between meals. 02/28/21   Paige, Victoria J, DO  furosemide  (LASIX ) 20 MG tablet Take 1 tablet (20 mg total) by mouth in the morning for edema Patient not taking: Reported on 02/04/2024 03/21/21     gabapentin  (NEURONTIN ) 300 MG capsule Take 1 capsule (300 mg total) by mouth 3 (three) times daily for nerve pain 09/08/22     hydrALAZINE  (APRESOLINE ) 10 MG tablet Take 1 tablet (10 mg total) by mouth 2 (two) times daily. 12/28/23     hydrALAZINE  (APRESOLINE ) 25 MG tablet Take 1 tablet (25 mg total) by mouth 2 (two) times daily. 01/02/24     hydrocortisone  1 % ointment Apply 1 Application topically 2 (two) times daily. Patient not taking: Reported on 02/04/2024 04/21/22   Jennelle Riis, MD  losartan  (COZAAR ) 100 MG tablet Take 1 tablet (100 mg total) by mouth in the morning. 06/26/22   Sowell, Brandon, MD  losartan  (COZAAR ) 100 MG tablet Take 1 tablet (100 mg total) by mouth daily for hypertension 09/08/22     losartan  (COZAAR ) 100 MG tablet Take 1 tablet (100 mg total) by mouth daily for hypertension. 11/06/23     meclizine  (ANTIVERT ) 25 MG tablet Take 1 tablet (25 mg total) by mouth every 24 hours as needed for dizziness 04/19/22   Jennelle Riis, MD  meclizine  (DRAMAMINE II) 25 MG tablet Take 1 tablet (25 mg total) by mouth daily. 09/08/22     Omega-3 Fatty Acids (FISH OIL ) 1200 MG CAPS Take 1 capsule (1,200 mg total) by mouth daily for  joint & heart health 09/08/22     polyethylene glycol powder (GLYCOLAX /MIRALAX ) 17 GM/SCOOP powder Take 17 g by mouth daily. 01/28/21   Malvina Ellen, MD  potassium chloride  (KLOR-CON  M) 10 MEQ tablet Take 1 tablet (10 mEq total) by mouth daily. 03/21/21     sildenafil  (VIAGRA ) 100 MG tablet Take 0.5-1 tablets (50-100 mg total) by mouth daily as needed for erectile dysfunction. Start with half a pill 30 min before sexual activity. Only use one a day. 02/09/22   Jennelle Riis, MD  Zoster Vaccine Adjuvanted [facility]) injection Inject 0.5 mLs into the muscle as needed. 08/03/21  Simmons-Robinson, Rockie, MD    Family History Family History  Problem Relation Age of Onset   Cancer Maternal Aunt     Social History Social History   Tobacco Use   Smoking status: Every Day    Types: Cigarettes   Smokeless tobacco: Never  Vaping Use   Vaping status: Never Used  Substance Use Topics   Alcohol use: Yes    Alcohol/week: 42.0 standard drinks of alcohol    Types: 42 Standard drinks or equivalent per week    Comment: 3-4 vodka drinks a day - previous alcoholic per patient   Drug use: No     Allergies   Patient has no known allergies.   Review of Systems Review of Systems   Physical Exam Triage Vital Signs ED Triage Vitals  Encounter Vitals Group     BP 02/04/24 1858 118/84     Girls Systolic BP Percentile --      Girls Diastolic BP Percentile --      Boys Systolic BP Percentile --      Boys Diastolic BP Percentile --      Pulse Rate 02/04/24 1858 91     Resp 02/04/24 1858 16     Temp 02/04/24 1858 98.1 F (36.7 C)     Temp Source 02/04/24 1858 Oral     SpO2 02/04/24 1858 94 %     Weight --      Height --      Head Circumference --      Peak Flow --      Pain Score 02/04/24 1857 8     Pain Loc --      Pain Education --      Exclude from Growth Chart --    No data found.  Updated Vital Signs BP 118/84 (BP Location: Right Arm)   Pulse 91   Temp 98.1 F (36.7 C)  (Oral)   Resp 16   SpO2 94%   Visual Acuity Right Eye Distance:   Left Eye Distance:   Bilateral Distance:    Right Eye Near:   Left Eye Near:    Bilateral Near:     Physical Exam Vitals and nursing note reviewed.  Constitutional:      General: He is not in acute distress.    Appearance: Normal appearance. He is not ill-appearing, toxic-appearing or diaphoretic.  Eyes:     General: No scleral icterus. Cardiovascular:     Rate and Rhythm: Normal rate and regular rhythm.     Heart sounds: Normal heart sounds.  Pulmonary:     Effort: Pulmonary effort is normal. No respiratory distress.     Breath sounds: Normal breath sounds. No wheezing or rhonchi.  Musculoskeletal:     Right lower leg: 2+ Pitting Edema present.     Right ankle: Swelling present. No deformity, ecchymosis or lacerations. Tenderness present over the lateral malleolus and medial malleolus. Decreased range of motion.     Comments:  Marked swelling of right ankle, 2+ pitting edema noted of lower leg  Skin:    General: Skin is warm.  Neurological:     Mental Status: He is alert and oriented to person, place, and time.  Psychiatric:        Mood and Affect: Mood normal.        Behavior: Behavior normal.      UC Treatments / Results  Labs (all labs ordered are listed, but only abnormal results are displayed) Labs Reviewed - No data to  display  EKG   Radiology No results found.  Procedures Procedures (including critical care time)  Medications Ordered in UC Medications - No data to display  Initial Impression / Assessment and Plan / UC Course  I have reviewed the triage vital signs and the nursing notes.  Pertinent labs & imaging results that were available during my care of the patient were reviewed by me and considered in my medical decision making (see chart for details).     Right ankle swelling-concern for gout versus DVT, suspicion for DVT is low however will order Doppler ultrasound and of  right lower extremity.  Prescribed prednisone  10 mg 6-day Dosepak to be started tomorrow for right ankle swelling. Final Clinical Impressions(s) / UC Diagnoses   Final diagnoses:  Swelling of right lower extremity  Acute gout of right ankle, unspecified cause     Discharge Instructions      We are going to schedule you for ultrasound of right lower extremity for tomorrow-this will check to see if there is a blood clot in your leg, if they see one they will advise you of where to go for further care  I believe your symptoms may be due to gout, will prescribed steroid dose pack for pain and swelling. Start tomorrow morning, if you start them tonight you may have difficulty sleeping.     ED Prescriptions     Medication Sig Dispense Auth. Provider   predniSONE  (STERAPRED UNI-PAK 21 TAB) 10 MG (21) TBPK tablet Take 6 tablets (60 mg total) by mouth daily for 1 day, THEN 5 tablets (50 mg total) daily for 1 day, THEN 4 tablets (40 mg total) daily for 1 day, THEN 3 tablets (30 mg total) daily for 1 day, THEN 2 tablets (20 mg total) daily for 1 day, THEN 1 tablet (10 mg total) daily for 1 day. Take as directed on back of package. 21 tablet Andra Corean BROCKS, PA-C      PDMP not reviewed this encounter.   Andra Corean BROCKS, PA-C 02/05/24 [phone]

## 2024-02-06 ENCOUNTER — Ambulatory Visit (HOSPITAL_COMMUNITY)
Admission: RE | Admit: 2024-02-06 | Discharge: 2024-02-06 | Disposition: A | Discharge status: Home / Self Care | Source: Ambulatory Visit

## 2024-02-06 DIAGNOSIS — M7989 Other specified soft tissue disorders: Secondary | ICD-10-CM | POA: Insufficient documentation

## 2024-02-06 DIAGNOSIS — R599 Enlarged lymph nodes, unspecified: Secondary | ICD-10-CM | POA: Insufficient documentation

## 2024-02-08 ENCOUNTER — Other Ambulatory Visit (HOSPITAL_COMMUNITY): Payer: Self-pay

## 2024-02-08 ENCOUNTER — Ambulatory Visit (HOSPITAL_COMMUNITY): Payer: Self-pay

## 2024-02-08 NOTE — Progress Notes (Signed)
 No DVT noted on US . Index of suspicion for DVT was low, treated as gout as well. Please follow-up with patient and see if his symptoms have improved with steroid dose pack. If they have not he needs to follow-up with ortho.

## 2024-02-11 ENCOUNTER — Other Ambulatory Visit (HOSPITAL_COMMUNITY): Payer: Self-pay

## 2024-02-11 MED ORDER — GABAPENTIN 300 MG PO CAPS
300.0000 mg | ORAL_CAPSULE | Freq: Three times a day (TID) | ORAL | 3 refills | Status: AC
  Filled 2024-02-11 – 2024-05-23 (×3): qty 270, 90d supply, fill #0

## 2024-02-15 ENCOUNTER — Other Ambulatory Visit (HOSPITAL_COMMUNITY): Payer: Self-pay

## 2024-02-15 ENCOUNTER — Other Ambulatory Visit: Payer: Self-pay

## 2024-02-15 ENCOUNTER — Encounter (HOSPITAL_COMMUNITY): Payer: Self-pay | Admitting: *Deleted

## 2024-02-15 ENCOUNTER — Ambulatory Visit (HOSPITAL_COMMUNITY): Admission: EM | Admit: 2024-02-15 | Discharge: 2024-02-15 | Disposition: A | Discharge status: Home / Self Care

## 2024-02-15 DIAGNOSIS — R600 Localized edema: Secondary | ICD-10-CM

## 2024-02-15 MED ORDER — FUROSEMIDE 20 MG PO TABS
20.0000 mg | ORAL_TABLET | Freq: Every morning | ORAL | 2 refills | Status: AC
  Filled 2024-02-15 – 2024-02-18 (×2): qty 30, 30d supply, fill #0
  Filled 2024-03-11 – 2024-03-12 (×2): qty 30, 30d supply, fill #1
  Filled 2024-05-08: qty 30, 30d supply, fill #0

## 2024-02-15 NOTE — ED Triage Notes (Signed)
 PT returns today because the swelling and pain is worse tha when he was last seen on 02/04/24. Pt finished the Prednisone  .

## 2024-02-15 NOTE — ED Provider Notes (Signed)
 [facility]  Note:  This document was prepared using Conservation officer, historic buildings and may include unintentional dictation errors.  MRN: [id number] DOB: [birth year]  Subjective:   Jeffrey [name] is a 73 y.o. male presenting for reevaluation of swelling and pain to right lower extremity x 2+ weeks.  Patient reports that he was placed on prednisone  last time he was here to treat right foot swelling however symptoms did not improve.  Patient and family were concerned for possible lower extremity DVT.  Patient has mild to moderate swelling, no redness, no warmth, no pain with palpation of the calf muscle consistent with DVT.  Patient was previously on Lasix  but states that it made him use the bathroom too much so he stopped taking it.  Patient does not know why he was taking medication originally.  Patient denies ever following up with a cardiologist for multiple blood pressure medications and cardiac function medications.  No current facility-administered medications for this encounter.  Current Outpatient Medications:    amLODipine  (NORVASC ) 10 MG tablet, Take 1 tablet (10 mg total) by mouth daily., Disp: 30 tablet, Rfl: 5   amLODipine  (NORVASC ) 10 MG tablet, Take 1 tablet (10 mg total) by mouth daily for hypertension, Disp: 90 tablet, Rfl: 3   buPROPion  (WELLBUTRIN ) 75 MG tablet, Take 1 tablet (75 mg total) by mouth daily., Disp: 30 tablet, Rfl: 0   cholecalciferol  (VITAMIN D ) 25 MCG (1000 UNIT) tablet, Take 1,000 Units by mouth daily., Disp: , Rfl:    Cholecalciferol  (VITAMIN D3) 125 MCG (5000 UT) CAPS, Take 1 capsule  by mouth daily, on monday-friday for 8 weeks., Disp: 40 capsule, Rfl: 0   DULoxetine  (CYMBALTA ) 20 MG capsule, Take 1 capsule daily for 2 weeks, than can increase to 2 capsules daily., Disp: 90 capsule, Rfl: 2   DULoxetine  (CYMBALTA ) 20 MG capsule, Take 2 capsules (40 mg total) by mouth daily for chronic back pain., Disp: 180 capsule, Rfl: 3   feeding supplement  (ENSURE ENLIVE / ENSURE PLUS) LIQD, Take 237 mLs by mouth 2 (two) times daily between meals., Disp: 237 mL, Rfl: 12   gabapentin  (NEURONTIN ) 300 MG capsule, Take 1 capsule (300 mg total) by mouth 3 (three) times daily for nerve pain, Disp: 270 capsule, Rfl: 3   gabapentin  (NEURONTIN ) 300 MG capsule, Take 1 capsule (300 mg total) by mouth 3 (three) times daily for nerve pain., Disp: 270 capsule, Rfl: 3   hydrALAZINE  (APRESOLINE ) 10 MG tablet, Take 1 tablet (10 mg total) by mouth 2 (two) times daily., Disp: 14 tablet, Rfl: 0   hydrALAZINE  (APRESOLINE ) 25 MG tablet, Take 1 tablet (25 mg total) by mouth 2 (two) times daily., Disp: 180 tablet, Rfl: 3   losartan  (COZAAR ) 100 MG tablet, Take 1 tablet (100 mg total) by mouth in the morning., Disp: 90 tablet, Rfl: 1   losartan  (COZAAR ) 100 MG tablet, Take 1 tablet (100 mg total) by mouth daily for hypertension, Disp: 90 tablet, Rfl: 3   losartan  (COZAAR ) 100 MG tablet, Take 1 tablet (100 mg total) by mouth daily for hypertension., Disp: 90 tablet, Rfl: 3   meclizine  (ANTIVERT ) 25 MG tablet, Take 1 tablet (25 mg total) by mouth every 24 hours as needed for dizziness, Disp: 30 tablet, Rfl: 0   meclizine  (DRAMAMINE II) 25 MG tablet, Take 1 tablet (25 mg total) by mouth daily., Disp: 90 tablet, Rfl: 3   Omega-3 Fatty Acids (FISH OIL ) 1200 MG CAPS, Take 1 capsule (1,200 mg total) by mouth  daily for joint & heart health, Disp: 90 capsule, Rfl: 3   polyethylene glycol powder (GLYCOLAX /MIRALAX ) 17 GM/SCOOP powder, Take 17 g by mouth daily., Disp: 238 g, Rfl: 0   potassium chloride  (KLOR-CON  M) 10 MEQ tablet, Take 1 tablet (10 mEq total) by mouth daily., Disp: 30 tablet, Rfl: 0   sildenafil  (VIAGRA ) 100 MG tablet, Take 0.5-1 tablets (50-100 mg total) by mouth daily as needed for erectile dysfunction. Start with half a pill 30 min before sexual activity. Only use one a day., Disp: 10 tablet, Rfl: 0   Zoster Vaccine Adjuvanted [facility]) injection, Inject 0.5 mLs into the  muscle as needed., Disp: 0.5 mL, Rfl: 1   albuterol  (VENTOLIN  HFA) 108 (90 Base) MCG/ACT inhaler, Inhale 2 puffs into the lungs every 6 (six) hours as needed for wheezing/shortness of breath. (Patient not taking: Reported on 02/04/2024), Disp: 6.7 g, Rfl: 0   furosemide  (LASIX ) 20 MG tablet, Take 1 tablet (20 mg total) by mouth in the morning for edema, Disp: 30 tablet, Rfl: 2   hydrocortisone  1 % ointment, Apply 1 Application topically 2 (two) times daily. (Patient not taking: Reported on 02/04/2024), Disp: 30 g, Rfl: 0   No Known Allergies  Past Medical History:  Diagnosis Date   Arthritis    Chronic hepatitis C (HCC)    Hepatic cysts 12/01/2013   Seen on CT   Hypertension    Renal cysts 12/01/2013   Seen on CT     Past Surgical History:  Procedure Laterality Date   COLON SURGERY Right 09/2010   benign neoplasm of colon, s/p R colectomy   LYMPHADENECTOMY Bilateral 10/15/2020   Procedure: LYMPHADENECTOMY;  Surgeon: Alvaro Hummer, MD;  Location: [facility];  Service: Urology;  Laterality: Bilateral;   REPLACEMENT TOTAL KNEE Left 07/06/2014   Left knee replacement   ROBOT ASSISTED LAPAROSCOPIC RADICAL PROSTATECTOMY N/A 10/15/2020   Procedure: XI ROBOTIC ASSISTED LAPAROSCOPIC RADICAL PROSTATECTOMY, INDOCYANINE GREEN DYE INJECTION AND ADHESIOLYSIS;  Surgeon: Alvaro Hummer, MD;  Location: [facility];  Service: Urology;  Laterality: N/A;  3 HRS    Family History  Problem Relation Age of Onset   Cancer Maternal Aunt     Social History   Tobacco Use   Smoking status: Every Day    Types: Cigarettes   Smokeless tobacco: Never  Vaping Use   Vaping status: Never Used  Substance Use Topics   Alcohol use: Yes    Alcohol/week: 42.0 standard drinks of alcohol    Types: 42 Standard drinks or equivalent per week    Comment: 3-4 vodka drinks a day - previous alcoholic per patient   Drug use: No    ROS Refer to HPI for ROS details.  Objective:    Vitals: BP (!) 146/95   Pulse 93    Temp 98.4 F (36.9 C)   Resp 20   SpO2 94%   Physical Exam Vitals and nursing note reviewed.  Constitutional:      General: He is not in acute distress.    Appearance: Normal appearance. He is well-developed. He is not ill-appearing or toxic-appearing.  HENT:     Head: Normocephalic.  Cardiovascular:     Rate and Rhythm: Normal rate and regular rhythm.     Heart sounds: No murmur heard. Pulmonary:     Effort: Pulmonary effort is normal. No respiratory distress.     Breath sounds: No stridor. No wheezing.  Chest:     Chest wall: No tenderness.  Musculoskeletal:  Right ankle: Swelling present. No deformity or ecchymosis. Tenderness present. Decreased range of motion.     Right foot: Decreased range of motion. Normal capillary refill. Swelling and tenderness present. No bony tenderness. Normal pulse.  Skin:    General: Skin is warm and dry.  Neurological:     General: No focal deficit present.     Mental Status: He is alert and oriented to person, place, and time.  Psychiatric:        Mood and Affect: Mood normal.        Behavior: Behavior normal.     Procedures  No results found for this or any previous visit (from the past 24 hours).  Assessment and Plan :     Discharge Instructions       1. Lower extremity edema (Primary) - furosemide  (LASIX ) 20 MG tablet; Take 1 tablet (20 mg total) by mouth in the morning for edema  Dispense: 30 tablet; Refill: 2 - Ambulatory referral to Cardiology for follow-up evaluation for lower extremity edema and multiple prescribed medication for blood pressure and cardiovascular function. - Follow-up with primary care provider at [facility] health discuss ongoing management with furosemide  should cardiology recommend further treatment. -Continue to monitor symptoms for any change in severity if there is any escalation of current symptoms or development of new symptoms follow-up in ER for further evaluation and management.       Chenay Nesmith B Odilia Damico   Kelaiah Escalona, [place] B, [place] 02/15/24 1859

## 2024-02-15 NOTE — ED Notes (Signed)
 Urinal given to the patient for home use due to limited mobility.

## 2024-02-15 NOTE — Discharge Instructions (Addendum)
  1. Lower extremity edema (Primary) - furosemide  (LASIX ) 20 MG tablet; Take 1 tablet (20 mg total) by mouth in the morning for edema  Dispense: 30 tablet; Refill: 2 - Ambulatory referral to Cardiology for follow-up evaluation for lower extremity edema and multiple prescribed medication for blood pressure and cardiovascular function. - Follow-up with primary care provider at [facility] health discuss ongoing management with furosemide  should cardiology recommend further treatment. -Continue to monitor symptoms for any change in severity if there is any escalation of current symptoms or development of new symptoms follow-up in ER for further evaluation and management.

## 2024-02-18 ENCOUNTER — Other Ambulatory Visit: Payer: Self-pay

## 2024-02-18 ENCOUNTER — Other Ambulatory Visit (HOSPITAL_COMMUNITY): Payer: Self-pay

## 2024-03-11 ENCOUNTER — Other Ambulatory Visit: Payer: Self-pay

## 2024-03-13 ENCOUNTER — Other Ambulatory Visit: Payer: Self-pay

## 2024-03-21 ENCOUNTER — Other Ambulatory Visit (HOSPITAL_COMMUNITY): Payer: Self-pay

## 2024-03-21 MED ORDER — POTASSIUM CHLORIDE ER 10 MEQ PO TBCR
10.0000 meq | EXTENDED_RELEASE_TABLET | Freq: Every day | ORAL | 0 refills | Status: AC
  Filled 2024-03-21: qty 90, 90d supply, fill #0

## 2024-04-01 ENCOUNTER — Other Ambulatory Visit (HOSPITAL_COMMUNITY): Payer: Self-pay

## 2024-04-22 ENCOUNTER — Other Ambulatory Visit (HOSPITAL_COMMUNITY): Payer: Self-pay

## 2024-04-22 MED ORDER — POTASSIUM CHLORIDE ER 10 MEQ PO TBCR
10.0000 meq | EXTENDED_RELEASE_TABLET | Freq: Every morning | ORAL | 0 refills | Status: AC
  Filled 2024-04-22: qty 90, 90d supply, fill #0

## 2024-04-22 MED ORDER — FUROSEMIDE 20 MG PO TABS
20.0000 mg | ORAL_TABLET | Freq: Every day | ORAL | 3 refills | Status: AC
  Filled 2024-04-22 – 2024-05-23 (×2): qty 90, 90d supply, fill #0

## 2024-05-05 ENCOUNTER — Other Ambulatory Visit (HOSPITAL_COMMUNITY): Payer: Self-pay

## 2024-05-08 ENCOUNTER — Other Ambulatory Visit: Payer: Self-pay

## 2024-05-08 ENCOUNTER — Other Ambulatory Visit (HOSPITAL_COMMUNITY): Payer: Self-pay

## 2024-05-09 ENCOUNTER — Other Ambulatory Visit: Payer: Self-pay

## 2024-05-23 ENCOUNTER — Other Ambulatory Visit: Payer: Self-pay

## 2024-05-23 ENCOUNTER — Other Ambulatory Visit (HOSPITAL_COMMUNITY): Payer: Self-pay

## 2024-06-12 ENCOUNTER — Ambulatory Visit (HOSPITAL_BASED_OUTPATIENT_CLINIC_OR_DEPARTMENT_OTHER): Admitting: Cardiology
